# Patient Record
Sex: Male | Born: 1960
Health system: Southern US, Community
[De-identification: ages and names within clinical notes are randomized; demographics above are authoritative.]

## PROBLEM LIST (undated history)

## (undated) DIAGNOSIS — K2970 Gastritis, unspecified, without bleeding: Secondary | ICD-10-CM

## (undated) DIAGNOSIS — K589 Irritable bowel syndrome without diarrhea: Secondary | ICD-10-CM

## (undated) DIAGNOSIS — F419 Anxiety disorder, unspecified: Secondary | ICD-10-CM

## (undated) DIAGNOSIS — R7303 Prediabetes: Secondary | ICD-10-CM

## (undated) DIAGNOSIS — D126 Benign neoplasm of colon, unspecified: Secondary | ICD-10-CM

## (undated) DIAGNOSIS — L439 Lichen planus, unspecified: Secondary | ICD-10-CM

## (undated) DIAGNOSIS — J302 Other seasonal allergic rhinitis: Secondary | ICD-10-CM

## (undated) DIAGNOSIS — K5790 Diverticulosis of intestine, part unspecified, without perforation or abscess without bleeding: Secondary | ICD-10-CM

## (undated) DIAGNOSIS — K829 Disease of gallbladder, unspecified: Secondary | ICD-10-CM

## (undated) DIAGNOSIS — G4730 Sleep apnea, unspecified: Secondary | ICD-10-CM

## (undated) DIAGNOSIS — K648 Other hemorrhoids: Secondary | ICD-10-CM

## (undated) DIAGNOSIS — E785 Hyperlipidemia, unspecified: Secondary | ICD-10-CM

## (undated) DIAGNOSIS — C61 Malignant neoplasm of prostate: Secondary | ICD-10-CM

## (undated) HISTORY — DX: Other hemorrhoids: K64.8

## (undated) HISTORY — DX: Sleep apnea, unspecified: G47.30

## (undated) HISTORY — DX: Hyperlipidemia, unspecified: E78.5

## (undated) HISTORY — PX: TONSILLECTOMY: SUR1361

## (undated) HISTORY — PX: COLONOSCOPY: SHX5424

## (undated) HISTORY — DX: Diverticulosis of intestine, part unspecified, without perforation or abscess without bleeding: K57.90

## (undated) HISTORY — DX: Gastritis, unspecified, without bleeding: K29.70

## (undated) HISTORY — PX: SEPTOPLASTY: SUR1290

## (undated) HISTORY — DX: Benign neoplasm of colon, unspecified: D12.6

## (undated) HISTORY — DX: Anxiety disorder, unspecified: F41.9

## (undated) HISTORY — DX: Disease of gallbladder, unspecified: K82.9

## (undated) HISTORY — PX: OTHER SURGICAL HISTORY: SHX169

## (undated) HISTORY — DX: Lichen planus, unspecified: L43.9

## (undated) HISTORY — DX: Irritable bowel syndrome, unspecified: K58.9

---

## 1997-12-16 ENCOUNTER — Ambulatory Visit (HOSPITAL_COMMUNITY): Admission: RE | Admit: 1997-12-16 | Discharge: 1997-12-16 | Payer: Self-pay | Admitting: Internal Medicine

## 1997-12-16 ENCOUNTER — Encounter: Payer: Self-pay | Admitting: Internal Medicine

## 1999-11-10 ENCOUNTER — Ambulatory Visit (HOSPITAL_COMMUNITY): Admission: RE | Admit: 1999-11-10 | Discharge: 1999-11-10 | Payer: Self-pay | Admitting: Internal Medicine

## 1999-11-10 ENCOUNTER — Encounter: Payer: Self-pay | Admitting: Internal Medicine

## 2001-04-01 ENCOUNTER — Ambulatory Visit (HOSPITAL_COMMUNITY): Admission: RE | Admit: 2001-04-01 | Discharge: 2001-04-01 | Payer: Self-pay | Admitting: Internal Medicine

## 2001-04-01 ENCOUNTER — Encounter: Payer: Self-pay | Admitting: Internal Medicine

## 2001-09-29 ENCOUNTER — Encounter: Admission: RE | Admit: 2001-09-29 | Discharge: 2001-09-29 | Payer: Self-pay | Admitting: Internal Medicine

## 2001-09-29 ENCOUNTER — Encounter: Payer: Self-pay | Admitting: Internal Medicine

## 2001-10-08 ENCOUNTER — Encounter: Payer: Self-pay | Admitting: Internal Medicine

## 2001-10-08 ENCOUNTER — Encounter: Admission: RE | Admit: 2001-10-08 | Discharge: 2001-10-08 | Payer: Self-pay | Admitting: Internal Medicine

## 2001-10-24 ENCOUNTER — Encounter: Payer: Self-pay | Admitting: Gastroenterology

## 2001-10-24 ENCOUNTER — Ambulatory Visit (HOSPITAL_COMMUNITY): Admission: RE | Admit: 2001-10-24 | Discharge: 2001-10-24 | Payer: Self-pay | Admitting: Gastroenterology

## 2001-10-24 ENCOUNTER — Encounter (INDEPENDENT_AMBULATORY_CARE_PROVIDER_SITE_OTHER): Payer: Self-pay | Admitting: Specialist

## 2001-10-24 HISTORY — PX: UPPER GASTROINTESTINAL ENDOSCOPY: SHX188

## 2001-11-03 ENCOUNTER — Encounter: Payer: Self-pay | Admitting: Gastroenterology

## 2001-11-03 ENCOUNTER — Ambulatory Visit (HOSPITAL_COMMUNITY): Admission: RE | Admit: 2001-11-03 | Discharge: 2001-11-03 | Payer: Self-pay | Admitting: Gastroenterology

## 2002-04-05 ENCOUNTER — Emergency Department (HOSPITAL_COMMUNITY): Admission: EM | Admit: 2002-04-05 | Discharge: 2002-04-05 | Payer: Self-pay | Admitting: Emergency Medicine

## 2003-12-16 ENCOUNTER — Ambulatory Visit: Payer: Self-pay | Admitting: Internal Medicine

## 2004-01-24 ENCOUNTER — Ambulatory Visit: Payer: Self-pay | Admitting: Internal Medicine

## 2004-02-14 ENCOUNTER — Ambulatory Visit: Payer: Self-pay | Admitting: Internal Medicine

## 2004-05-01 ENCOUNTER — Ambulatory Visit: Payer: Self-pay | Admitting: Internal Medicine

## 2005-01-01 ENCOUNTER — Ambulatory Visit: Payer: Self-pay | Admitting: Internal Medicine

## 2005-01-16 ENCOUNTER — Ambulatory Visit: Payer: Self-pay | Admitting: Internal Medicine

## 2005-01-25 ENCOUNTER — Ambulatory Visit: Payer: Self-pay | Admitting: Internal Medicine

## 2005-01-26 ENCOUNTER — Ambulatory Visit: Payer: Self-pay | Admitting: Internal Medicine

## 2005-02-06 ENCOUNTER — Ambulatory Visit: Payer: Self-pay | Admitting: *Deleted

## 2005-04-17 ENCOUNTER — Ambulatory Visit: Payer: Self-pay | Admitting: Internal Medicine

## 2005-04-20 ENCOUNTER — Ambulatory Visit: Payer: Self-pay | Admitting: Internal Medicine

## 2005-05-28 ENCOUNTER — Ambulatory Visit: Payer: Self-pay | Admitting: Internal Medicine

## 2006-02-05 DIAGNOSIS — D126 Benign neoplasm of colon, unspecified: Secondary | ICD-10-CM

## 2006-02-05 HISTORY — DX: Benign neoplasm of colon, unspecified: D12.6

## 2006-02-25 ENCOUNTER — Encounter: Admission: RE | Admit: 2006-02-25 | Discharge: 2006-02-25 | Payer: Self-pay | Admitting: Internal Medicine

## 2006-02-25 ENCOUNTER — Ambulatory Visit: Payer: Self-pay | Admitting: Internal Medicine

## 2006-05-28 ENCOUNTER — Ambulatory Visit: Payer: Self-pay | Admitting: Internal Medicine

## 2006-07-03 ENCOUNTER — Ambulatory Visit: Payer: Self-pay | Admitting: Internal Medicine

## 2006-07-03 ENCOUNTER — Encounter: Payer: Self-pay | Admitting: Internal Medicine

## 2006-07-03 LAB — CONVERTED CEMR LAB
ALT: 28 units/L (ref 0–40)
AST: 19 units/L (ref 0–37)
Albumin: 4 g/dL (ref 3.5–5.2)
Alkaline Phosphatase: 43 units/L (ref 39–117)
BUN: 13 mg/dL (ref 6–23)
Basophils Absolute: 0 10*3/uL (ref 0.0–0.1)
Basophils Relative: 0.7 % (ref 0.0–1.0)
Bilirubin, Direct: 0.1 mg/dL (ref 0.0–0.3)
CO2: 29 meq/L (ref 19–32)
Calcium: 9 mg/dL (ref 8.4–10.5)
Chloride: 107 meq/L (ref 96–112)
Cholesterol: 174 mg/dL (ref 0–200)
Creatinine, Ser: 0.8 mg/dL (ref 0.4–1.5)
Eosinophils Absolute: 0.1 10*3/uL (ref 0.0–0.6)
Eosinophils Relative: 1.6 % (ref 0.0–5.0)
GFR calc Af Amer: 133 mL/min
GFR calc non Af Amer: 110 mL/min
Glucose, Bld: 100 mg/dL — ABNORMAL HIGH (ref 70–99)
HCT: 43.8 % (ref 39.0–52.0)
HDL: 37.1 mg/dL — ABNORMAL LOW (ref >39.0)
Hemoglobin: 15.5 g/dL (ref 13.0–17.0)
Hgb A1c MFr Bld: 5.6 % (ref 4.6–6.0)
LDL Cholesterol: 114 mg/dL — ABNORMAL HIGH (ref 0–99)
Lymphocytes Relative: 29.1 % (ref 12.0–46.0)
MCHC: 35.4 g/dL (ref 30.0–36.0)
MCV: 90.7 fL (ref 78.0–100.0)
Monocytes Absolute: 0.4 10*3/uL (ref 0.2–0.7)
Monocytes Relative: 7.2 % (ref 3.0–11.0)
Neutro Abs: 3.4 10*3/uL (ref 1.4–7.7)
Neutrophils Relative %: 61.4 % (ref 43.0–77.0)
PSA: 1.86 ng/mL (ref 0.10–4.00)
Platelets: 187 10*3/uL (ref 150–400)
Potassium: 4.1 meq/L (ref 3.5–5.1)
RBC: 4.83 M/uL (ref 4.22–5.81)
RDW: 12.3 % (ref 11.5–14.6)
Sodium: 140 meq/L (ref 135–145)
TSH: 0.74 microintl units/mL (ref 0.35–5.50)
Total Bilirubin: 0.6 mg/dL (ref 0.3–1.2)
Total CHOL/HDL Ratio: 4.7
Total Protein: 6.6 g/dL (ref 6.0–8.3)
Triglycerides: 115 mg/dL (ref 0–149)
VLDL: 23 mg/dL (ref 0–40)
WBC: 5.5 10*3/uL (ref 4.5–10.5)

## 2006-08-01 ENCOUNTER — Ambulatory Visit: Payer: Self-pay | Admitting: Gastroenterology

## 2006-08-06 ENCOUNTER — Ambulatory Visit: Payer: Self-pay | Admitting: Gastroenterology

## 2006-08-12 ENCOUNTER — Encounter: Payer: Self-pay | Admitting: Internal Medicine

## 2006-08-12 ENCOUNTER — Ambulatory Visit: Payer: Self-pay | Admitting: Gastroenterology

## 2006-08-12 ENCOUNTER — Encounter: Payer: Self-pay | Admitting: Gastroenterology

## 2007-02-06 DIAGNOSIS — E785 Hyperlipidemia, unspecified: Secondary | ICD-10-CM

## 2007-02-06 HISTORY — DX: Hyperlipidemia, unspecified: E78.5

## 2007-03-12 ENCOUNTER — Ambulatory Visit: Payer: Self-pay | Admitting: Internal Medicine

## 2007-03-12 DIAGNOSIS — R930 Abnormal findings on diagnostic imaging of skull and head, not elsewhere classified: Secondary | ICD-10-CM

## 2007-03-17 ENCOUNTER — Telehealth (INDEPENDENT_AMBULATORY_CARE_PROVIDER_SITE_OTHER): Payer: Self-pay | Admitting: *Deleted

## 2007-03-21 ENCOUNTER — Encounter (INDEPENDENT_AMBULATORY_CARE_PROVIDER_SITE_OTHER): Payer: Self-pay | Admitting: *Deleted

## 2007-03-21 LAB — CONVERTED CEMR LAB
ALT: 40 units/L (ref 0–53)
Basophils Relative: 0.7 % (ref 0.0–1.0)
Bilirubin, Direct: 0.1 mg/dL (ref 0.0–0.3)
CO2: 30 meq/L (ref 19–32)
Calcium: 9.7 mg/dL (ref 8.4–10.5)
Eosinophils Relative: 1.8 % (ref 0.0–5.0)
GFR calc Af Amer: 102 mL/min
Glucose, Bld: 80 mg/dL (ref 70–99)
HCT: 45.7 % (ref 39.0–52.0)
Hemoglobin: 16.1 g/dL (ref 13.0–17.0)
Lymphocytes Relative: 45.5 % (ref 12.0–46.0)
Monocytes Absolute: 0.5 10*3/uL (ref 0.2–0.7)
Neutro Abs: 2 10*3/uL (ref 1.4–7.7)
Platelets: 211 10*3/uL (ref 150–400)
Total Protein: 7.2 g/dL (ref 6.0–8.3)
WBC: 4.7 10*3/uL (ref 4.5–10.5)

## 2007-03-28 ENCOUNTER — Encounter: Payer: Self-pay | Admitting: Internal Medicine

## 2007-03-29 ENCOUNTER — Encounter: Payer: Self-pay | Admitting: Gastroenterology

## 2007-03-31 ENCOUNTER — Encounter: Payer: Self-pay | Admitting: Internal Medicine

## 2007-05-02 ENCOUNTER — Ambulatory Visit: Payer: Self-pay | Admitting: Internal Medicine

## 2007-05-03 LAB — CONVERTED CEMR LAB: Potassium: 5.1 meq/L (ref 3.5–5.1)

## 2007-05-05 ENCOUNTER — Encounter (INDEPENDENT_AMBULATORY_CARE_PROVIDER_SITE_OTHER): Payer: Self-pay | Admitting: *Deleted

## 2007-05-07 ENCOUNTER — Ambulatory Visit: Payer: Self-pay | Admitting: Internal Medicine

## 2007-05-07 DIAGNOSIS — F411 Generalized anxiety disorder: Secondary | ICD-10-CM | POA: Insufficient documentation

## 2007-05-15 ENCOUNTER — Telehealth (INDEPENDENT_AMBULATORY_CARE_PROVIDER_SITE_OTHER): Payer: Self-pay | Admitting: *Deleted

## 2007-05-21 ENCOUNTER — Ambulatory Visit: Payer: Self-pay | Admitting: Internal Medicine

## 2007-06-07 DIAGNOSIS — K5730 Diverticulosis of large intestine without perforation or abscess without bleeding: Secondary | ICD-10-CM | POA: Insufficient documentation

## 2007-07-15 ENCOUNTER — Ambulatory Visit: Payer: Self-pay | Admitting: Internal Medicine

## 2007-07-15 DIAGNOSIS — G4733 Obstructive sleep apnea (adult) (pediatric): Secondary | ICD-10-CM | POA: Insufficient documentation

## 2007-07-24 ENCOUNTER — Ambulatory Visit: Payer: Self-pay | Admitting: Internal Medicine

## 2007-07-30 ENCOUNTER — Encounter (INDEPENDENT_AMBULATORY_CARE_PROVIDER_SITE_OTHER): Payer: Self-pay | Admitting: *Deleted

## 2007-07-30 LAB — CONVERTED CEMR LAB
ALT: 38 units/L (ref 0–53)
AST: 22 units/L (ref 0–37)
Bilirubin, Direct: 0.1 mg/dL (ref 0.0–0.3)
Total Bilirubin: 0.9 mg/dL (ref 0.3–1.2)

## 2007-12-12 ENCOUNTER — Ambulatory Visit: Payer: Self-pay | Admitting: Internal Medicine

## 2007-12-12 DIAGNOSIS — E781 Pure hyperglyceridemia: Secondary | ICD-10-CM | POA: Insufficient documentation

## 2007-12-18 ENCOUNTER — Ambulatory Visit: Payer: Self-pay | Admitting: Internal Medicine

## 2007-12-30 ENCOUNTER — Encounter (INDEPENDENT_AMBULATORY_CARE_PROVIDER_SITE_OTHER): Payer: Self-pay | Admitting: *Deleted

## 2007-12-30 LAB — CONVERTED CEMR LAB
LDL Cholesterol: 122 mg/dL — ABNORMAL HIGH (ref 0–99)
Total CHOL/HDL Ratio: 5.1
Triglycerides: 120 mg/dL (ref 0–149)
VLDL: 24 mg/dL (ref 0–40)

## 2008-01-21 ENCOUNTER — Telehealth (INDEPENDENT_AMBULATORY_CARE_PROVIDER_SITE_OTHER): Payer: Self-pay | Admitting: *Deleted

## 2008-02-25 ENCOUNTER — Encounter: Payer: Self-pay | Admitting: Internal Medicine

## 2008-05-28 ENCOUNTER — Telehealth (INDEPENDENT_AMBULATORY_CARE_PROVIDER_SITE_OTHER): Payer: Self-pay | Admitting: *Deleted

## 2008-11-22 ENCOUNTER — Encounter: Payer: Self-pay | Admitting: Internal Medicine

## 2008-12-24 ENCOUNTER — Ambulatory Visit: Payer: Self-pay | Admitting: Internal Medicine

## 2009-01-19 ENCOUNTER — Telehealth (INDEPENDENT_AMBULATORY_CARE_PROVIDER_SITE_OTHER): Payer: Self-pay | Admitting: *Deleted

## 2009-02-28 ENCOUNTER — Telehealth (INDEPENDENT_AMBULATORY_CARE_PROVIDER_SITE_OTHER): Payer: Self-pay | Admitting: *Deleted

## 2009-06-08 ENCOUNTER — Ambulatory Visit: Payer: Self-pay | Admitting: Internal Medicine

## 2009-06-08 DIAGNOSIS — E782 Mixed hyperlipidemia: Secondary | ICD-10-CM | POA: Insufficient documentation

## 2009-06-08 DIAGNOSIS — Z86010 Personal history of colonic polyps: Secondary | ICD-10-CM

## 2009-06-08 DIAGNOSIS — R7301 Impaired fasting glucose: Secondary | ICD-10-CM | POA: Insufficient documentation

## 2009-06-08 DIAGNOSIS — M545 Low back pain, unspecified: Secondary | ICD-10-CM | POA: Insufficient documentation

## 2009-06-08 DIAGNOSIS — E785 Hyperlipidemia, unspecified: Secondary | ICD-10-CM

## 2009-06-08 DIAGNOSIS — D239 Other benign neoplasm of skin, unspecified: Secondary | ICD-10-CM | POA: Insufficient documentation

## 2009-06-13 ENCOUNTER — Ambulatory Visit: Payer: Self-pay | Admitting: Internal Medicine

## 2009-06-13 DIAGNOSIS — J3089 Other allergic rhinitis: Secondary | ICD-10-CM

## 2009-06-13 DIAGNOSIS — J302 Other seasonal allergic rhinitis: Secondary | ICD-10-CM

## 2009-06-23 ENCOUNTER — Encounter: Payer: Self-pay | Admitting: Internal Medicine

## 2009-06-27 ENCOUNTER — Encounter: Payer: Self-pay | Admitting: Internal Medicine

## 2009-12-01 ENCOUNTER — Ambulatory Visit: Payer: Self-pay | Admitting: Internal Medicine

## 2009-12-01 DIAGNOSIS — R198 Other specified symptoms and signs involving the digestive system and abdomen: Secondary | ICD-10-CM

## 2009-12-01 DIAGNOSIS — K219 Gastro-esophageal reflux disease without esophagitis: Secondary | ICD-10-CM

## 2009-12-01 LAB — CONVERTED CEMR LAB
Ketones, urine, test strip: NEGATIVE
Specific Gravity, Urine: 1.01

## 2009-12-26 ENCOUNTER — Telehealth (INDEPENDENT_AMBULATORY_CARE_PROVIDER_SITE_OTHER): Payer: Self-pay | Admitting: *Deleted

## 2010-01-03 ENCOUNTER — Ambulatory Visit: Payer: Self-pay | Admitting: Internal Medicine

## 2010-01-03 DIAGNOSIS — L509 Urticaria, unspecified: Secondary | ICD-10-CM

## 2010-01-05 ENCOUNTER — Telehealth: Payer: Self-pay | Admitting: Gastroenterology

## 2010-02-05 DIAGNOSIS — K648 Other hemorrhoids: Secondary | ICD-10-CM

## 2010-02-05 HISTORY — DX: Other hemorrhoids: K64.8

## 2010-02-15 ENCOUNTER — Ambulatory Visit
Admission: RE | Admit: 2010-02-15 | Discharge: 2010-02-15 | Payer: Self-pay | Source: Home / Self Care | Attending: Gastroenterology | Admitting: Gastroenterology

## 2010-02-16 ENCOUNTER — Telehealth: Payer: Self-pay | Admitting: Gastroenterology

## 2010-02-20 ENCOUNTER — Telehealth (INDEPENDENT_AMBULATORY_CARE_PROVIDER_SITE_OTHER): Payer: Self-pay | Admitting: *Deleted

## 2010-03-03 ENCOUNTER — Telehealth (INDEPENDENT_AMBULATORY_CARE_PROVIDER_SITE_OTHER): Payer: Self-pay | Admitting: *Deleted

## 2010-03-05 LAB — CONVERTED CEMR LAB
ALT: 34 units/L (ref 0–53)
AST: 24 units/L (ref 0–37)
Albumin: 4.3 g/dL (ref 3.5–5.2)
Alkaline Phosphatase: 48 units/L (ref 39–117)
Alkaline Phosphatase: 57 units/L (ref 39–117)
Basophils Absolute: 0 10*3/uL (ref 0.0–0.1)
Basophils Relative: 0.4 % (ref 0.0–3.0)
Bilirubin, Direct: 0.1 mg/dL (ref 0.0–0.3)
Bilirubin, Direct: 0.1 mg/dL (ref 0.0–0.3)
CO2: 31 meq/L (ref 19–32)
Calcium: 9 mg/dL (ref 8.4–10.5)
Cholesterol, target level: 200 mg/dL
Creatinine, Ser: 0.8 mg/dL (ref 0.4–1.5)
Eosinophils Absolute: 0.1 10*3/uL (ref 0.0–0.7)
Eosinophils Absolute: 0.1 10*3/uL (ref 0.0–0.7)
Eosinophils Relative: 1.3 % (ref 0.0–5.0)
GFR calc Af Amer: 115 mL/min
GFR calc non Af Amer: 95 mL/min
HCT: 42.6 % (ref 39.0–52.0)
HDL: 50.1 mg/dL (ref >39.00)
Hemoglobin: 14.9 g/dL (ref 13.0–17.0)
LDL Cholesterol: 101 mg/dL — ABNORMAL HIGH (ref 0–99)
LDL Goal: 130 mg/dL
Lymphocytes Relative: 32.6 % (ref 12.0–46.0)
MCHC: 34.9 g/dL (ref 30.0–36.0)
MCHC: 35 g/dL (ref 30.0–36.0)
MCV: 91.6 fL (ref 78.0–100.0)
Monocytes Absolute: 0.5 10*3/uL (ref 0.1–1.0)
Monocytes Relative: 7 % (ref 3.0–12.0)
Monocytes Relative: 8.2 % (ref 3.0–12.0)
Neutro Abs: 3.7 10*3/uL (ref 1.4–7.7)
Neutrophils Relative %: 58.7 % (ref 43.0–77.0)
Platelets: 212 10*3/uL (ref 150–400)
Potassium: 4.5 meq/L (ref 3.5–5.1)
RBC: 4.6 M/uL (ref 4.22–5.81)
RDW: 12.2 % (ref 11.5–14.6)
Sodium: 139 meq/L (ref 135–145)
Sodium: 142 meq/L (ref 135–145)
TSH: 0.92 microintl units/mL (ref 0.35–5.50)
Total Bilirubin: 0.9 mg/dL (ref 0.3–1.2)
Total CHOL/HDL Ratio: 3
Total Protein: 6.6 g/dL (ref 6.0–8.3)
WBC: 6.3 10*3/uL (ref 4.5–10.5)

## 2010-03-07 NOTE — Progress Notes (Signed)
Summary: Triage   Phone Note Call from Patient Call back at Work Phone [phone]   Call For: Dr. Arlyce Dice Reason for Call: Talk to Nurse Summary of Call: Pt wants to be seen sooner than January, he is having abd pain in left side, blood in stool, bloating, and gerd, has been to primary doctor twice for these problems with no relief Initial call taken by: [place] Johnson,  January 05, 2010 9:41 AM  Follow-up for Phone Call        Spoke with patient, he c/o some acid reflux, GERD, bloating, and some stool incontinence. Would like a sooner appointment with Arlyce Dice. Informed patient that we did not have a sooner appointment. Offered an appointment with the mid-level but patient declined. States he will wait to see Dr. Arlyce Dice. Would like to be placed on the wait list. Selinda Michaels RN  January 05, 2010 11:17 AM

## 2010-03-07 NOTE — Assessment & Plan Note (Signed)
Summary: fungus-toes/ibs??/cbs   Vital Signs:  Patient profile:   50 year old male Weight:      253.2 pounds Temp:     99.2 degrees F oral Pulse rate:   84 / minute Resp:     16 per minute BP sitting:   118 / 80  (left arm) Cuff size:   large  Vitals Entered By: Shonna Chock (Jun 08, 2009 4:34 PM) CC: 1.) Fungus-toe nail x a long time (patient unable to remember how long)  2.)Pain in left buttocks x 6 months  3.)? IBS- paitent states it feels like his colon hurts (off/on x several weeks) Comments REVIEWED MED LIST, PATIENT AGREED DOSE AND INSTRUCTION CORRECT    CC:  1.) Fungus-toe nail x a long time (patient unable to remember how long)  2.)Pain in left buttocks x 6 months  3.)? IBS- paitent states it feels like his colon hurts (off/on x several weeks).  History of Present Illness: ? fungal changes in L great toenail ? x 2 years following trauma with loss of pior  nail. PMH oflocal surgery by  Podiatrist for ingrown toe nails. Old chart reviewed ; mild fasting hyperglycemia. No FH of DM. PMH of ESI X 1 for LBP 2009.LBP has recurred @ R LS area , worse positionally.  Allergies: 1)  ! Pcn 2)  ! Sulfa 3)  ! Cipro 4)  ! 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 GU:  Denies incontinence. Derm:  Denies poor wound healing. Neuro:  Denies numbness and tingling; No radicular pain. Endo:  Denies excessive hunger, excessive thirst, and excessive urination.  Physical Exam  General:  well-nourished,in no acute distress; alert,appropriate and cooperative  throughout examination Pulses:  dorsalis pedis and posterior tibial pulses are full and equal bilaterally Extremities:  No clubbing, cyanosis, edema. Discolored L great toenail. Neg SLR Neurologic:  strength normal in all extremities, gait normal, and DTRs symmetrical and normal.   Skin:  Intact without suspicious lesions or rashes   Impression & Recommendations:  Problem # 1:  BENIGN NEOPLASM OF SKIN SITE UNSPECIFIED (ICD-216.9)   post traumatic loss of nail; discoloration of new nail, R/O fungal etiology  Orders: Podiatry Referral (Podiatry)  Problem # 2:  LOW BACK PAIN SYNDROME (ICD-724.2)  recurrent  His updated medication list for this problem includes:    Cyclobenzaprine Hcl 5 Mg Tabs (Cyclobenzaprine hcl) .Marland Kitchen... 1-2 at bedtime as needed back pain  Problem # 3:  HYPERGLYCEMIA, FASTING (ICD-790.29) A1c normal  Complete Medication List: 1)  Claritin 10 Mg Tabs (Loratadine) .Marland Kitchen.. 1 once daily as needed 2)  Lorazepam 0.5 Mg Tabs (Lorazepam) .Marland Kitchen.. 1 q 8 hrs as needed stress 3)  Effexor Xr 150 Mg Xr24h-cap (Venlafaxine hcl) .Marland Kitchen.. 1 by mouth once daily 4)  Cyclobenzaprine Hcl 5 Mg Tabs (Cyclobenzaprine hcl) .Marland Kitchen.. 1-2 at bedtime as needed back pain 5)  Gabapentin 100 Mg Caps (Gabapentin) .Marland Kitchen.. 1 every 8 hrs as needed for nerve pain  Patient Instructions: 1)  Consider stretching exercises as discussed Prescriptions: GABAPENTIN 100 MG CAPS (GABAPENTIN) 1 every 8 hrs as needed for nerve pain  #30 x 0   Entered and Authorized by:  Marga Melnick MD   Signed by:   Marga Melnick MD on 06/08/2009   Method used:   Faxed to ...       [facility]  [address] [phone][facility] (retail)       [address], [place]  [address]       Ph: [phone] or [phone]       Fax: [phone]   RxID:   [id number] CYCLOBENZAPRINE HCL 5 MG TABS (CYCLOBENZAPRINE HCL) 1-2 at bedtime as needed back pain  #20 x 0   Entered and Authorized by:   Marga Melnick MD   Signed by:   Marga Melnick  MD on 06/08/2009   Method used:   Faxed to ...       [facility]  [address] [phone]* (retail)       [address]       [place], [place]  [address]       Ph: [phone] or [phone]       Fax: [phone]   RxID:   [id number]

## 2010-03-07 NOTE — Medication Information (Signed)
Summary: Generic Request for Effexor/[facility]  Generic Request for Effexor/[facility]   Imported By: Lanelle Bal 06/30/2009 11:21:24  _____________________________________________________________________  External Attachment:    Type:   Image     Comment:   External Document

## 2010-03-07 NOTE — Progress Notes (Signed)
Summary: refill  Phone Note Refill Request Message from:  Fax from Pharmacy on December 26, 2009 11:30 AM  Refills Requested: Medication #1:  LORAZEPAM 0.5 MG  TABS 1 q 8 hrs as needed [facility] [phone] - tel [phone];  Initial call taken by: Okey Regal [place],  December 26, 2009 11:31 AM    Prescriptions: LORAZEPAM 0.5 MG  TABS (LORAZEPAM) 1 q 8 hrs as needed stress  #30 x 1   Entered by:   Shonna Chock CMA   Authorized by:   Marga Melnick MD   Signed by:   Shonna Chock CMA on 12/26/2009   Method used:   Printed then faxed to ...       [facility]  [address] [phone][facility] (retail)       [address]       [place], [place]  [address]       Ph: [phone] or [phone]       Fax: [phone]   RxID:   [id number]

## 2010-03-07 NOTE — Assessment & Plan Note (Signed)
Summary: cpx - lab/cbs   Vital Signs:  Patient profile:   50 year old male Height:      72.5 inches Weight:      259.4 pounds Temp:     98.9 degrees F oral Pulse rate:   76 / minute Resp:     14 per minute BP sitting:   104 / 80  (left arm) Cuff size:   large  Vitals Entered By: Shonna Chock (Jun 13, 2009 8:17 AM)  CC: Lipid Management Comments REVIEWED MED LIST, PATIENT AGREED DOSE AND INSTRUCTION CORRECT    CC:  Lipid Management.  History of Present Illness: Russell Thomas is here for a physical; back symptoms better with Gabapentin & generic Flexeril.  Lipid Management History:      Positive NCEP/ATP III risk factors include male age 14 years old or older, HDL cholesterol less than 40, and current tobacco user.  Negative NCEP/ATP III risk factors include non-diabetic, no family history for ischemic heart disease, non-hypertensive, no ASHD (atherosclerotic heart disease), no prior stroke/TIA, no peripheral vascular disease, and no history of aortic aneurysm.     Preventive Screening-Counseling & Management  Alcohol-Tobacco     Smoking Status: current  Caffeine-Diet-Exercise     Does Patient Exercise: no  Allergies: 1)  ! Pcn 2)  ! Sulfa 3)  ! Cipro 4)  ! 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) ,EMPTY SELLA SYNDROME GALL BLADDER POLYPS; FASTING HYPERGLYCEMIA (790.29) Hyperlipidemia: LDL goal = < 130 Colonic polyps, hx of Allergic rhinitis Diverticulosis, colon  Past Surgical History: septoplasty;thumb surgery Colon polypectomy 2008, Dr Arlyce Dice , due 2013 Upper Endoscopy :chronic  gastritis 2003, Dr Arlyce Dice Tonsillectomy  Family History: Father: negative Mother: thyroid disease Siblings: negative; no premature CAD  Social History: Occupation: Administrator, sports Married Current Smoker: rare cigar Alcohol use-yes: occasion wine or beer Regular exercise-no Smoking  Status:  current Does Patient Exercise:  no  Review of Systems  The patient denies anorexia, fever, vision loss, decreased hearing, chest pain, syncope, dyspnea on exertion, peripheral edema, prolonged cough, headaches, hemoptysis, abdominal pain, melena, hematochezia, severe indigestion/heartburn, suspicious skin lesions, unusual weight change, abnormal bleeding, enlarged lymph nodes, and angioedema.         Weight up 12#. GU:  Denies decreased libido, discharge, dysuria, erectile dysfunction, and hematuria. MS:  Denies muscle weakness. Psych:  Denies anxiety, depression, easily angered, easily tearful, and irritability; Some previous  stress with  job change , now stable. He wishes to wean effexor. Allergy:  Complains of itching eyes, seasonal allergies, and sneezing; Also hoarseness seasonally.  Physical Exam  General:  well-nourished; alert,appropriate and cooperative throughout examination Head:  Normocephalic and atraumatic without obvious abnormalities. No apparent alopecia  Eyes:  No corneal or conjunctival inflammation noted. EOMI. Perrla. Field of  Vision grossly normal. Ears:  External ear exam shows no significant lesions or deformities.  Otoscopic examination reveals clear canals, tympanic membranes are intact bilaterally without bulging, retraction, inflammation or discharge. Hearing is grossly normal bilaterally.Some wax on L Nose:  External nasal examination shows no deformity or inflammation. Nasal mucosa are pink and moist without lesions or exudates. Septum slightly to R Mouth:  Oral mucosa and oropharynx without lesions or exudates.  Teeth in good repair. Neck:  No deformities, masses, or tenderness noted. Lungs:  Normal respiratory effort, chest expands symmetrically. Lungs are clear to auscultation, no crackles or wheezes. Heart:  Normal rate and regular  rhythm. S1 and S2 normal without gallop, murmur, click, rub .Heart sounds slightly distant Abdomen:  Bowel sounds  positive,abdomen soft and non-tender without masses, organomegaly or hernias noted. Rectal:  No external abnormalities noted. Normal sphincter tone. No rectal masses or tenderness. Genitalia:  Testes bilaterally descended without nodularity, tenderness or masses. No scrotal masses or lesions. No penis lesions or urethral discharge. Prostate:  Prostate gland firm and smooth, no enlargement, nodularity, tenderness, mass, asymmetry or induration. Msk:  No deformity or scoliosis noted of thoracic or lumbar spine.   Pulses:  R and L carotid,radial,dorsalis pedis and posterior tibial pulses are full and equal bilaterally Extremities:  No clubbing, cyanosis, edema, or deformity noted with normal full range of motion of all joints.   Crepitus of knees Neurologic:  alert & oriented X3 and DTRs symmetrical and normal.   Skin:  Intact without suspicious lesions or rashes Cervical Nodes:  No lymphadenopathy noted Axillary Nodes:  No palpable lymphadenopathy Inguinal Nodes:  No significant adenopathy Psych:  memory intact for recent and remote, normally interactive, and good eye contact.     Impression & Recommendations:  Problem # 1:  ROUTINE GENERAL MEDICAL EXAM@[email or web address]  CARE FACL (ICD-V70.0)  Orders: EKG w/ Interpretation (93000) Venipuncture (16109) TLB-Lipid Panel (80061-LIPID) TLB-BMP (Basic Metabolic Panel-BMET) (80048-METABOL) TLB-CBC Platelet - w/Differential (85025-CBCD) TLB-Hepatic/Liver Function Pnl (80076-HEPATIC) TLB-TSH (Thyroid Stimulating Hormone) (84443-TSH) TLB-PSA (Prostate Specific Antigen) (84153-PSA)  Problem # 2:  LOW BACK PAIN SYNDROME (ICD-724.2) improved His updated medication list for this problem includes:    Cyclobenzaprine Hcl 5 Mg Tabs (Cyclobenzaprine hcl) .Marland Kitchen... 1-2 at bedtime as needed back pain  Problem # 3:  HYPERLIPIDEMIA (ICD-272.4)  Orders: EKG w/ Interpretation (93000) Venipuncture (60454) TLB-Lipid Panel (80061-LIPID)  Problem # 4:  SLEEP APNEA  (ICD-780.57) controlled  Problem # 5:  COLONIC POLYPS, HX OF (ICD-V12.72) as per Dr Arlyce Dice  Problem # 6:  ANXIETY STATE, UNSPECIFIED (ICD-300.00) Stable His updated medication list for this problem includes:    Lorazepam 0.5 Mg Tabs (Lorazepam) .Marland Kitchen... 1 q 8 hrs as needed stress  Problem # 7:  SPECIAL SCREENING MALIGNANT NEOPLASM OF PROSTATE (ICD-V76.44)  Orders: Venipuncture (09811) TLB-PSA (Prostate Specific Antigen) (84153-PSA)  Complete Medication List: 1)  Claritin 10 Mg Tabs (Loratadine) .Marland Kitchen.. 1 once daily as needed 2)  Lorazepam 0.5 Mg Tabs (Lorazepam) .Marland Kitchen.. 1 q 8 hrs as needed stress 3)  Effexor Xr 75 Mg Xr24h-cap (venlafaxine Hcl)  .Marland Kitchen.. 1 by mouth once daily 4)  Cyclobenzaprine Hcl 5 Mg Tabs (Cyclobenzaprine hcl) .Marland Kitchen.. 1-2 at bedtime as needed back pain 5)  Gabapentin 100 Mg Caps (Gabapentin) .Marland Kitchen.. 1 every 8 hrs as needed for nerve pain  Lipid Assessment/Plan:      Based on NCEP/ATP III, the patient's risk factor category is "2 or more risk factors and a calculated 10 year CAD risk of < 20%".  The patient's lipid goals are as follows: Total cholesterol goal is 200; LDL cholesterol goal is 130; HDL cholesterol goal is 40; Triglyceride goal is 150.  His LDL cholesterol goal has been met.    Patient Instructions: 1)  Your LDL goal = < 130. Vitamin D3 1000 IU once daily . Prescriptions: EFFEXOR XR 75  MG XR24H-CAP (VENLAFAXINE HCL) 1 by mouth once daily  #30 x 5   Entered and Authorized by:   Marga Melnick MD   Signed by:   Marga Melnick MD on 06/13/2009   Method used:   Print then Give to Patient   RxID:   [id number]  Appended Document: cpx - lab/cbs  Laboratory Results   Urine Tests   Date/Time Reported: Jun 13, 2009 10:47 AM   Routine Urinalysis   Color: yellow Appearance: Clear Glucose: negative   (Normal Range: Negative) Bilirubin: negative   (Normal Range: Negative) Ketone: negative   (Normal Range: Negative) Spec. Gravity: 1.015   (Normal  Range: 1.003-1.035) Blood: negative   (Normal Range: Negative) pH: 7.0   (Normal Range: 5.0-8.0) Protein: negative   (Normal Range: Negative) Urobilinogen: negative   (Normal Range: 0-1) Nitrite: negative   (Normal Range: Negative) Leukocyte Esterace: negative   (Normal Range: Negative)    Comments: Floydene Flock  Jun 13, 2009 10:47 AM

## 2010-03-07 NOTE — Assessment & Plan Note (Signed)
Summary: STOMACH PROBLEMS, LL BACK HURTS FOR WEEKS///[facility]   Vital Signs:  Patient profile:   50 year old male Weight:      261.6 pounds BMI:     35.12 Temp:     98.5 degrees F oral Pulse rate:   64 / minute Resp:     15 per minute BP sitting:   124 / 80  (left arm) Cuff size:   large  Vitals Entered By: Shonna Chock CMA (December 01, 2009 4:12 PM) CC: 1.) Stomach discomfort- left lower area and diarrhea   2.) Left lower back pain and frequent urination , Back pain, Abdominal pain   CC:  1.) Stomach discomfort- left lower area and diarrhea   2.) Left lower back pain and frequent urination , Back pain, and Abdominal pain.  History of Present Illness: Back Pain      This is a 50 year old man who presents with Back pain for > 6 months.  The patient reports rest pain, but denies fever, chills, weakness, loss of sensation, fecal incontinence, urinary incontinence, urinary retention. He has had polyuria & some  dysuria.  The pain is located in the left low back.  The pain began gradually and w/o injury  The pain radiates to the left  inguinal area.  The pain  has no triggers or relievers . ESI X 2 in 2008 helped , but ESI  8 weeks  ago of no benefit. Risk factors for serious underlying conditions include duration of pain > 1 month and age >= 52 years.  Rx: NSAIDS  but GI intolerance. Abdominal Pain      The patient also presents with Abdominal pain for 4 weeks.  The patient reports loose stool &  diarrhea and  some anorexia, but denies nausea, vomiting, constipation, melena, hematochezia, and hematemesis.  The location of the pain is left upper quadrant.  The pain is described as intermittent, dull, and radiating to the back.  The patient denies the following symptoms: weight loss, jaundice, and dark urine.  The pain is better with an H2 blocker. EGD 2003 2003 : gastritis, Dr Arlyce Dice.   Current Medications (verified): 1)  Claritin 10 Mg Tabs (Loratadine) .Marland Kitchen.. 1 Once Daily As Needed 2)  Lorazepam  0.5 Mg  Tabs (Lorazepam) .Marland Kitchen.. 1 Q 8 Hrs As Needed Stress 3)  Effexor Xr 75  Mg Xr24h-Cap (Venlafaxine Hcl) .Marland Kitchen.. 1 By Mouth Once Daily 4)  Cyclobenzaprine Hcl 5 Mg Tabs (Cyclobenzaprine Hcl) .Marland Kitchen.. 1-2 At Bedtime As Needed Back Pain 5)  Gabapentin 100 Mg Caps (Gabapentin) .Marland Kitchen.. 1 Every 8 Hrs As Needed For Nerve Pain 6)  Pepcid Ac 10 Mg Tabs (Famotidine) .Marland Kitchen.. 1 By Mouth Once Daily As Needed  Allergies: 1)  ! Pcn 2)  ! Sulfa 3)  ! Cipro 4)  ! * Cephlosporins  Physical Exam  General:  in no acute distress; alert,appropriate and cooperative throughout examination Eyes:  No corneal or conjunctival inflammation noted. No icterus Mouth:  Oral mucosa and oropharynx without lesions or exudates.  Teeth in good repair. No pharyngeal erythema.   Lungs:  Normal respiratory effort, chest expands symmetrically. Lungs are clear to auscultation, no crackles or wheezes. Heart:  Normal rate and regular rhythm. S1 and S2 normal without gallop, murmur, click, rub or other extra sounds. Abdomen:  Bowel sounds positive,abdomen soft and non-tender without masses, organomegaly or hernias noted. Msk:  He lay back & sat up w/o help Pulses:  R and L carotid,radial,dorsalis pedis and posterior  tibial pulses are full and equal bilaterally Extremities:  No clubbing, cyanosis, edema, or deformity noted with normal full range of motion of all joints. Neg SLR    Neurologic:  alert & oriented X3, strength normal in all extremities, and DTRs symmetrical and normal.   Skin:  Intact without suspicious lesions or rashes Cervical Nodes:  No lymphadenopathy noted Axillary Nodes:  No palpable lymphadenopathy Psych:  memory intact for recent and remote, normally interactive, and good eye contact.     Impression & Recommendations:  Problem # 1:  LOW BACK PAIN SYNDROME (ICD-724.2)  His updated medication list for this problem includes:    Cyclobenzaprine Hcl 5 Mg Tabs (Cyclobenzaprine hcl) .Marland Kitchen... 1-2 at bedtime as needed back  pain    Tramadol Hcl 50 Mg Tabs (Tramadol hcl) .Marland Kitchen... 1 every 6 hrs as needed for pain  Problem # 2:  ABDOMINAL PAIN, LEFT UPPER QUADRANT (ICD-789.02) PMH of gastritis  Problem # 3:  GERD (ICD-530.81)  His updated medication list for this problem includes:    Pepcid Ac 10 Mg Tabs (Famotidine) .Marland Kitchen... 1 by mouth once daily as needed    Omeprazole 20 Mg Cpdr (Omeprazole) .Marland Kitchen... 1 two times a day pre meals  Problem # 4:  CHANGE IN BOWELS (ICD-787.99) loose stool  Complete Medication List: 1)  Claritin 10 Mg Tabs (Loratadine) .Marland Kitchen.. 1 once daily as needed 2)  Lorazepam 0.5 Mg Tabs (Lorazepam) .Marland Kitchen.. 1 q 8 hrs as needed stress 3)  Cyclobenzaprine Hcl 5 Mg Tabs (Cyclobenzaprine hcl) .Marland Kitchen.. 1-2 at bedtime as needed back pain 4)  Gabapentin 100 Mg Caps (Gabapentin) .Marland Kitchen.. 1 every 8 hrs as needed for nerve pain 5)  Pepcid Ac 10 Mg Tabs (Famotidine) .Marland Kitchen.. 1 by mouth once daily as needed 6)  Tramadol Hcl 50 Mg Tabs (Tramadol hcl) .Marland Kitchen.. 1 every 6 hrs as needed for pain 7)  Omeprazole 20 Mg Cpdr (Omeprazole) .Marland Kitchen.. 1 two times a day pre meals  Other Orders: UA Dipstick w/o Micro (manual) (16109)  Patient Instructions: 1)  Align once daily if bowels are loose.Report symptom response  in 4 days  2)  Avoid foods high in acid (tomatoes, citrus juices, spicy foods). Avoid eating within two hours of lying down or before exercising. Do not over eat; try smaller more frequent meals. Elevate head of bed twelve inches when sleeping. Prescriptions: OMEPRAZOLE 20 MG CPDR (OMEPRAZOLE) 1 two times a day pre meals  #60 x 1   Entered and Authorized by:   Marga Melnick MD   Signed by:   Marga Melnick MD on 12/01/2009   Method used:   Print then Give to Patient   RxID:   [id number] TRAMADOL HCL 50 MG TABS (TRAMADOL HCL) 1 every 6 hrs as needed for pain  #30 x 1   Entered and Authorized by:   Marga Melnick MD   Signed by:   Marga Melnick MD on 12/01/2009   Method used:   Print then Give to Patient   RxID:    [id number]    Orders Added: 1)  UA Dipstick w/o Micro (manual) [81002] 2)  Est. Patient Level IV [29528]    Laboratory Results   Urine Tests    Routine Urinalysis   Color: yellow Appearance: Clear Glucose: negative   (Normal Range: Negative) Bilirubin: negative   (Normal Range: Negative) Ketone: negative   (Normal Range: Negative) Spec. Gravity: 1.010   (Normal Range: 1.003-1.035) Blood: negative   (Normal Range: Negative) pH: 7.0   (Normal Range:  5.0-8.0) Protein: negative   (Normal Range: Negative) Urobilinogen: 0.2   (Normal Range: 0-1) Nitrite: negative   (Normal Range: Negative) Leukocyte Esterace: negative   (Normal Range: Negative)

## 2010-03-07 NOTE — Assessment & Plan Note (Signed)
Summary: ABD & BACK PAIN NO BETTER/RH......Marland Kitchen   Vital Signs:  Patient profile:   50 year old male Weight:      263.0 pounds BMI:     35.31 Temp:     98.4 degrees F oral Pulse rate:   72 / minute Resp:     17 per minute BP sitting:   122 / 76  (left arm) Cuff size:   large  Vitals Entered By: Shonna Chock CMA (January 03, 2010 8:57 AM) CC: Ongoing abdominal and back pain. New concerns: Bright red blood with BM yesterday x 1 , patient had BM today and no blood . Patient was seen on 12/25/2009 in the emergency room for rash-? related to Omeprazole (changed to ranitidine), Abdominal pain, Rash Comments Patient's father was DX with Ulcerative Colitis-early 70's    CC:  Ongoing abdominal and back pain. New concerns: Bright red blood with BM yesterday x 1 , patient had BM today and no blood . Patient was seen on 12/25/2009 in the emergency room for rash-? related to Omeprazole (changed to ranitidine), Abdominal pain, and Rash.  History of Present Illness: Abdominal Pain      This is a 50 year old man who presents with Abdominal pain.  The patient denies nausea, vomiting, diarrhea, constipation, melena, and anorexia. He had blood on stool ,on tissue & in toilet water 11/28, not today. The location of the pain is left upper quadrant.  The pain is described as dull to sharp &  cramping in quality, and radiating to the back.  The patient denies the following symptoms: fever, weight loss, dysuria, chest pain, jaundice, and dark urine.  The pain is worse with food, no specific food  trigger .  The pain is better with an H2 blocker.    Rash      The patient also  had a rash 11/20 for which he was given steroid injection @  [facility] in [place]. PPI (Omeprazole ) was changed to Ranitidine 150 mg  twice a day. Hydroxyzine was also Rxed.  The patient  describes the rash as  hives, welts, and itching, but  he denied  blisters, weeping, and oozing.  The rash is located on the neck, groin, thorax  and  both  hands.  The patient denies the following symptoms: facial swelling, tongue swelling, and arthralgias.    Current Medications (verified): 1)  Claritin 10 Mg Tabs (Loratadine) .Marland Kitchen.. 1 Once Daily As Needed 2)  Lorazepam 0.5 Mg  Tabs (Lorazepam) .Marland Kitchen.. 1 Q 8 Hrs As Needed Stress 3)  Cyclobenzaprine Hcl 5 Mg Tabs (Cyclobenzaprine Hcl) .Marland Kitchen.. 1-2 At Bedtime As Needed Back Pain 4)  Gabapentin 100 Mg Caps (Gabapentin) .Marland Kitchen.. 1 Every 8 Hrs As Needed For Nerve Pain 5)  Tramadol Hcl 50 Mg Tabs (Tramadol Hcl) .Marland Kitchen.. 1 Every 6 Hrs As Needed For Pain 6)  Ranitidine Hcl 150 Mg Tabs (Ranitidine Hcl) .Marland Kitchen.. 1 By Mouth Every 12 Hours X 14 Days 7)  Prednisone 20 Mg Tabs (Prednisone) .... 2 By Mouth Once Daily X 5days (Started On 01/01/2010) 8)  Hydroxyzine Pamoate 50 Mg Caps (Hydroxyzine Pamoate) .Marland Kitchen.. 1 By Mouth Every 6 Hours As Needed  Allergies: 1)  ! Pcn 2)  ! Sulfa 3)  ! Cipro 4)  ! 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 GI:  Stool incontinence improved with probiotic. Psych:  Complains of anxiety and depression; denies easily angered, easily tearful, and irritability; As per his wife he is under extreme pressure. Allergy:  Denies itching eyes and sneezing.  Physical Exam  General:  well-nourished,in no acute distress; alert,appropriate and cooperative throughout examination Eyes:  No corneal or conjunctival inflammation noted. No icterus  Mouth:  Oral mucosa and oropharynx without lesions or exudates.  Teeth in good repair. Lungs:  Normal respiratory effort, chest expands symmetrically. Lungs are clear to auscultation, no crackles or wheezes. Heart:  Normal rate  and regular rhythm. S1 and S2 normal without gallop, murmur, click, rub or other extra sounds. Abdomen:  Bowel sounds positive,abdomen soft and non-tender without masses, organomegaly or hernias noted. Msk:  No deformity or scoliosis noted of thoracic or lumbar spine.   No tenderness to percussion or thorax compression. He lay back & sat up w/o help Pulses:  R and L carotid,radial pulses are full and equal bilaterally Extremities:  No clubbing, cyanosis, edema. Neg SLR Neurologic:  alert & oriented X3, strength normal in all extremities, and DTRs symmetrical and normal.   Skin:  Faint erythematous rash L lateral thorax which blanches with pressure. Dermatographia exhibited Cervical Nodes:  No lymphadenopathy noted Axillary Nodes:  No palpable lymphadenopathy Psych:  memory intact for recent and remote, normally interactive, and good eye contact.     Impression & Recommendations:  Problem # 1:  ABDOMINAL PAIN, LEFT UPPER QUADRANT (ICD-789.02)  Problem # 2:  URTICARIA (ICD-708.9)  Orders: Gastroenterology Referral (GI)  Problem # 3:  CHANGE IN BOWELS (ZOX-096.04)  improved with Probiotic  Orders: Gastroenterology Referral (GI)  Problem # 4:  GERD (ICD-530.81)  The following medications were removed from the medication list:    Pepcid Ac 10 Mg Tabs (Famotidine) .Marland Kitchen... 1 by mouth once daily as needed His updated medication list for this problem includes:    Ranitidine Hcl 150 Mg Tabs (Ranitidine hcl) .Marland Kitchen... 1 by mouth every 12 hours x 14 days    Clidinium-chlordiazepoxide 2.5-5 Mg Caps (Clidinium-chlordiazepoxide) .Marland Kitchen... 1 every 6 hrs as needed for abdominal pain  Orders: Gastroenterology Referral (GI)  Problem # 5:  ANXIETY STATE, UNSPECIFIED (ICD-300.00)  His updated medication list for this problem includes:    Lorazepam 0.5 Mg Tabs (Lorazepam) .Marland Kitchen... 1 q 8 hrs as needed stress    Hydroxyzine Pamoate 50 Mg Caps (Hydroxyzine pamoate) .Marland Kitchen... 1 by mouth every 6 hours as  needed  Complete Medication List: 1)  Claritin 10 Mg Tabs (Loratadine) .Marland Kitchen.. 1 once daily as needed 2)  Lorazepam 0.5 Mg Tabs (Lorazepam) .Marland Kitchen.. 1 q 8 hrs as needed stress 3)  Cyclobenzaprine Hcl 5 Mg Tabs (Cyclobenzaprine hcl) .Marland Kitchen.. 1-2 at bedtime as needed back pain 4)  Gabapentin 100 Mg Caps (Gabapentin) .Marland Kitchen.. 1 every 8 hrs as needed for nerve pain 5)  Tramadol Hcl 50 Mg Tabs (Tramadol hcl) .Marland Kitchen.. 1 every 6 hrs as needed for pain 6)  Ranitidine Hcl 150 Mg Tabs (Ranitidine hcl) .Marland Kitchen.. 1 by mouth every 12 hours x 14 days 7)  Prednisone 20 Mg Tabs (Prednisone) .... 2 by mouth once daily x 5days (started on 01/01/2010) 8)  Hydroxyzine Pamoate 50 Mg Caps (Hydroxyzine pamoate) .Marland Kitchen.. 1 by mouth every 6 hours as needed 9)  Clidinium-chlordiazepoxide 2.5-5 Mg Caps (Clidinium-chlordiazepoxide) .Marland Kitchen.. 1 every 6 hrs as needed for abdominal pain  Patient  Instructions: 1)  Ranitidine 150 mg two times a day pre meals. 2)  Avoid foods high in acid (tomatoes, citrus juices, spicy foods). Avoid eating within two hours of lying down or before exercising. Do not over eat; try smaller more frequent meals. Elevate head of bed twelve inches when sleeping. Prescriptions: CLIDINIUM-CHLORDIAZEPOXIDE 2.5-5 MG CAPS (CLIDINIUM-CHLORDIAZEPOXIDE) 1 every 6 hrs as needed for abdominal pain  #30 x 1   Entered and Authorized by:   Marga Melnick MD   Signed by:   Marga Melnick MD on 01/03/2010   Method used:   Print then Give to Patient   RxID:   [id number]    Orders Added: 1)  Est. Patient Level IV [41324] 2)  Gastroenterology Referral [GI]

## 2010-03-07 NOTE — Consult Note (Signed)
Summary: [facility]   Imported By: Lanelle Bal 07/09/2009 10:00:32  _____________________________________________________________________  External Attachment:    Type:   Image     Comment:   External Document

## 2010-03-07 NOTE — Progress Notes (Signed)
Summary: REFILL  Phone Note Refill Request Message from:  Fax from Pharmacy on [facility] [address] FAX [phone]  PATIENT HAS A RX FOR EFFEXOR XR 75MG  AND SAYS IT NEEDS TO BE INCREASED TO XR 150MG .PLEASE CONFIRM AND SEND A NEW RX IF THIS IS WHAT IS PRESCRIBED. THANKS  Initial call taken by: Barb Merino,  February 28, 2009 9:04 AM  Follow-up for Phone Call        Left message on machine for patient to return call when avaliable, Reason for call:   Refill Concerna: Med not on list Follow-up by: Shonna Chock,  February 28, 2009 1:23 PM    New/Updated Medications: EFFEXOR XR 150 MG XR24H-CAP (VENLAFAXINE HCL) 1 by mouth once daily Prescriptions: EFFEXOR XR 150 MG XR24H-CAP (VENLAFAXINE HCL) 1 by mouth once daily  #30 x 3   Entered by:   Shonna Chock   Authorized by:   Marga Melnick MD   Signed by:   Shonna Chock on 03/01/2009   Method used:   Electronically to        [facility]* (retail)       [address] [place], [place]  [address]       Ph: [phone]       Fax: [phone]   RxID:   [id number]

## 2010-03-09 NOTE — Assessment & Plan Note (Signed)
Summary: URTICARIA.LLQ ABD PAIN/CHANGE IN BM & GERD/YF    History of Present Illness Visit Type: Initial Consult Primary GI MD: Melvia Heaps MD [facility] Primary Provider: Marga Melnick, mD Requesting Provider: Marga Melnick, MD Chief Complaint: LLQ pain, GERD,change in bowel habits x 6 months History of Present Illness:   Mr. Russell Thomas is a pleasant 50-year white male referred at the request of Dr. Alwyn Ren for evaluation of reflux symptoms and stool incontinence.  He has been complaining of frequent pyrosis despite taking ranitidine intermittently.  He has abdominal bloating  He denies dysphagia or odynophagia.   There is no history of coughing or hoarseness.  He also complains of stool incontinence consisting of staining of his underwear.  On only one occasion he seen limited rectal bleeding.  He denies rectal or abdominal pain.  Colonoscopy 2008 demonstrated a small adenomatous polyp,  and diverticulosis.   GI Review of Systems    Reports abdominal pain, acid reflux, belching, bloating, and  weight gain.     Location of  Abdominal pain: LLQ.    Denies chest pain, dysphagia with liquids, dysphagia with solids, heartburn, loss of appetite, nausea, vomiting, vomiting blood, and  weight loss.      Reports change in bowel habits, diarrhea, and  fecal incontinence.     Denies anal fissure, black tarry stools, constipation, diverticulosis, heme positive stool, hemorrhoids, irritable bowel syndrome, jaundice, light color stool, liver problems, rectal bleeding, and  rectal pain.    Current Medications (verified): 1)  Claritin 10 Mg Tabs (Loratadine) .Marland Kitchen.. 1 Once Daily As Needed 2)  Lorazepam 0.5 Mg  Tabs (Lorazepam) .Marland Kitchen.. 1 Q 8 Hrs As Needed Stress 3)  Cyclobenzaprine Hcl 5 Mg Tabs (Cyclobenzaprine Hcl) .Marland Kitchen.. 1-2 At Bedtime As Needed Back Pain 4)  Ranitidine Hcl 150 Mg Tabs (Ranitidine Hcl) .Marland Kitchen.. 1 By Mouth Every 12 Hours X 14 Days  Allergies: 1)  ! Pcn 2)  ! Sulfa 3)  ! Cipro 4)  ! *  Cephlosporins 5)  ! * Omeprazole  Past History:  Past Medical History: Reviewed history from 01/03/2010 and no changes required. 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 septoplasty;thumb surgery Colon polypectomy 2008, Dr Arlyce Dice , due 2013 Upper Endoscopy :chronic  gastritis 2003, Dr Arlyce Dice Tonsillectomy  Family History: Father: negative Mother: thyroid disease Siblings: negative; no premature CAD Family History of Colitis/Crohn's:Father  Social History: Reviewed history from 06/13/2009 and no changes required. Occupation: Administrator, sports Married Current Smoker: rare cigar Alcohol use-yes: occasion wine or beer Regular exercise-no  Review of Systems       The patient complains of back pain and sleeping problems.  The patient denies allergy/sinus, anemia, anxiety-new, arthritis/joint pain, blood in urine, breast changes/lumps, change in vision, confusion, cough, coughing up blood, depression-new, fainting, fatigue, fever, headaches-new, hearing problems, heart murmur, heart rhythm changes, itching, menstrual pain, muscle pains/cramps, night sweats, nosebleeds, pregnancy symptoms, shortness of breath, skin rash, sore throat, swelling of feet/legs, swollen lymph glands, thirst - excessive , urination - excessive , urination changes/pain, urine leakage, vision changes, and voice change.         All other systems were reviewed and were negative   Vital Signs:  Patient profile:   50 year old male Height:  73 inches Weight:      271.13 pounds BMI:     35.90 Pulse rate:   96 / minute Pulse rhythm:   regular BP sitting:   104 / 76  (left arm) Cuff size:   large  Vitals  Entered By: June McMurray CMA Duncan Dull) (February 15, 2010 8:23 AM)  Physical Exam  Additional Exam:  On physical exam he is a well-developed well-nourished male  skin: anicter  HEENT: normocephalic; PEERLA; no nasal or orpharyngeal abnormalities neck: supple nodes: no cervical adenopathy chest: clear cor:  no murmurs, gallops or rubs abd:  bowel sounds normoactive; no abdominal masses, tenderness, organomegaly rectal: no masses; stool heme negative ext: no cyanosis, clubbing, or edema skeletal: no gross skeletal abnormalities neuro: alert, oriented x 3; no focal abnormalities    Impression & Recommendations:  Problem # 1:  GERD (ICD-530.81) Patient remains symptomatic despite taking H2 receptor antagonist intermittently.  Recommendations #1 trial protonix 40 mg daily #2 antireflux measures  Problem # 2:  CHANGE IN BOWELS (BJY-782.95) The patient reports loose stools in association with ingesting greasy or fried foods.  He has had mild smearing of stool in his underclothes most likely related to hemorrhoids.  Recommendations #1 trial of Anusol HC suppositories #2 if symptoms are not improved with medical therapy then I will schedule him for a sigmoidoscopy  Problem # 3:  PERSONAL HISTORY OF COLONIC POLYPS (ICD-V12.72) Plan followup colonoscopy in 2013  Patient Instructions: 1)  Copy sent to : Marga Melnick, MD 2)  Avoid foods high in acid content ( tomatoes, citrus juices, spicy foods) . Avoid eating within 3 to 4 hours of lying down or before exercising. Do not over eat; try smaller more frequent meals. Elevate head of bed four inches when sleeping.  3)  GI Reflux brochure given.  4)  Dr Arlyce Dice will send an rx to your pharmacy. 5)  Please schedule a follow-up appointment in 1 month.  6)  The medication list was reviewed and reconciled.  All changed / newly prescribed medications were explained.  A complete medication list was provided to the patient /  caregiver. Prescriptions: ANUSOL-HC 25 MG  SUPP (HYDROCORTISONE ACETATE) take one at bedtime  #10 x 1   Entered by:   Harlow Mares CMA (AAMA)   Authorized by:   Louis Meckel MD   Signed by:   Harlow Mares CMA (AAMA) on 02/15/2010   Method used:   Electronically to        [facility] [phone]* (retail)       [address] [place], [place]  [address]       Ph: [phone] or [phone]       Fax: [phone]   RxID:   [id number] PROTONIX 40 MG SOLR (PANTOPRAZOLE SODIUM) take one tab before breakfast daily  #30 x 2   Entered by:   Harlow Mares CMA (AAMA)   Authorized by:   Louis Meckel MD   Signed by:   Harlow Mares CMA (AAMA) on 02/15/2010   Method used:   Electronically to        [facility]  [address] [facility] [phone][facility] (retail)       [address] [place], [place]  [address]       Ph: [phone] or [phone]       Fax:  [phone]   RxID:   [id number] ANUSOL-HC 25 MG  SUPP (HYDROCORTISONE ACETATE) take one at bedtime  #10 x 1   Entered and Authorized by:   Louis Meckel MD   Signed by:   Louis Meckel MD on 02/15/2010   Method used:   Historical   RxID:   [id number] PROTONIX 40 MG SOLR (PANTOPRAZOLE SODIUM) take one tab before breakfast daily  #30 x 2   Entered and Authorized by:   Louis Meckel MD   Signed by:   Louis Meckel MD on 02/15/2010   Method used:   Historical   RxID:   [id number]

## 2010-03-09 NOTE — Progress Notes (Signed)
Summary: Prilosec caused allergic reaction   Phone Note From Pharmacy   [phone] Call For: Dr Arlyce Dice  Reason for Call: Patient requests substitution Summary of Call: Severe reaction to Prilosec that put him in the hospital last time he used it. Initial call taken by: Leanor Kail [facility],  February 16, 2010 9:46 AM  Follow-up for Phone Call        Spoke wtih pharmacy, the pharmacist states that the patient has a severe allergy to Sulfa and all of the PPI drugs contain sulfa. She looked at all of the package inserts and they all contain sulfa. Asking for something else to be called in for patient to use. Dr. Arlyce Dice please advise. Follow-up by: Selinda Michaels RN,  February 16, 2010 11:23 AM  Additional Follow-up for Phone Call Additional follow up Details #1::        Patient would like to also be notified. Leanor Kail [facility]  February 16, 2010 1:23 PM    Additional Follow-up for Phone Call Additional follow up Details #2::    switch to ranitidine 300mg  bid Follow-up by: Louis Meckel MD,  February 17, 2010 10:32 AM  Additional Follow-up for Phone Call Additional follow up Details #3:: Details for Additional Follow-up Action Taken: [phone] pt would like to speak with a nurse about the problems with his medication Additional Follow-up by: [place] Johnson,  February 20, 2010 9:15 AM  Message left to call back.  Appended Document: Prilosec caused allergic reaction Pt. will stop all P.P.I's and start Zantac.

## 2010-03-09 NOTE — Progress Notes (Signed)
   Phone Note Other Incoming   Summary of Call: Pt. informed to stop all P.P.I's due to sulfa content and he will start Zantac. Initial call taken by: Teryl Lucy RN,  February 20, 2010 1:33 PM

## 2010-03-15 NOTE — Progress Notes (Signed)
Summary: Refill   Phone Note Refill Request Message from:  Fax from Pharmacy on March 03, 2010 8:53 AM  Refills Requested: Medication #1:  HYDROXYZINE PAM 50MG  1Q6H PRN ANXIETY  Medication #2:  LORAZEPAM 0.5 MG  TABS 1 q 8 hrs as needed stress [facility] fax [phone] - phone [phone]  Initial call taken by: Okey Regal [place],  March 03, 2010 8:57 AM  Follow-up for Phone Call        Left message with patient's wife to have patient return call when avaliable. Medication # 1 not on med list, removed when last seen by Dr.Kaplan Follow-up by: Shonna Chock CMA,  March 03, 2010 3:29 PM  Additional Follow-up for Phone Call Additional follow up Details #1::        Left message for patient to call to discuss med #1, not on med list, ? if patient needs refill and why removed  Additional Follow-up by: Shonna Chock CMA,  March 07, 2010 1:09 PM    Additional Follow-up for Phone Call Additional follow up Details #2::    Left message on machine for patient to return call when avaliable, Reason for call:  Discuss Med #1 Chrae Malloy CMA,  March 08, 2010 8:19 AM  I spoke with patient and he indicated that he would still like to take Hydroxyzine as needed. He had ran out of his rx and thats why he had Dr.Kaplans office remove.  I spoke with Dr.Hopper and he okd med to be added back to Med list with the indication "DO NOT take with Lorazepam" #30, 0refills./Chrae Malloy CMA  March 09, 2010 9:41 AM   New/Updated Medications: HYDROXYZINE PAMOATE 50 MG CAPS (HYDROXYZINE PAMOATE) 1 by mouth as needed ONLY (DO NOT TAKE WITH LORAZEPAM) Prescriptions: HYDROXYZINE PAMOATE 50 MG CAPS (HYDROXYZINE PAMOATE) 1 by mouth as needed ONLY (DO NOT TAKE WITH LORAZEPAM)  #30 x 0   Entered by:   Shonna Chock CMA   Authorized by:   Marga Melnick MD   Signed by:   Shonna Chock CMA on 03/09/2010   Method used:   [facility]ed to ...       [facility]  [address] [phone]       [address], [place]  [address]       Ph: [phone] or [phone]       Fax: [phone]   RxID:   [id number] LORAZEPAM 0.5 MG  TABS (LORAZEPAM) 1 q 8 hrs as needed stress  #30 x 1   Entered by:   Shonna Chock CMA   Authorized by:   Marga Melnick MD   Signed by:   Shonna Chock CMA on 03/03/2010   Method used:   Printed then faxed to ...       [facility]  [address] [facility] [phone][facility] ([facility]ail)       [address]       [place], [place]  [address]       Ph: [phone] or [phone]       Fax: [phone]   RxID:   [id number]

## 2010-03-24 ENCOUNTER — Encounter: Payer: Self-pay | Admitting: Gastroenterology

## 2010-03-24 ENCOUNTER — Ambulatory Visit (INDEPENDENT_AMBULATORY_CARE_PROVIDER_SITE_OTHER): Payer: PRIVATE HEALTH INSURANCE | Admitting: Gastroenterology

## 2010-03-24 DIAGNOSIS — Z86010 Personal history of colonic polyps: Secondary | ICD-10-CM

## 2010-03-24 DIAGNOSIS — K648 Other hemorrhoids: Secondary | ICD-10-CM

## 2010-03-24 DIAGNOSIS — K219 Gastro-esophageal reflux disease without esophagitis: Secondary | ICD-10-CM

## 2010-03-29 NOTE — Letter (Signed)
Summary: [facility] Medical Instructions  [facility]  [address] [place], [place] [address]   Phone: [phone]  Fax: [phone]       Russell Thomas    08-20-1960    MRN: [id number]        Procedure Day /Date:MONDAY 04/10/2010     Arrival Time:8AM     Procedure Time:9AM     Location of Procedure:                     X  [facility] ( Outpatient Registration)   PREPARATION FOR COLONOSCOPY WITH MOVIPREP   Starting 5 days prior to your procedure2/28/2012 do not eat nuts, seeds, popcorn, corn, beans, peas,  salads, or any raw vegetables.  Do not take any fiber supplements (e.g. Metamucil, Citrucel, and Benefiber).  THE DAY BEFORE YOUR PROCEDURE         DATE: 04/09/2010  DAY: SUNDAY  1.  Drink clear liquids the entire day-NO SOLID FOOD  2.  Do not drink anything colored red or purple.  Avoid juices with pulp.  No orange juice.  3.  Drink at least 64 oz. (8 glasses) of fluid/clear liquids during the day to prevent dehydration and help the prep work efficiently.  CLEAR LIQUIDS INCLUDE: Water Jello Ice Popsicles Tea (sugar ok, no milk/cream) Powdered fruit flavored drinks Coffee (sugar ok, no milk/cream) Gatorade Juice: apple, white grape, white cranberry  Lemonade Clear bullion, consomm, broth Carbonated beverages (any kind) Strained chicken noodle soup Hard Candy                             4.  In the morning, mix first dose of MoviPrep solution:    Empty 1 Pouch A and 1 Pouch B into the disposable container    Add lukewarm drinking water to the top line of the container. Mix to dissolve    Refrigerate (mixed solution should be used within 24 hrs)  5.  Begin drinking the prep at 5:00 p.m. The MoviPrep container is divided by 4 marks.   Every 15 minutes drink the solution down to the next mark (approximately 8 oz) until the full liter is complete.   6.  Follow completed prep with 16 oz of clear liquid of your choice (Nothing red or purple).   Continue to drink clear liquids until bedtime.  7.  Before going to bed, mix second dose of MoviPrep solution:    Empty 1 Pouch A and 1 Pouch B into the disposable container    Add lukewarm drinking water to the top line of the container. Mix to dissolve    Refrigerate  THE DAY OF YOUR PROCEDURE      DATE: 04/10/2010  DAY: MONDAY  Beginning at 4 a.m. (5 hours before procedure):         1. Every 15 minutes, drink the solution down to the next mark (approx 8 oz) until the full liter is complete.  2. Follow completed prep with 16 oz. of clear liquid of your choice.    3. You may drink clear liquids until 5AM OURS BEFORE PROCEDURE).   MEDICATION INSTRUCTIONS  Unless otherwise instructed, you should take regular prescription medications with a small sip of water   as early as possible the morning of your procedure.         OTHER INSTRUCTIONS  You will need a responsible adult at least 52 years of age to accompany  you and drive you home.   This person must remain in the waiting room during your procedure.  Wear loose fitting clothing that is easily removed.  Leave jewelry and other valuables at home.  However, you may wish to bring a book to read or  an iPod/MP3 player to listen to music as you wait for your procedure to start.  Remove all body piercing jewelry and leave at home.  Total time from sign-in until discharge is approximately 2-3 hours.  You should go home directly after your procedure and rest.  You can resume normal activities the  day after your procedure.  The day of your procedure you should not:   Drive   Make legal decisions   Operate machinery   Drink alcohol   Return to work  You will receive specific instructions about eating, activities and medications before you leave.    The above instructions have been reviewed and explained to me by   _______________________    I fully understand and can verbalize these instructions  _____________________________ Date _________

## 2010-03-29 NOTE — Assessment & Plan Note (Signed)
Summary: 1 MONTH FU APPT..LSW    History of Present Illness Visit Type: Follow-up Visit Primary GI MD: Melvia Heaps MD [facility] Primary Provider: Oren Bracket Requesting Provider: n/a Chief Complaint: Patient here for f/u . He complains of continued intermittent GERD and fecal incontinence. History of Present Illness:    Mr. Lenn has returned for followup of his pyrosis and stool incontinence. He is unable to take PPIs because of the severe allergy to sulfa. In the past he is developed blistering and severe pruritus with rash.  While taking over-the-counter Zantac.  he has  hadpersistent pyrosis. Rectal incontinence has improved with suppositories though it remains.    Patient is due  for followup colonoscopy in 2013 because of a history of an adenomatous polyp.   GI Review of Systems    Reports acid reflux, belching, bloating, and  heartburn.      Denies abdominal pain, chest pain, dysphagia with liquids, dysphagia with solids, loss of appetite, nausea, vomiting, vomiting blood, weight loss, and  weight gain.      Reports diarrhea, fecal incontinence, and  rectal bleeding.     Denies anal fissure, black tarry stools, change in bowel habit, constipation, diverticulosis, heme positive stool, hemorrhoids, irritable bowel syndrome, jaundice, light color stool, liver problems, and  rectal pain.    Current Medications (verified): 1)  Claritin 10 Mg Tabs (Loratadine) .Marland Kitchen.. 1 Once Daily As Needed 2)  Lorazepam 0.5 Mg  Tabs (Lorazepam) .Marland Kitchen.. 1 Q 8 Hrs As Needed Stress 3)  Cyclobenzaprine Hcl 5 Mg Tabs (Cyclobenzaprine Hcl) .Marland Kitchen.. 1-2 At Bedtime As Needed Back Pain 4)  Ranitidine Hcl 150 Mg Tabs (Ranitidine Hcl) .Marland Kitchen.. 1 By Mouth Every 12 Hours X 14 Days 5)  Hydroxyzine Pamoate 50 Mg Caps (Hydroxyzine Pamoate) .Marland Kitchen.. 1 By Mouth As Needed Only (Do Not Take With Lorazepam)  Allergies (verified): 1)  ! Pcn 2)  ! Sulfa 3)  ! Cipro 4)  ! * Cephlosporins 5)  ! * Omeprazole  Past History:  Past  Medical History: Reviewed history from 01/03/2010 and no changes required. 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 Occupation: Administrator, sports Married Current Smoker: rare cigar Alcohol use-yes: occasion wine or beer Regular exercise-no  Review of Systems       The patient complains of allergy/sinus, anxiety-new, arthritis/joint pain, and swelling of feet/legs.  The patient denies anemia, back pain, blood in urine, breast changes/lumps, change in vision, confusion, cough, coughing up blood, depression-new, fainting, fatigue, fever, headaches-new, hearing problems, heart murmur, heart rhythm changes, itching, menstrual pain, muscle pains/cramps, night sweats, nosebleeds, pregnancy symptoms, shortness of breath, skin rash, sleeping problems, sore throat, swollen lymph glands, thirst - excessive , urination - excessive , urination changes/pain, urine leakage, vision changes, and voice change.    Vital Signs:  Patient profile:   50 year old male Height:      73 inches Weight:      269.50 pounds BMI:     35.68 BSA:     2.44 Pulse rate:   76 /  minute Pulse rhythm:   regular BP sitting:   112 / 80  (left arm)  Vitals Entered By: Lamona Curl CMA Duncan Dull) (March 24, 2010 8:58 AM)   Impression & Recommendations:  Problem # 1:  GERD (ICD-530.81)  Plan to increase Zantac to 150 mg every  morning. Patient was instructed to take a second dose as necessary. If symptoms are persistent at this dose he can increase it to 300 mg one to 2 times a day  Problem # 2:  CHANGE IN BOWELS (ICD-787.99)   I suspect the patient's symptoms of mild incontinence is due to hemorrhoidal disease.  He has had a partial response to suppositories  Recommendations  #1 colonoscopy  since the patient is due for followup colonoscopy in one year. If no abnormalities are found to explain his incontinence I will proceed with band ligation of his hemorrhoids.  Orders: ZCOL Banding (ZCOL Band)  Problem # 3:  PERSONAL HISTORY OF COLONIC POLYPS (ICD-V12.72)  See assessment #2  Orders: ZCOL Banding (ZCOL Band)  Patient Instructions: 1)  Copy sent to : William Hopper,MD 2)  Your Colonoscopy is scheduled at [facility] on 04/10/2010 3)  We have sent in your new rx to your pharmacy today 4)  Colonoscopy and Flexible Sigmoidoscopy brochure given.  5)  Conscious Sedation brochure given.  6)  The medication list was reviewed and reconciled.  All changed / newly prescribed medications were explained.  A complete medication list was provided to the patient / caregiver. Prescriptions: MOVIPREP 100 GM  SOLR (PEG-KCL-NACL-NASULF-NA ASC-C) As per prep instructions.  #1 x 0   Entered by:   Merri Ray CMA (AAMA)   Authorized by:   Louis Meckel MD   Signed by:   Merri Ray CMA (AAMA) on 03/24/2010   Method used:   Electronically to        [facility]  [address] [phone][facility] (retail)       [address] [place], [place]  [address]       Ph: [phone] or [phone]       Fax: [phone]   RxID:   [id number] ZANTAC 150 MG TABS (RANITIDINE HCL) take one tablet one to 2 times a day  #60 x 5   Entered and Authorized by:   Louis Meckel MD   Signed by:   Louis Meckel MD on 03/24/2010   Method used:   Electronically to        [facility]  [address] [phone]* (retail)       [address]       [place], [place]  [address]       Ph: [phone] or [phone]       Fax: [phone]   RxID:   [id number]

## 2010-04-04 NOTE — Procedures (Signed)
Summary: Instructions for procedure/Galateo  Instructions for procedure/Randall   Imported By: Sherian Rein 03/27/2010 15:00:40  _____________________________________________________________________  External Attachment:    Type:   Image     Comment:   External Document

## 2010-04-10 ENCOUNTER — Other Ambulatory Visit: Payer: PRIVATE HEALTH INSURANCE | Admitting: Gastroenterology

## 2010-04-10 ENCOUNTER — Ambulatory Visit (HOSPITAL_COMMUNITY)
Admission: RE | Admit: 2010-04-10 | Discharge: 2010-04-10 | Disposition: A | Discharge status: Home / Self Care | Payer: PRIVATE HEALTH INSURANCE | Source: Ambulatory Visit | Attending: Gastroenterology | Admitting: Gastroenterology

## 2010-04-10 ENCOUNTER — Encounter: Payer: Self-pay | Admitting: Gastroenterology

## 2010-04-10 DIAGNOSIS — R159 Full incontinence of feces: Secondary | ICD-10-CM | POA: Insufficient documentation

## 2010-04-10 DIAGNOSIS — Z860100 Personal history of colon polyps, unspecified: Secondary | ICD-10-CM

## 2010-04-10 DIAGNOSIS — K648 Other hemorrhoids: Secondary | ICD-10-CM

## 2010-04-12 LAB — HM COLONOSCOPY

## 2010-04-18 NOTE — Procedures (Signed)
Summary: Colonoscopy  Patient: Russell Thomas Note: All result statuses are Final unless otherwise noted.  Tests: (1) Colonoscopy (COL)   COL Colonoscopy           DONE (C)     [facility]     [address] [place], [place]  [address]          COLONOSCOPY PROCEDURE REPORT          PATIENT:  Russell Thomas, Russell Thomas  MR#:  [id number]     BIRTHDATE:  03/20/1960, 50 yrs. old  GENDER:  male     ENDOSCOPIST:  Barbette Hair. Arlyce Dice, MD     REF. BY:  Marga Melnick, M.D.     PROCEDURE DATE:  04/10/2010     PROCEDURE:  Colon w/ banding hemorrhoi     ASA CLASS:  Class II     INDICATIONS:  fecal incontinence, history of pre-cancerous     (adenomatous) colon polyps Index polypectomy 2008     MEDICATIONS:   Fentanyl 100 mcg, Versed 10 mg, Benadryl 25 mg          DESCRIPTION OF PROCEDURE:   After the risks benefits and     alternatives of the procedure were thoroughly explained, informed     consent was obtained.  Digital rectal exam was performed and     revealed no abnormalities.   The [id number]) endoscope was     introduced through the anus and advanced to the cecum, which was     identified by the ileocecal valve, without limitations.  The     quality of the prep was good, using MoviPrep.  The instrument was     then slowly withdrawn as the colon was fully examined.     <<PROCEDUREIMAGES>>          FINDINGS:  Internal Hemorrhoids were found (see image9). Small     internal hemorrhoids banding 7 bands were placed circumferentially     above the dentate line  Otherwise normal colonoscopy without other     polyps, masses, vascular ectasias, or inflammatory changes (see     image1, image2, image4, image6, and image7).   Retroflexed views     in the rectum revealed no abnormalities.    The scope was then     withdrawn from the patient and the procedure completed.          COMPLICATIONS:  None     ENDOSCOPIC IMPRESSION:     1) Internal hemorrhoids - s/p band ligation     2)  Otherwise normal colonoscopy     RECOMMENDATIONS:     1) f/u colonoscopy 7 years     2) call office next 1-3 days to schedule followup visit in 1     month     3) MAC sedation for future procedures     REPEAT EXAM:  7 years          ______________________________     Barbette Hair. Arlyce Dice, MD          CC:          n.     REVISED:  04/10/2010 11:57 AM     eSIGNED:   Barbette Hair. Treyana Sturgell at 04/10/2010 11:57 AM          Luna Glasgow, [id number]  Note: An exclamation mark (!) indicates a result that was not dispersed into the flowsheet. Document Creation Date: 04/10/2010 11:58 AM _______________________________________________________________________  (1) Order result status: Final Collection  or observation date-time: 04/10/2010 10:01 Requested date-time:  Receipt date-time:  Reported date-time:  Referring Physician:   Ordering Physician: Melvia Heaps [phone]) Specimen Source:  Source: Launa Grill Order Number: [id number] Lab site:

## 2010-04-18 NOTE — Letter (Signed)
Summary: Appt Reminder 2   G[facility]enter[facility]  [address] [place], [place] [address]   Phone: [phone]  Fax: [phone]        April 10, 2010 MRN: [id number]    Russell Thomas [address] [place], [place]  [address]    Dear Russell Thomas,   You have a return appointment with Dr. Arlyce Dice on 05/17/10 at 2:45pm.  Please remember to bring a complete list of the medicines you are taking, your insurance card and your co-pay.  If you have to cancel or reschedule this appointment, please call before 5:00 pm the evening before to avoid a cancellation fee.  If you have any questions or concerns, please call [phone].    Sincerely,    Selinda Michaels RN  Appended Document: Appt Reminder 2 Letter is mailed to the patient's home address

## 2010-05-17 ENCOUNTER — Ambulatory Visit: Payer: PRIVATE HEALTH INSURANCE | Admitting: Gastroenterology

## 2010-05-24 ENCOUNTER — Ambulatory Visit: Payer: PRIVATE HEALTH INSURANCE | Admitting: Gastroenterology

## 2010-06-20 NOTE — Assessment & Plan Note (Signed)
[facility]                         GASTROENTEROLOGY OFFICE NOTE   ALEXSIS, BRANSCOM                      MRN:          [id number]  DATE:08/01/2006                            DOB:          1960-05-06    REASON FOR CONSULTATION:  Hematochezia.   HISTORY:  Mr. Weisensel is a pleasant 50 year old white male referred  through the courtesy of Dr. Alwyn Ren for evaluation.  On at least two  occasions he has noted bright red blood per rectum consisting of bright  red blood mixed with his stools.  He denies rectal or abdominal pain.  He has noticed a change in the stool caliber, although there has been on  change in his bowel habits.   PAST MEDICAL HISTORY:  Pertinent for anxiety and depression.   FAMILY HISTORY:  Noncontributory.   MEDICATIONS:  1. Zyrtec.  2. Effexor.   ALLERGIES:  He is ALLERGIC TO SULFA, PENICILLIN, CIPRO and  CEPHALOSPORINS.   HABITS/SOCIAL HISTORY:  He doesn't smoke.  He drinks rarely.  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   REVIEW OF SYSTEMS:  Positive for occasional pyrosis, fatigue and new  depression.   PHYSICAL EXAMINATION:  VITAL SIGNS:  Pulse 72.  Blood pressure 116/80.  Weight 261.  HEENT: EOMI. PERRLA. Sclerae are anicteric.  Conjunctivae are pink.  NECK:  Supple without thyromegaly, adenopathy or carotid bruits.  CHEST:  Clear to auscultation and percussion without adventitious  sounds.  CARDIAC:  Regular rhythm; normal S1 S2.  There are no murmurs, gallops  or rubs.  ABDOMEN:  Bowel sounds are normoactive.  Abdomen is soft, non-tender and  non-distended.  There are no abdominal masses, tenderness, splenic  enlargement or hepatomegaly.  EXTREMITIES:  Full range of motion.  No cyanosis, clubbing or edema.  RECTAL:  There are no masses.  Stool is Hemoccult negative.   IMPRESSION:  Limited rectal bleeding - rule out colonic bleeding sources  including hemorrhoids, polyps, and less likely neoplasm.   RECOMMENDATION:   Colonoscopy.     Barbette Hair. Arlyce Dice, MD,FACG  Electronically Signed    RDK/MedQ  DD: 08/01/2006  DT: 08/01/2006  Job #: [id number]   cc:   Titus Dubin. Alwyn Ren, MD,FACP,FCCP

## 2010-06-20 NOTE — Letter (Signed)
August 01, 2006    Titus Dubin. Alwyn Ren, MD,FACP,FCCP  [id number] [place], [place] [address]   RE:  [name], [name]  MRN:  [id number]  /  DOB:  April 21, 1960   Dear Dr. Alwyn Ren,   Upon your kind referral, I had the pleasure of evaluating your patient  and I am pleased to offer my findings.  I saw Russell Thomas in the  office today.  Enclosed is a copy of my progress note that details my  findings and recommendations.   Thank you for the opportunity to participate in your patient's care.    Sincerely,      Barbette Hair. Arlyce Dice, MD,FACG  Electronically Signed    RDK/MedQ  DD: 08/01/2006  DT: 08/01/2006  Job #: [id number]

## 2010-07-04 ENCOUNTER — Ambulatory Visit: Payer: PRIVATE HEALTH INSURANCE | Admitting: Gastroenterology

## 2010-09-04 ENCOUNTER — Telehealth: Payer: Self-pay | Admitting: Internal Medicine

## 2010-09-04 NOTE — Telephone Encounter (Signed)
Nothing else needed

## 2010-09-05 ENCOUNTER — Ambulatory Visit (INDEPENDENT_AMBULATORY_CARE_PROVIDER_SITE_OTHER): Payer: PRIVATE HEALTH INSURANCE | Admitting: *Deleted

## 2010-09-05 DIAGNOSIS — Z23 Encounter for immunization: Secondary | ICD-10-CM

## 2010-10-05 ENCOUNTER — Other Ambulatory Visit: Payer: Self-pay | Admitting: Internal Medicine

## 2010-10-05 MED ORDER — LORAZEPAM 0.5 MG PO TABS: 0.5000 mg | ORAL_TABLET | Freq: Three times a day (TID) | ORAL | Status: DC

## 2010-10-05 NOTE — Telephone Encounter (Signed)
RX sent

## 2010-11-09 ENCOUNTER — Encounter: Payer: Self-pay | Admitting: Internal Medicine

## 2010-11-09 ENCOUNTER — Ambulatory Visit (INDEPENDENT_AMBULATORY_CARE_PROVIDER_SITE_OTHER): Payer: PRIVATE HEALTH INSURANCE | Admitting: Internal Medicine

## 2010-11-09 VITALS — BP 126/88 | HR 65 | Temp 98.7°F | Resp 12 | Ht 72.0 in | Wt 261.2 lb

## 2010-11-09 DIAGNOSIS — M25579 Pain in unspecified ankle and joints of unspecified foot: Secondary | ICD-10-CM

## 2010-11-09 DIAGNOSIS — E781 Pure hyperglyceridemia: Secondary | ICD-10-CM

## 2010-11-09 DIAGNOSIS — Z23 Encounter for immunization: Secondary | ICD-10-CM

## 2010-11-09 DIAGNOSIS — F341 Dysthymic disorder: Secondary | ICD-10-CM

## 2010-11-09 DIAGNOSIS — Z0000 Encounter for general adult medical examination without abnormal findings: Secondary | ICD-10-CM

## 2010-11-09 DIAGNOSIS — F418 Other specified anxiety disorders: Secondary | ICD-10-CM

## 2010-11-09 DIAGNOSIS — R7309 Other abnormal glucose: Secondary | ICD-10-CM

## 2010-11-09 DIAGNOSIS — Z136 Encounter for screening for cardiovascular disorders: Secondary | ICD-10-CM

## 2010-11-09 DIAGNOSIS — M25572 Pain in left ankle and joints of left foot: Secondary | ICD-10-CM

## 2010-11-09 DIAGNOSIS — E785 Hyperlipidemia, unspecified: Secondary | ICD-10-CM

## 2010-11-09 LAB — CBC WITH DIFFERENTIAL/PLATELET
Basophils Absolute: 0 10*3/uL (ref 0.0–0.1)
Basophils Relative: 0.3 % (ref 0.0–3.0)
Eosinophils Absolute: 0 10*3/uL (ref 0.0–0.7)
Lymphocytes Relative: 29.1 % (ref 12.0–46.0)
MCHC: 33.6 g/dL (ref 30.0–36.0)
MCV: 93.4 fl (ref 78.0–100.0)
Monocytes Absolute: 0.5 10*3/uL (ref 0.1–1.0)
Neutrophils Relative %: 61.9 % (ref 43.0–77.0)
Platelets: 193 10*3/uL (ref 150.0–400.0)
RBC: 4.71 Mil/uL (ref 4.22–5.81)
RDW: 13.2 % (ref 11.5–14.6)

## 2010-11-09 LAB — HEPATIC FUNCTION PANEL
AST: 19 U/L (ref 0–37)
Albumin: 4.4 g/dL (ref 3.5–5.2)
Alkaline Phosphatase: 49 U/L (ref 39–117)
Bilirubin, Direct: 0.1 mg/dL (ref 0.0–0.3)
Total Protein: 7.3 g/dL (ref 6.0–8.3)

## 2010-11-09 LAB — BASIC METABOLIC PANEL
BUN: 19 mg/dL (ref 6–23)
CO2: 28 mEq/L (ref 19–32)
Calcium: 9.2 mg/dL (ref 8.4–10.5)
Chloride: 107 mEq/L (ref 96–112)
Creatinine, Ser: 1 mg/dL (ref 0.4–1.5)
Glucose, Bld: 101 mg/dL — ABNORMAL HIGH (ref 70–99)

## 2010-11-09 LAB — LIPID PANEL
Cholesterol: 175 mg/dL (ref 0–200)
HDL: 47.7 mg/dL (ref >39.00)
Total CHOL/HDL Ratio: 4
Triglycerides: 94 mg/dL (ref 0.0–149.0)

## 2010-11-09 MED ORDER — BUPROPION HCL ER (XL) 150 MG PO TB24: 150.0000 mg | ORAL_TABLET | ORAL | Status: DC

## 2010-11-09 NOTE — Progress Notes (Signed)
Subjective:    Patient ID: Russell Thomas, male    DOB: 1960/07/08, 50 y.o.   MRN: [id number]  HPI  Russell Thomas  is here for a physical;acute issues are noted in the ROS below.      Review of Systems Patient reports no  vision/ hearing changes,anorexia, weight change, fever ,adenopathy, persistant / recurrent hoarseness, swallowing issues, chest pain,palpitations, edema,persistant / recurrent cough, hemoptysis, dyspnea(rest, exertional, paroxysmal nocturnal), gastrointestinal  bleeding (melena, rectal bleeding), abdominal pain, excessive heart burn, GU symptoms( dysuria, hematuria, pyuria, voiding/incontinence  issues) syncope, focal weakness, memory loss,or  abnormal bruising/bleeding. He describes ankle discomfort on the left involving the Achilles  tendon with  possible plantar fasciitis. No treatment to date. He has had some nail change of the  left great nail; he is using topical agents. He describes some anxiety and depression. He uses lorazepam as needed. Effexor has been beneficial but he thought it caused jaw clenching. He has had numbness in the right medial calf.    Objective:   Physical Exam Gen.:  well-nourished in appearance. Alert, appropriate and cooperative throughout exam. Head: Normocephalic without obvious abnormalities;  No  alopecia  Eyes: No corneal or conjunctival inflammation noted. Pupils equal round reactive to light and accommodation. Fundal exam is benign without hemorrhages, exudate, papilledema. Extraocular motion intact.  Ears: External  ear exam reveals no significant lesions or deformities. Canals clear .TMs normal. Hearing is grossly normal bilaterally. Nose: External nasal exam reveals no deformity or inflammation. Nasal mucosa are pink and moist. No lesions or exudates noted.   Mouth: Oral mucosa and oropharynx reveal no lesions or exudates. Teeth in good repair. Neck: No deformities, masses, or tenderness noted. Range of motion & . Thyroid normal. Lungs:  Normal respiratory effort; chest expands symmetrically. Lungs are clear to auscultation without rales, wheezes, or increased work of breathing. Heart: Normal rate and rhythm. Normal S1 and S2. No gallop, click, or rub. S 4 w/o  murmur. Abdomen: Bowel sounds normal; abdomen soft and nontender. No masses, organomegaly or hernias noted. Genitalia/DRE: Genitourinary exam is unremarkable. Prostate is normal with no enlargement, induration, or nodularity. .                                                                                   Musculoskeletal/extremities: No deformity or scoliosis noted of  the thoracic or lumbar spine. No clubbing, cyanosis, edema, or deformity noted. Range of motion  normal .Tone & strength  normal.Joints normal. Nail health: fungal changes L great toenail. There is no tenderness to percussion of the plantar fascia or palpation of the left Achilles tendon. There is some degree of pes planus  bilaterally and slight inversion of the feet. Vascular: Carotid, radial artery, dorsalis pedis and  posterior tibial pulses are full and equal. No bruits present. Neurologic: Alert and oriented x3. Deep tendon reflexes symmetrical and normal.          Skin: Intact without suspicious lesions or rashes. Lymph: No cervical, axillary, or inguinal lymphadenopathy present. Psych: Mood and affect are normal. Normally interactive  Assessment & Plan:  #1 comprehensive physical exam; no acute findings #2 see Problem List with Assessments & Recommendations  #3 fungal nail changes left great toenail  #4 left foot/ankle pain. He questions issues with his Achilles tendon and possible plantar fasciitis. It is possible his symptoms are related to the mild inversion and borderline pes planus.  #5 anxiety/depression Plan: see Orders and recommendations

## 2010-11-09 NOTE — Patient Instructions (Signed)
Preventive Health Care: Exercise at least 30-45 minutes a day,  3-4 days a week.  Eat a low-fat diet with lots of fruits and vegetables, up to 7-9 servings per day. Avoid obesity; your goal is waist measurement < 40 inches.Consume less than 40 grams of sugar per day from foods & drinks with High Fructose Corn Sugar as # 1,2,3 or # 4 on label. Health Care Power of Attorney & Living Will. Complete if not in place ; these place you in charge of your health care decisions.

## 2010-11-17 ENCOUNTER — Encounter: Payer: PRIVATE HEALTH INSURANCE | Admitting: Internal Medicine

## 2010-11-21 ENCOUNTER — Telehealth: Payer: Self-pay | Admitting: Internal Medicine

## 2010-11-21 NOTE — Telephone Encounter (Signed)
DR, HOPPER, IN REFERENCE TO PODIATRY REFERRAL, PER CALL FROM PATIENT, HE TOLD ME TO CANCEL PODIATRY APPT, HE HAD HIS FEET SCANNED, FOUND OUT HE IS "FLAT FOOTED" & HAS BOUGHT A PAIR OF $50 INSERTS THAT GO IN HIS SHOES, AND HE STATES HIS PAIN IS GONE & HE FEELS FABULOUS.  I HAVE CANCELLED APPT WITH DR. AJLOUNY.

## 2010-12-06 ENCOUNTER — Telehealth: Payer: Self-pay | Admitting: Internal Medicine

## 2010-12-06 NOTE — Telephone Encounter (Signed)
Patient requesting lab results from 11/09/10. Per patient request, mailed copy & faxed to private house fax number.

## 2010-12-06 NOTE — Telephone Encounter (Signed)
Lab for dos [id number] hasnt been addressed - patient wants labs faxed to his home today (fax [phone] & if unable to fax call him at [phone] -- patient is very demanding he said if dr hopper didn't address the lab fax it anyway his wife can read it

## 2011-01-08 ENCOUNTER — Other Ambulatory Visit: Payer: Self-pay | Admitting: Internal Medicine

## 2011-01-08 MED ORDER — LORAZEPAM 0.5 MG PO TABS: 0.5000 mg | ORAL_TABLET | Freq: Three times a day (TID) | ORAL | Status: DC

## 2011-01-08 NOTE — Telephone Encounter (Signed)
RX sent

## 2011-01-09 ENCOUNTER — Telehealth: Payer: Self-pay | Admitting: Gastroenterology

## 2011-01-09 NOTE — Telephone Encounter (Signed)
Spoke with pt and he wants to see Dr. Arlyce Dice. Explained there were no appts and he wants to be called if there is a cancellation. Offered for pt to see midlevel this week and if he cannot see Dr. Arlyce Dice he wants to see another physician when available, does not want to see midlevel.

## 2011-01-19 NOTE — Telephone Encounter (Signed)
Pt scheduled to see Dr. Leone Payor 12/17@11am . Pt aware of appt date and time.

## 2011-01-19 NOTE — Telephone Encounter (Signed)
Pt calling back and wants to see another physician since Dr. Arlyce Dice is not available.

## 2011-01-22 ENCOUNTER — Ambulatory Visit (INDEPENDENT_AMBULATORY_CARE_PROVIDER_SITE_OTHER): Payer: PRIVATE HEALTH INSURANCE | Admitting: Internal Medicine

## 2011-01-22 ENCOUNTER — Encounter: Payer: Self-pay | Admitting: Internal Medicine

## 2011-01-22 ENCOUNTER — Ambulatory Visit: Payer: PRIVATE HEALTH INSURANCE | Admitting: Internal Medicine

## 2011-01-22 VITALS — BP 118/80 | HR 68 | Ht 73.0 in | Wt 259.2 lb

## 2011-01-22 DIAGNOSIS — R159 Full incontinence of feces: Secondary | ICD-10-CM

## 2011-01-22 DIAGNOSIS — K648 Other hemorrhoids: Secondary | ICD-10-CM

## 2011-01-22 DIAGNOSIS — R195 Other fecal abnormalities: Secondary | ICD-10-CM

## 2011-01-22 MED ORDER — HYDROCORTISONE ACETATE 25 MG RE SUPP: 25.0000 mg | Freq: Every day | RECTAL | Status: AC

## 2011-01-22 MED ORDER — PSYLLIUM 58.6 % PO POWD: ORAL | Status: DC

## 2011-01-22 NOTE — Patient Instructions (Addendum)
Please go to the basement upon leaving today to have your labs done. Your prescription(s) has(have) been sent to your pharmacy for you to pick up (Anusol HC Supp). Return to see Dr. Leone Payor in 3 weeks. Take 1 tbsp of Metamucil everyday, you can purchase this over-the-counter.

## 2011-01-22 NOTE — Progress Notes (Addendum)
Subjective:    Patient ID: Russell Thomas, male    DOB: 17-Sep-1960, 50 y.o.   MRN: [id number]  HPI 1-2 year history of a brownish liquid leakage into his underwear. He had a colonoscopy and hemorrhoid ligation in March 2012. Seemed a little better after that but then back to some problems.  Bowel habits are 1 stool every 1-2 days. Less solid than years ago. Possible greasy consistency at times. Usually AM stool. He notices the leakage at the end of the day when he looks at underwear. No anal irritation.   Recently had a few days without. Has been there most days over months. No obvious triggers noted. He has not tried any medications.  Allergies  Allergen Reactions  . Cephalosporins     hives  . Ciprofloxacin     Rash   . Omeprazole     REACTION: rash  . Penicillins     rash  . Sulfonamide Derivatives     Rash    Outpatient Prescriptions Prior to Visit  Medication Sig Dispense Refill  . cyclobenzaprine (FLEXERIL) 5 MG tablet Take 5 mg by mouth 3 (three) times daily as needed.        . Loratadine (CLARITIN PO) Take by mouth.        Marland Kitchen LORazepam (ATIVAN) 0.5 MG tablet Take 1 tablet (0.5 mg total) by mouth every 8 (eight) hours.  30 tablet  2  . ranitidine (ZANTAC) 150 MG tablet Take 150 mg by mouth 2 (two) times daily.        Marland Kitchen buPROPion (WELLBUTRIN XL) 150 MG 24 hr tablet Take 1 tablet (150 mg total) by mouth every morning.  30 tablet  2  . hydrOXYzine (ATARAX) 50 MG tablet Take 50 mg by mouth 1 dose over 46 hours.         Past Medical History  Diagnosis Date  . Sleep apnea   . Hyperlipemia 2009    TG 512    . Colon polyps     Dr Arlyce Dice   . Allergy     RHINITIS  . Diverticulosis   . IBS (irritable bowel syndrome)    Past Surgical History  Procedure Date  . Septoplasty   . Thumb surgery   . Tonsillectomy   . Colonoscopy w/ polypectomy     X 2   History   Social History  . Marital Status: Married    Spouse Name: N/A    Number of Children: 1  . Years of  Education: N/A   Occupational History  . Sales    Social History Main Topics  . Smoking status: Never Smoker   . Smokeless tobacco: Never Used   Comment: 1 cigar / year  . Alcohol Use: Yes     rare ; 1-2 drinks / month  . Drug Use: No  . Sexually Active: None   Other Topics Concern  . None   Social History Narrative   NO REG EXERCISE   Family History  Problem Relation Age of Onset  . Thyroid disease Mother   . Colitis Father         Review of Systems No urinary incontinence. No significant back pain.    Objective:   Physical Exam Well-developed well-nourished no acute distress The abdomen is mildly obese soft and nontender Inspection of the anorectal area shows normal anoderm except for a small violaceous 1-2 mm papular lesion on the left perianal area. This looked benign. Anal wink is intact When the gluteal  folds are pulled apart, I could see a small streak of brown loose stool in the anterior area. There is normal resting tone on digital rectal exam. Prostate appears normal. There is loose brown stool. There is no rectal mass. Voluntary squeezes intact. Valsalva maneuver shows normal distended appropriate abdominal contraction.  Anoscopy was performed as reflected in assessment and plan below but was not documented at time of visit. Inflamed hemorrhoids seen in anal canal on anoscopic exam.     Assessment & Plan:  Chronic intermittent fecal soilage. He has smears of loose brown stool on the underwear. Examination today does not show any signs of sphincter incompetence. He does appear to have inflamed hemorrhoids in the anal canal, and these may be contributing to his problem. I'm not sure why his stool was loose, that could be an issue as well. Will check fecal elastase and fecal lactoferrin. Use Metamucil 1 tablespoon daily to see if that will bulk up the stool. Treat hemorrhoids with hydrocortisone suppositories. Reassess in 3-4 weeks.  He is using a probiotic  presently.

## 2011-02-02 ENCOUNTER — Other Ambulatory Visit: Payer: PRIVATE HEALTH INSURANCE

## 2011-02-02 DIAGNOSIS — R195 Other fecal abnormalities: Secondary | ICD-10-CM

## 2011-02-02 DIAGNOSIS — R159 Full incontinence of feces: Secondary | ICD-10-CM

## 2011-02-03 LAB — FECAL LACTOFERRIN, QUANT: Lactoferrin: NEGATIVE

## 2011-02-05 NOTE — Progress Notes (Signed)
Quick Note:  Waiting on fecal elastase ______

## 2011-02-10 LAB — PANCREATIC ELASTASE, FECAL: Pancreatic Elastase-1, Stool: 348 mcg/g

## 2011-02-13 ENCOUNTER — Telehealth: Payer: Self-pay | Admitting: Internal Medicine

## 2011-02-13 NOTE — Telephone Encounter (Signed)
Pt states he has called the billing office and was told to call [facility] because the cost had to do with the way the flu shot was coded.  Pt states he is getting upset and would like an answer.  Pt usually gets a discounted rate with [facility] and would like to see the cost of his flu shot go down to $31.99. Pls advise.

## 2011-02-13 NOTE — Telephone Encounter (Signed)
Patient states that he received bill in mail regarding his flu shot for 65.50. Patient is very upset over this, stating that he could have gone to [facility] to get his flu shot for 31.99. He wants to know if we can discount his flu shot from 65.50 to 31.99??

## 2011-02-16 ENCOUNTER — Ambulatory Visit (INDEPENDENT_AMBULATORY_CARE_PROVIDER_SITE_OTHER): Payer: PRIVATE HEALTH INSURANCE | Admitting: Internal Medicine

## 2011-02-16 ENCOUNTER — Encounter: Payer: Self-pay | Admitting: Internal Medicine

## 2011-02-16 VITALS — BP 120/70 | HR 76 | Ht 73.0 in | Wt 260.0 lb

## 2011-02-16 DIAGNOSIS — K648 Other hemorrhoids: Secondary | ICD-10-CM

## 2011-02-16 DIAGNOSIS — R159 Full incontinence of feces: Secondary | ICD-10-CM

## 2011-02-16 MED ORDER — HYDROCORTISONE ACETATE 25 MG RE SUPP: 25.0000 mg | RECTAL | Status: DC | PRN

## 2011-02-16 NOTE — Patient Instructions (Signed)
You have been given a prescription for Anusol HC suppositories. We will contact you next week with your referral appointment date and time to [facility].

## 2011-02-16 NOTE — Progress Notes (Signed)
  Subjective:    Patient ID: Russell Thomas, male    DOB: 01-29-1961, 51 y.o.   MRN: [id number]  HPI The patient returns for followup of fecal incontinence and loose stools. It was thought that he had some hemorrhoid problems contributing to this. He was prescribed hydrocortisone suppositories and he notes that his stool leakage is not a problem when he uses those. He wonders if chocolate and sugars may contribute to his problem based upon previous experience but is not really sure. He did not start Metamucil because there was either sugar or sugar free additives in it and he was concerned about those contents and does not want to use them. In general he is improved, and when he is on a hydrocortisone suppositories he seems to be doing well. He has not had any bleeding. I believe he has had some formed stools as best I can tell from his history.   Review of Systems As above.    Objective:   Physical Exam Well-developed nourished in no acute distress  His fecal elastase was normal and fecal lactoferrin was negative.     Assessment & Plan:   1. Fecal incontinence   2. Internal hemorrhoids    I guess his problems are related to his hemorrhoids based upon the response to the suppositories. I have explained that this is plausible but not proven. He did not have proctitis on his previous colonoscopy by Dr. Arlyce Dice and since he had a negative fecal lactoferrin I think that seems very unlikely. He will continue the hydrocortisone suppositories as needed, he understands that not a good chronic therapy. He will look for some psyllium to try to bulk up his stools, I believe he can find that without sugar or sweeteners and it. We also decided to have him see a surgeon to reassess his hemorrhoids and to get their thoughts on whether or not this could be a cause of his problems. He may be a candidate for hemorrhoidal treatment perhaps using a different method than band ligation given lack of response before.

## 2011-02-20 NOTE — Telephone Encounter (Signed)
Several telephone discussions.  Patient was very angry with cycle of calls and no result.  Advised the charges for flu shot are customary.  Will reduce cost of shot in this instance.  In the future, he should consistently ask [place] to verify his coverage and costs/out of pocket before he decides to receive services, and he agreed.  Patient will not change physicians and problem resolved.

## 2011-02-22 ENCOUNTER — Other Ambulatory Visit: Payer: Self-pay | Admitting: Gastroenterology

## 2011-02-22 NOTE — Progress Notes (Signed)
Patient had late appointment office visit 02/16/11 and appointment couldn't be made at the time. Patient was told that we would contact him with appointment date and time to [facility]. Called [facility] to schedule an appointment. Appointment scheduled for 04/16/11 at 9:00. Patient informed.

## 2011-02-23 ENCOUNTER — Telehealth: Payer: Self-pay | Admitting: Gastroenterology

## 2011-02-23 NOTE — Telephone Encounter (Signed)
Message copied by Bernita Buffy on Fri Feb 23, 2011 10:49 AM ------      Message from: Marnette Burgess      Created: Thu Feb 22, 2011  3:03 PM       Patient is scheduled for the 1st Monday available, 04/16/11 @ 9:00am, arrive @ 8:30am.  If you have any questions please call [phone].  If the patient would like to r/s please have him call the  [phone].            Thank You,      Elane Fritz                  ----- Message -----         From: Orpah Melter. Earlene Plater, [place]         Sent: 02/22/2011   2:24 PM           To: Marnette Burgess            Need to place a new patient appointment for this patient with Dr. Derrell Lolling for hemorrhoids. Patient prefer Mon or Fri early morning or late after noon appointment. Please let me know date and time. Thank you.

## 2011-02-23 NOTE — Telephone Encounter (Signed)
Patient informed. 

## 2011-03-15 ENCOUNTER — Ambulatory Visit (INDEPENDENT_AMBULATORY_CARE_PROVIDER_SITE_OTHER): Payer: PRIVATE HEALTH INSURANCE | Admitting: General Surgery

## 2011-04-16 ENCOUNTER — Ambulatory Visit (INDEPENDENT_AMBULATORY_CARE_PROVIDER_SITE_OTHER): Payer: PRIVATE HEALTH INSURANCE | Admitting: General Surgery

## 2011-06-06 ENCOUNTER — Telehealth: Payer: Self-pay | Admitting: Internal Medicine

## 2011-06-06 MED ORDER — LORAZEPAM 0.5 MG PO TABS: 0.5000 mg | ORAL_TABLET | Freq: Three times a day (TID) | ORAL | Status: DC

## 2011-06-06 NOTE — Telephone Encounter (Signed)
RX sent

## 2011-06-06 NOTE — Telephone Encounter (Signed)
Refill: Lorazepam 0.5mg tablet. Take 1 tablet by mouth every 8 hours as needed. 

## 2011-06-22 ENCOUNTER — Encounter: Payer: Self-pay | Admitting: Family Medicine

## 2011-06-22 ENCOUNTER — Ambulatory Visit (INDEPENDENT_AMBULATORY_CARE_PROVIDER_SITE_OTHER): Payer: PRIVATE HEALTH INSURANCE | Admitting: Family Medicine

## 2011-06-22 ENCOUNTER — Telehealth: Payer: Self-pay | Admitting: Internal Medicine

## 2011-06-22 VITALS — BP 125/82 | HR 87 | Temp 98.4°F | Ht 73.0 in | Wt 261.6 lb

## 2011-06-22 DIAGNOSIS — M62838 Other muscle spasm: Secondary | ICD-10-CM

## 2011-06-22 MED ORDER — CYCLOBENZAPRINE HCL 10 MG PO TABS: ORAL_TABLET | ORAL | Status: DC

## 2011-06-22 MED ORDER — NAPROXEN 500 MG PO TABS: 500.0000 mg | ORAL_TABLET | Freq: Two times a day (BID) | ORAL | Status: DC

## 2011-06-22 NOTE — Patient Instructions (Signed)
This is a muscle spasm Start the Naproxen twice daily- w/ food- for 7-10 days and then as needed Use the Flexeril nightly as needed Apply heat for pain relief Call with any questions or concerns Hang in there!!!

## 2011-06-22 NOTE — Telephone Encounter (Signed)
Caller: Russell Thomas/Patient; PCP: Marga Melnick; CB#: [phone]; ; ; Calling emergent line regarding Chest Pain/Chest Discomfort; THE PATIENT REFUSED 911; states has burning sensation in L  shoulder/collarbone  area.  Onset 06/18/11.  States he did play soccer 06/19/11 but no symptoms at that time.  Per protocol, emergent symptoms denied; advised appt within 24 hours.  Appt sched 1445 06/22/11 with Dr. Beverely Low.

## 2011-06-22 NOTE — Progress Notes (Signed)
  Subjective:    Patient ID: Russell Thomas, male    DOB: 01/18/1961, 51 y.o.   MRN: [id number]  HPI Burning sensation of L shoulder.  Worsens w/ stress.  Also having HA- L occipital.  sxs started several weeks ago.  Pain was slightly worse today- having stressful day.  Will not prevent him from sleeping or wake him.  sxs don't change w/ shoulder movement.  No SOB, diaphoresis, chest pressure.  Review of Systems For ROS see HPI     Objective:   Physical Exam  Vitals reviewed. Constitutional: He appears well-developed and well-nourished. No distress.  Neck: Neck supple. No thyromegaly present.       Tight L trap spasm  Cardiovascular: Normal rate, regular rhythm, normal heart sounds and intact distal pulses.   No murmur heard. Pulmonary/Chest: Effort normal and breath sounds normal. No respiratory distress. He has no wheezes. He has no rales.  Musculoskeletal:       No bony or joint tenderness of L shoulder, full ROM, strength and sensation intact  Lymphadenopathy:    He has no cervical adenopathy.          Assessment & Plan:

## 2011-06-23 NOTE — Assessment & Plan Note (Signed)
New.  Pt's trap spasm is causing tension HA and burning pain of L shoulder and upper chest as it compresses exiting nerves.  Start scheduled NSAIDs, muscle relaxer prn.  Reviewed supportive care and red flags that should prompt return.  Pt expressed understanding and is in agreement w/ plan.

## 2011-06-27 ENCOUNTER — Ambulatory Visit (INDEPENDENT_AMBULATORY_CARE_PROVIDER_SITE_OTHER): Payer: PRIVATE HEALTH INSURANCE | Admitting: Internal Medicine

## 2011-06-27 ENCOUNTER — Encounter: Payer: Self-pay | Admitting: Internal Medicine

## 2011-06-27 VITALS — BP 122/80 | HR 72 | Temp 98.7°F | Wt 264.4 lb

## 2011-06-27 DIAGNOSIS — L259 Unspecified contact dermatitis, unspecified cause: Secondary | ICD-10-CM

## 2011-06-27 MED ORDER — HYDROXYZINE HCL 10 MG PO TABS: 10.0000 mg | ORAL_TABLET | Freq: Three times a day (TID) | ORAL | Status: AC | PRN

## 2011-06-27 MED ORDER — MOMETASONE FUROATE 0.1 % EX OINT: TOPICAL_OINTMENT | Freq: Two times a day (BID) | CUTANEOUS | Status: DC

## 2011-06-27 NOTE — Progress Notes (Signed)
  Subjective:    Patient ID: Russell Thomas, male    DOB: [birth year], 51 y.o.   MRN: [id number]  HPI On   5/18 he was using Clorox as a disinfectant around his home on the front porch. He noted red spots which were intermittently intensely pruritic over the right wrist and hand. It has progressed over the last 3 days. He has not treated this with any medications. Specifically he has not employed lidocaine or triple antibiotic ointment.   Review of Systems he has had no fever, chills, sweats, sneezing, itchy/watery eyes, angioedema or wheezing.     Objective:   Physical Exam  He appears healthy and well-nourished  He has  no conjunctivitis. Nares are patent with no exudate or inflammation  Oropharynx is unremarkable. No angioedema findings are present  He has no lymphadenopathy about the neck , axilla, or epitrochlear areas.  Chest is clear with no increased work of breathing, wheezing or rhonchi  An S4 is present with no significant murmurs or gallops  No organomegaly is present  There is a small cherry angioma appearing  over the right index finger and dorsum of the right hand.There is mild erythematous change in 2 locations over the second right digit. There is an erythematous lesion with superficial ulceration at the right medial wrist area. The erythema blanches with pressure. He does have mild dermatographia        Assessment & Plan:  ##   #1 contact dermatitis with intermittent pruritus  Plan: see orders and recommendations

## 2011-06-27 NOTE — Patient Instructions (Signed)
Hydroxyzine 10 mg one  every 8 hours as needed for itching . Caution :May cause drowsiness or affect balance. Ranitidine 75 mg one to 2 pills every 12 hours can be added if itching persists despite the hydroxyzine

## 2011-08-20 ENCOUNTER — Ambulatory Visit (INDEPENDENT_AMBULATORY_CARE_PROVIDER_SITE_OTHER): Payer: PRIVATE HEALTH INSURANCE | Admitting: Family Medicine

## 2011-08-20 ENCOUNTER — Encounter: Payer: Self-pay | Admitting: Family Medicine

## 2011-08-20 VITALS — BP 130/75 | HR 80 | Temp 98.0°F | Ht 72.75 in | Wt 264.0 lb

## 2011-08-20 DIAGNOSIS — R202 Paresthesia of skin: Secondary | ICD-10-CM | POA: Insufficient documentation

## 2011-08-20 DIAGNOSIS — R209 Unspecified disturbances of skin sensation: Secondary | ICD-10-CM

## 2011-08-20 DIAGNOSIS — R200 Anesthesia of skin: Secondary | ICD-10-CM

## 2011-08-20 DIAGNOSIS — R7309 Other abnormal glucose: Secondary | ICD-10-CM

## 2011-08-20 DIAGNOSIS — R300 Dysuria: Secondary | ICD-10-CM

## 2011-08-20 LAB — CBC WITH DIFFERENTIAL/PLATELET
Eosinophils Relative: 0.4 % (ref 0.0–5.0)
Lymphocytes Relative: 26.5 % (ref 12.0–46.0)
MCV: 92.9 fl (ref 78.0–100.0)
Monocytes Absolute: 0.4 10*3/uL (ref 0.1–1.0)
Neutrophils Relative %: 67 % (ref 43.0–77.0)
Platelets: 196 10*3/uL (ref 150.0–400.0)
WBC: 7 10*3/uL (ref 4.5–10.5)

## 2011-08-20 LAB — POCT URINALYSIS DIPSTICK
Blood, UA: NEGATIVE
Protein, UA: NEGATIVE
Spec Grav, UA: 1.015
Urobilinogen, UA: 0.2

## 2011-08-20 LAB — HEMOGLOBIN A1C: Hgb A1c MFr Bld: 5.5 % (ref 4.6–6.5)

## 2011-08-20 LAB — BASIC METABOLIC PANEL
Chloride: 102 mEq/L (ref 96–112)
GFR: 94.86 mL/min (ref >60.00)
Glucose, Bld: 88 mg/dL (ref 70–99)
Potassium: 3.9 mEq/L (ref 3.5–5.1)
Sodium: 137 mEq/L (ref 135–145)

## 2011-08-20 LAB — TSH: TSH: 0.61 u[IU]/mL (ref 0.35–5.50)

## 2011-08-20 LAB — FOLATE: Folate: 16.6 ng/mL (ref >5.9)

## 2011-08-20 NOTE — Progress Notes (Signed)
  Subjective:    Patient ID: Russell Thomas, male    DOB: 03-Jul-1960, 51 y.o.   MRN: [id number]  HPI Dysuria- sxs started a few days ago.  + frequency but this predates dysuria.  No hematuria.  Mild hesitancy.  'slight' urgency.  No fevers.  No incontinence.  Tingling- bilateral hands and feet.  sxs started 1 week ago.  Denies weakness.  Wife wants diabetic testing due to weight gain.  Denies polydipsia.  + hunger.  + polyuria- but drinks multiple cups of coffee.  Tingling sensation is intermittent.  Nothing makes sxs worse.  Nothing relieves pain.  Review of Systems For ROS see HPI     Objective:   Physical Exam  Vitals reviewed. Constitutional: He is oriented to person, place, and time. He appears well-developed and well-nourished. No distress.  HENT:  Head: Normocephalic and atraumatic.  Eyes: Conjunctivae and EOM are normal. Pupils are equal, round, and reactive to light.  Neck: Normal range of motion. Neck supple. No thyromegaly present.       Bilateral trap spasm, L>R  Cardiovascular: Normal rate, regular rhythm, normal heart sounds and intact distal pulses.   No murmur heard. Pulmonary/Chest: Effort normal and breath sounds normal. No respiratory distress.  Abdominal: Soft. Bowel sounds are normal. He exhibits no distension. There is no tenderness (no suprapubic or CVA tenderness). There is no rebound.  Musculoskeletal: He exhibits no edema.  Lymphadenopathy:    He has no cervical adenopathy.  Neurological: He is alert and oriented to person, place, and time. He has normal reflexes. No cranial nerve deficit. Coordination normal.       (-) SLR bilaterally  Skin: Skin is warm and dry.  Psychiatric: He has a normal mood and affect. His behavior is normal.          Assessment & Plan:

## 2011-08-20 NOTE — Patient Instructions (Addendum)
Please schedule a follow up w/ Dr Alwyn Ren Your urine is completely normal (no sugar) so we will hold on antibiotics and send it for culture [facility] notify you of your lab results Limit your caffeine intake- this is a diuretic and will increase your urine frequency Try and limit your carb intake and get regular exercise Call with any questions or concerns Hang in there!!

## 2011-08-21 ENCOUNTER — Encounter: Payer: Self-pay | Admitting: *Deleted

## 2011-08-21 NOTE — Assessment & Plan Note (Signed)
Pt w/ hx of elevated glucose and reports he has recently gained weight.  Check labs to r/o DM as this is what he and his wife fear.  Encouraged him to schedule regular f/u w/ PCP.  Pt expressed understanding and is in agreement w/ plan.

## 2011-08-21 NOTE — Assessment & Plan Note (Signed)
New.  Suspect that upper extremity sxs are again related to tight trap spasms and pt's sxs as a whole may be related to his anxiety which does not seem well controlled.  Will check labs to r/o metabolic causes and then encouraged him to f/u w/ PCP.

## 2011-08-21 NOTE — Assessment & Plan Note (Signed)
New.  No evidence of infxn on UA.  Will send for cx and await results prior to starting abx.  Pt expressed understanding and is in agreement w/ plan.

## 2011-08-22 LAB — URINE CULTURE: Colony Count: NO GROWTH

## 2011-10-31 ENCOUNTER — Encounter: Payer: Self-pay | Admitting: Internal Medicine

## 2011-10-31 ENCOUNTER — Ambulatory Visit (INDEPENDENT_AMBULATORY_CARE_PROVIDER_SITE_OTHER): Payer: PRIVATE HEALTH INSURANCE | Admitting: Internal Medicine

## 2011-10-31 VITALS — BP 118/80 | HR 86 | Temp 98.7°F | Wt 268.4 lb

## 2011-10-31 DIAGNOSIS — J209 Acute bronchitis, unspecified: Secondary | ICD-10-CM

## 2011-10-31 MED ORDER — AZITHROMYCIN 250 MG PO TABS: ORAL_TABLET | ORAL | Status: DC

## 2011-10-31 NOTE — Progress Notes (Signed)
  Subjective:    Patient ID: Russell Thomas, male    DOB: 03-28-1960, 51 y.o.   MRN: [id number]  HPI Symptoms began 2-3 weeks ago as burning, itchy eyes with postnasal drainage and head congestion. He attributed this to ragweed exposure. Claritin was of minimal benefit.  The congestion has persisted without significant nasal purulence. He has had some facial pain. He describes cough with yellow sputum. He has had some shortness of breath and slight wheezing. Additionally he has had  some laryngitis.  He has been using a Neti pot which have been prophylactic in the past. He started Nasonex 1 week ago.    Review of Systems He denies frontal headache, dental pain, or sore throat.  He has no history of asthma. He rarely will smoke a cigar       Objective:   Physical Exam General appearance:good health ;well nourished; no acute distress or increased work of breathing is present.  No  lymphadenopathy about the head, neck, or axilla noted.   Eyes: No conjunctival inflammation or lid edema is present.   Ears:  External ear exam shows no significant lesions or deformities.  R canal & tympanic membrane  intact without bulging, retraction, inflammation or discharge. Wax on L  Nose:  External nasal examination shows no deformity or inflammation. Nasal mucosa are dry without lesions or exudates. Slight R septal  deviation.No obstruction to airflow.   Oral exam: Dental hygiene is good; lips and gums are healthy appearing.There is no oropharyngeal erythema ; no exudate noted.   Neck:  No deformities, masses, or tenderness noted.     Heart:  Normal rate and regular rhythm. S1 and S2 normal without gallop, murmur, click, rub or other extra sounds.   Lungs:Chest clear to auscultation; no wheezes, rhonchi,rales ,or rubs present.No increased work of breathing.    Extremities:  No cyanosis, edema, or clubbing  noted    Skin: Warm & dry.          Assessment & Plan:  #1 acute bronchitis w/o  bronchospasm #2 extrinsic rhinitis Plan: See orders and recommendations

## 2011-10-31 NOTE — Patient Instructions (Addendum)
Plain Mucinex for thick secretions ;force NON dairy fluids . Use a Neti pot daily as needed for sinus congestion; going from open side to congested side . Nasal cleansing in the shower as discussed. Make sure that all residual soap is removed to prevent irritation. Nasonex 1 spray in each nostril twice a day as needed. Use the "crossover" technique as discussed. Plain Allegra 160 daily as needed for itchy eyes & sneezing.    

## 2011-11-12 ENCOUNTER — Encounter: Payer: Self-pay | Admitting: Internal Medicine

## 2011-11-12 ENCOUNTER — Ambulatory Visit (INDEPENDENT_AMBULATORY_CARE_PROVIDER_SITE_OTHER): Payer: PRIVATE HEALTH INSURANCE | Admitting: Internal Medicine

## 2011-11-12 VITALS — BP 128/80 | HR 76 | Temp 98.1°F | Resp 14 | Ht 73.0 in | Wt 269.2 lb

## 2011-11-12 DIAGNOSIS — J209 Acute bronchitis, unspecified: Secondary | ICD-10-CM

## 2011-11-12 DIAGNOSIS — Z0000 Encounter for general adult medical examination without abnormal findings: Secondary | ICD-10-CM

## 2011-11-12 LAB — HEPATIC FUNCTION PANEL
Albumin: 4.2 g/dL (ref 3.5–5.2)
Alkaline Phosphatase: 45 U/L (ref 39–117)
Total Protein: 7.1 g/dL (ref 6.0–8.3)

## 2011-11-12 LAB — LIPID PANEL
Cholesterol: 178 mg/dL (ref 0–200)
HDL: 53.2 mg/dL (ref >39.00)
LDL Cholesterol: 111 mg/dL — ABNORMAL HIGH (ref 0–99)
Total CHOL/HDL Ratio: 3
Triglycerides: 71 mg/dL (ref 0.0–149.0)
VLDL: 14.2 mg/dL (ref 0.0–40.0)

## 2011-11-12 MED ORDER — BECLOMETHASONE DIPROPIONATE 80 MCG/ACT IN AERS: 2.0000 | INHALATION_SPRAY | Freq: Two times a day (BID) | RESPIRATORY_TRACT | Status: DC

## 2011-11-12 MED ORDER — DOXYCYCLINE HYCLATE 100 MG PO TABS: ORAL_TABLET | ORAL | Status: DC

## 2011-11-12 MED ORDER — HYDROCODONE-HOMATROPINE 5-1.5 MG/5ML PO SYRP: 5.0000 mL | ORAL_SOLUTION | Freq: Four times a day (QID) | ORAL | Status: DC | PRN

## 2011-11-12 NOTE — Patient Instructions (Addendum)
Preventive Health Care: Exercise at least 30-45 minutes a day,  3-4 days a week.  The most common cause of elevated triglycerides is the ingestion of sugar from high fructose corn syrup sources added to processed foods & drinks.  Eat a low-fat diet with lots of fruits and vegetables, up to 7-9 servings per day. Consume less than 40 (preferably ZERO) grams of sugar per day from foods & drinks with High Fructose Corn Syrup (HFCS) sugar as #1,2,3 or # 4 on label.Whole [employer], [employer] & [employer] do not carry products with HFCS. Follow a  low carb nutrition program such as [place] or The [employer] Sugar [employer]  to prevent Diabetes progression . White carbohydrates (potatoes, rice, bread, and pasta) have a high spike of sugar and a high load of sugar. For example a  baked potato has a cup of sugar and a  french fry  2 teaspoons of sugar. Yams, wild  rice, whole grained bread &  wheat pasta have been much lower spike and load of  sugar. Portions should be the size of a deck of cards or your palm. QVAR one-2  inhalations every 12 hours; gargle and spit after use

## 2011-11-12 NOTE — Progress Notes (Signed)
  Subjective:    Patient ID: Russell Thomas, male    DOB: [birth year], 51 y.o.   MRN: [id number]  HPI Mr Pepitone is here for a physical;acute issues include residual bronchitic symptoms      Review of Systems He has completed a Z-Pak; but he continues to have intermittent yellow sputum and ongoing chest congestion. He denies fever, chills, or sweats. He's had some low-grade wheezing w/o dyspnea.  Triglycerides have been over 500 mg. He does not exercise regularly. He admits that his diet is high fat and contains increased hyperglycemic carbs .      Objective:   Physical Exam Gen.:  well-nourished in appearance. Alert, appropriate and cooperative throughout exam. Head: Normocephalic without obvious abnormalities;  no alopecia  Eyes: No corneal or conjunctival inflammation noted. Pupils equal round reactive to light and accommodation. Fundal exam is benign without hemorrhages, exudate, papilledema. Extraocular motion intact. Vision grossly normal. Ears: External  ear exam reveals no significant lesions or deformities. Canals: wax on L. R TM normal. Hearing is grossly normal bilaterally. Nose: External nasal exam reveals no deformity or inflammation. Nasal mucosa are pink and moist. No lesions or exudates noted.  Mouth: Oral mucosa and oropharynx reveal no lesions or exudates. Teeth in good repair. Neck: No deformities, masses, or tenderness noted. Range of motion & Thyroid normal. Lungs: Normal respiratory effort; chest expands symmetrically. Lungs are clear to auscultation without rales, wheezes, or increased work of breathing. Heart: Normal rate and rhythm. Normal S1 and S2. No gallop, click, or rub. S4 w/o murmur. Abdomen: Bowel sounds normal; abdomen soft and nontender. No masses, organomegaly or hernias noted. Genitalia/DRE: Genitalia normal . Prostate is normal without enlargement, asymmetry, nodularity, or induration.  Musculoskeletal/extremities: No deformity or scoliosis noted of  the  thoracic or lumbar spine. No clubbing, cyanosis, edema, or deformity noted. Range of motion  normal .Tone & strength  normal.Joints normal. Nail health  good. Vascular: Carotid, radial artery, dorsalis pedis and  posterior tibial pulses are full and equal. No bruits present. Neurologic: Alert and oriented x3. Deep tendon reflexes symmetrical and normal.          Skin: Intact without suspicious lesions or rashes. Lymph: No cervical, axillary, or inguinal lymphadenopathy present. Psych: Mood and affect are normal. Normally interactive                                                                                       Assessment & Plan:  #1 comprehensive physical exam; no acute findings #2 residual bronchitic issues; multiple antibiotic intolerances/allergies Plan: see Orders

## 2011-12-06 ENCOUNTER — Encounter: Payer: Self-pay | Admitting: Internal Medicine

## 2011-12-06 ENCOUNTER — Ambulatory Visit (INDEPENDENT_AMBULATORY_CARE_PROVIDER_SITE_OTHER): Payer: PRIVATE HEALTH INSURANCE | Admitting: Internal Medicine

## 2011-12-06 VITALS — BP 114/68 | HR 84 | Temp 98.0°F | Ht 73.0 in | Wt 270.0 lb

## 2011-12-06 DIAGNOSIS — R05 Cough: Secondary | ICD-10-CM

## 2011-12-06 DIAGNOSIS — G4730 Sleep apnea, unspecified: Secondary | ICD-10-CM

## 2011-12-06 MED ORDER — PREDNISONE (PAK) 10 MG PO TABS: ORAL_TABLET | ORAL | Status: DC

## 2011-12-06 NOTE — Patient Instructions (Addendum)
GERD (REFLUX)  is an extremely common cause of respiratory symptoms, many times with no significant heartburn at all.    It can be treated with medication, but also with lifestyle changes including avoidance of late meals, excessive alcohol, smoking cessation, and avoid fatty foods, chocolate, peppermint, colas, red wine, and acidic juices such as orange juice.  NO MINT OR MENTHOL PRODUCTS SO NO COUGH DROPS  USE SUGARLESS CANDY INSTEAD (jolley ranchers or Stover's)  NO OIL BASED VITAMINS - use powdered substitutes.    Try prilosec 20mg   Take 30-60 min before first meal of the day and Pepcid 20 mg one bedtime until cough is completely gone for at least a week without the need for cough suppression  Delsym 2 tsp every 12 hours as needed for cough  I think of reflux for chronic cough like I do oxygen for fire (doesn't cause the fire but once you get the oxygen suppressed it usually goes away regardless of the exact cause).   For itching, sneezing runny nose chlortrimeton 4 mg one every 6 hours   Prednisone 10 mg take  4 each am x 2 days,   2 each am x 2 days,  1 each am x2days and stop   We will make you an appt to follow up with Dr Maple Hudson for sleep evaluation

## 2011-12-06 NOTE — Assessment & Plan Note (Signed)
Requesting rx per our sleep staff as no longer being followed for osa at neurology

## 2011-12-06 NOTE — Progress Notes (Signed)
Subjective:    Patient ID: Russell Thomas, male    DOB: 27-Aug-1960  MRN: [id number]  HPI  58 yowm rare cigar smoker with "nasal allergies all his life" esp with trees in spring, grass in summer and ragweed in the fall eval by allergy eval by Kozlow on shots x 2 years ? Helped some around 2000 self referred 12/06/2011 for new onset cough.   12/06/2011 1st pulmonary eval cc acute min productive cough onset with "worst ever" nasal allergies in August of 2013 rx with zpak and doxy and not prednisone and some better nasal symptoms with minimal improvement in cough. Also noted mild sob with exertion    Kouffman Reflux v Neurogenic Cough Differentiator Reflux Comments  Do you awaken from a sound sleep coughing violently?                            With trouble breathing? no   Do you have choking episodes when you cannot  Get enough air, gasping for air ?              no   Do you usually cough when you lie down into  The bed, or when you just lie down to rest ?                          no   Do you usually cough after meals or eating?         no   Do you cough when (or after) you bend over?    no   GERD SCORE     Kouffman Reflux v Neurogenic Cough Differentiator Neurogenic   Do you more-or-less cough all day long? yes   Does change of temperature make you cough? No    Does laughing or chuckling cause you to cough? no   Do fumes (perfume, automobile fumes, burned  Toast, etc.,) cause you to cough ?      no   Does speaking, singing, or talking on the phone cause you to cough   ?               No    Neurogenic/Airway score         Review of Systems  Constitutional: Negative for fever, chills, activity change, appetite change and unexpected weight change.  HENT: Positive for congestion and sneezing. Negative for sore throat, rhinorrhea, trouble swallowing, dental problem, voice change and postnasal drip.   Eyes: Negative for visual disturbance.  Respiratory: Positive for cough and  shortness of breath. Negative for choking.   Cardiovascular: Positive for chest pain. Negative for leg swelling.  Gastrointestinal: Negative for nausea, vomiting and abdominal pain.  Genitourinary: Negative for difficulty urinating.  Musculoskeletal: Negative for arthralgias.  Skin: Negative for rash.  Psychiatric/Behavioral: Negative for behavioral problems and confusion.       Objective:   Physical Exam  Wt Readings from Last 3 Encounters:  12/06/11 270 lb (122.471 kg)  11/12/11 269 lb 3.2 oz (122.108 kg)  10/31/11 268 lb 6.4 oz (121.745 kg)    HEENT: nl dentition, turbinates, and orophanx. Nl external ear canals without cough reflex   NECK :  without JVD/Nodes/TM/ nl carotid upstrokes bilaterally   LUNGS: no acc muscle use, clear to A and P bilaterally without cough on insp or exp maneuvers   CV:  RRR  no s3 or murmur or increase in P2, no edema  ABD:  soft and nontender with nl excursion in the supine position. No bruits or organomegaly, bowel sounds nl  MS:  warm without deformities, calf tenderness, cyanosis or clubbing  SKIN: warm and dry without lesions    NEURO:  alert, approp, no deficits        Assessment & Plan:

## 2011-12-06 NOTE — Assessment & Plan Note (Addendum)
The most common causes of chronic cough in immunocompetent adults include the following: upper airway cough syndrome (UACS), previously referred to as postnasal drip syndrome (PNDS), which is caused by variety of rhinosinus conditions; (2) asthma; (3) GERD; (4) chronic bronchitis from cigarette smoking or other inhaled environmental irritants; (5) nonasthmatic eosinophilic bronchitis; and (6) bronchiectasis.   These conditions, singly or in combination, have accounted for up to 94% of the causes of chronic cough in prospective studies.   Other conditions have constituted no >6% of the causes in prospective studies These have included bronchogenic carcinoma, chronic interstitial pneumonia, sarcoidosis, left ventricular failure, ACEI-induced cough, and aspiration from a condition associated with pharyngeal dysfunction.   Most likely this is  Classic Upper airway cough syndrome, so named because it's frequently impossible to sort out how much is  CR/sinusitis with freq throat clearing (which can be related to primary GERD)   vs  causing  secondary (" extra esophageal")  GERD from wide swings in gastric pressure that occur with throat clearing, often  promoting self use of mint and menthol lozenges that reduce the lower esophageal sphincter tone and exacerbate the problem further in a cyclical fashion.   These are the same pts (now being labeled as having "irritable larynx syndrome" by some cough centers) who not infrequently have a history of having failed to tolerate ace inhibitors,  dry powder inhalers or biphosphonates or report having atypical reflux symptoms that don't respond to standard doses of PPI , and are easily confused as having aecopd or asthma flares by even experienced allergists/ pulmonologists.   For now try max rx for gerd and 1st gen H1 per guidelines then allergy eval next if symptoms continue.

## 2011-12-18 ENCOUNTER — Other Ambulatory Visit: Payer: Self-pay | Admitting: Internal Medicine

## 2011-12-18 NOTE — Telephone Encounter (Signed)
#   30 ; 1 every 8-12 prn . R X2

## 2011-12-18 NOTE — Telephone Encounter (Signed)
OV 10/31/11 Last filled 06/06/11 #30 x 2      Plz  Advise     MW

## 2011-12-18 NOTE — Telephone Encounter (Signed)
Rx printed and faxed.   MW  

## 2012-01-28 ENCOUNTER — Other Ambulatory Visit: Payer: PRIVATE HEALTH INSURANCE

## 2012-01-28 ENCOUNTER — Encounter: Payer: Self-pay | Admitting: Internal Medicine

## 2012-01-28 ENCOUNTER — Ambulatory Visit (INDEPENDENT_AMBULATORY_CARE_PROVIDER_SITE_OTHER): Payer: PRIVATE HEALTH INSURANCE | Admitting: Internal Medicine

## 2012-01-28 VITALS — BP 118/84 | HR 80 | Ht 73.0 in | Wt 273.8 lb

## 2012-01-28 DIAGNOSIS — J309 Allergic rhinitis, unspecified: Secondary | ICD-10-CM

## 2012-01-28 DIAGNOSIS — G4730 Sleep apnea, unspecified: Secondary | ICD-10-CM

## 2012-01-28 NOTE — Progress Notes (Signed)
01/28/12- 89 yoM current smoker referred courtesy of Dr Sherene Sires for 2 problems-obstructive sleep apnea and allergy. Has CPAP and having bad allergies as well(has been tested from Dr Lucie Leather; was on injections for about 1 year but no longer sees Dr. Lucie Leather). He follows with Dr. Sherene Sires for cough and has been treated for reflux, but is still coughing some. He was originally diagnosed with sleep apnea while being [facility] by a physician who has now left. We do not yet have the original sleep study. Current CPAP is 11 CWP and he has been able to use it every night. Bedtime between 9 and 10:30 PM with short sleep latency. He wakes several x2 the night before up at 6:30 AM. History of nasal septoplasty and rhinoplasty after fracture. History of seasonal rhinitis: Previous skin tested positive for tree and grass pollens, ragweed. Rhinitis symptoms have bothered him since he was a child. Allergy vaccine did seem to help. This year has been unusually bad. He considered restarting allergy vaccine when Zyrtec quit helping. He now uses Nasonex and an ST pot.  Prior to Admission medications   Medication Sig Start Date End Date Taking? Authorizing Provider  LORazepam (ATIVAN) 0.5 MG tablet TAKE 1 TABLET BY MOUTH EVERY 8 HOURS 12/18/11  Yes Pecola Lawless, MD   Past Medical History  Diagnosis Date  . Sleep apnea     CPAP,[facility]  . Hyperlipemia 2009  . Adenomatous colon polyp 2008    Dr Arlyce Dice, GI  . Diverticulosis   . IBS (irritable bowel syndrome)   . Allergic rhinitis   . Internal hemorrhoids 2012    banded   . Gastritis    Past Surgical History  Procedure Date  . Septoplasty   . Thumb surgery   . Tonsillectomy   . Colonoscopy 04/10/2010, 08/12/2006    3/12 with banding; 2008: adenomatous polyp, diverticulosis  . Upper gastrointestinal endoscopy 10/24/2001    gastritis   Family History  Problem Relation Age of Onset  . Thyroid disease Mother     hypothyroidism   . Colitis Father   . Alcohol abuse Paternal Grandfather   . Diabetes Neg Hx   . Heart disease Neg Hx   . Stroke Neg Hx   . Allergies Mother    History   Social History  . Marital Status: Married    Spouse Name: N/A    Number of Children: 1  . Years of Education: N/A   Occupational History  . Sales    Social History Main Topics  . Smoking status: Current Some Day Smoker    Types: Cigars  . Smokeless tobacco: Never Used     Comment: 1 cigar / year  . Alcohol Use: Yes     Comment: rare ; 0-1 drink / month  . Drug Use: No  . Sexually Active: Not on file   Other Topics Concern  . Not on file   Social History Narrative   NO REG EXERCISE   ROS-see HPI Constitutional:   No-   weight loss, night sweats, fevers, chills, +fatigue, no- lassitude. HEENT:   No-  headaches, difficulty swallowing, tooth/dental problems, sore throat,       + sneezing, itching, ear ache, nasal congestion, post nasal drip,  CV:  No-   chest pain, orthopnea, PND, swelling in lower extremities, anasarca, dizziness, palpitations Resp: No-   shortness of breath with exertion or at rest.  No-   productive cough,  No non-productive cough,  No- coughing up of blood.              No-   change in color of mucus.  No- wheezing.   Skin: No-   rash or lesions. GI:  No-   heartburn, indigestion, abdominal pain, nausea, vomiting, diarrhea,                 change in bowel habits, loss of appetite GU: No-   dysuria, change in color of urine, no urgency or frequency.  No- flank pain. MS:  No-   joint pain or swelling.  No- decreased range of motion.  No- back pain. Neuro-     nothing unusual Psych:  No- change in mood or affect. No depression or anxiety.  No memory loss.  OBJ- Physical Exam General- Alert, Oriented, Affect-appropriate, Distress- none acute. Stocky body build Skin- rash-none, lesions- none, excoriation- none Lymphadenopathy- none Head- atraumatic            Eyes- Gross vision intact,  PERRLA, conjunctivae and secretions clear            Ears- Hearing, canals-normal            Nose- Clear, no-Septal dev, mucus, polyps, erosion, perforation. + Small bruise on the side of his nose from his CPAP mask.             Throat- Mallampati II-III , mucosa clear , drainage- none, tonsils- atrophic Neck- flexible , trachea midline, no stridor , thyroid nl, carotid no bruit Chest - symmetrical excursion , unlabored           Heart/CV- RRR , no murmur , no gallop  , no rub, nl s1 s2                           - JVD- none , edema- none, stasis changes- none, varices- none           Lung- clear to P&A, wheeze- none, cough- none , dullness-none, rub- none           Chest wall-  Abd- tender-no, distended-no, bowel sounds-present, HSM- no Br/ Gen/ Rectal- Not done, not indicated Extrem- cyanosis- none, clubbing, none, atrophy- none, strength- nl Neuro- grossly intact to observation

## 2012-01-28 NOTE — Patient Instructions (Addendum)
Order- [facility] refer to Sleep Center daytime staff for CPAP desensitization/ mask fit     Dx OSA  CPAP 11, [facility]   We will check with the neurology sleep center to see if they have a copy of your original diagnostic sleep study  Order- lab Allergy profile  Schedule allergy skin testing  - need to be off all antihistamines for 3 days before skin testing

## 2012-01-29 LAB — ALLERGY FULL PROFILE
Allergen, D pternoyssinus,d7: <0.1 kU/L
Allergen,Goose feathers, e70: <0.1 kU/L
Bermuda Grass: 0.2 kU/L — ABNORMAL HIGH
Box Elder IgE: <0.1 kU/L
Candida Albicans: <0.1 kU/L
Curvularia lunata: <0.1 kU/L
Dog Dander: <0.1 kU/L
Fescue: 0.7 kU/L — ABNORMAL HIGH
G005 Rye, Perennial: 0.7 kU/L — ABNORMAL HIGH
Goldenrod: <0.1 kU/L
Helminthosporium halodes: <0.1 kU/L
House Dust Hollister: <0.1 kU/L
IgE (Immunoglobulin E), Serum: 32.2 IU/mL (ref 0.0–180.0)
Oak: 0.11 kU/L — ABNORMAL HIGH
Stemphylium Botryosum: 0.18 kU/L — ABNORMAL HIGH
Timothy Grass: 0.61 kU/L — ABNORMAL HIGH

## 2012-02-04 ENCOUNTER — Other Ambulatory Visit (HOSPITAL_BASED_OUTPATIENT_CLINIC_OR_DEPARTMENT_OTHER): Payer: PRIVATE HEALTH INSURANCE

## 2012-02-07 NOTE — Progress Notes (Signed)
Quick Note:  Pt aware of results. ______ 

## 2012-02-09 NOTE — Assessment & Plan Note (Signed)
Good compliance and control with CPAP. He needs refitting of his mask. We can send him to the sleep center daytime staff for help with this. We will attempt to get his original diagnostic sleep study for our records.

## 2012-02-09 NOTE — Assessment & Plan Note (Signed)
Strong symptomatic association with seasonal pollens. Plan-we discussed antihistamines and nasal steroids. Discussed environmental precautions. Plan-allergy profile

## 2012-02-11 ENCOUNTER — Ambulatory Visit (HOSPITAL_BASED_OUTPATIENT_CLINIC_OR_DEPARTMENT_OTHER): Payer: PRIVATE HEALTH INSURANCE | Attending: Internal Medicine

## 2012-02-11 DIAGNOSIS — G4730 Sleep apnea, unspecified: Secondary | ICD-10-CM

## 2012-03-12 ENCOUNTER — Ambulatory Visit: Payer: PRIVATE HEALTH INSURANCE | Admitting: Internal Medicine

## 2012-04-02 ENCOUNTER — Ambulatory Visit: Payer: PRIVATE HEALTH INSURANCE | Admitting: Internal Medicine

## 2012-04-09 ENCOUNTER — Ambulatory Visit (INDEPENDENT_AMBULATORY_CARE_PROVIDER_SITE_OTHER): Payer: PRIVATE HEALTH INSURANCE | Admitting: Internal Medicine

## 2012-04-09 ENCOUNTER — Encounter: Payer: Self-pay | Admitting: Internal Medicine

## 2012-04-09 VITALS — BP 120/82 | HR 84 | Temp 98.7°F | Wt 268.0 lb

## 2012-04-09 DIAGNOSIS — F411 Generalized anxiety disorder: Secondary | ICD-10-CM

## 2012-04-09 DIAGNOSIS — H5702 Anisocoria: Secondary | ICD-10-CM

## 2012-04-09 DIAGNOSIS — T887XXA Unspecified adverse effect of drug or medicament, initial encounter: Secondary | ICD-10-CM

## 2012-04-09 DIAGNOSIS — G479 Sleep disorder, unspecified: Secondary | ICD-10-CM

## 2012-04-09 NOTE — Patient Instructions (Addendum)
Review and correct the record as indicated. Please share record with all medical staff seen.  

## 2012-04-09 NOTE — Progress Notes (Signed)
  Subjective:    Patient ID: Russell Thomas, male    DOB: 01/22/1961, 52 y.o.   MRN: [id number]  HPI  Onset of symptoms was 2 months ago as aniscoria noted by his wife ; OD larger than OS. This has occurred  ONLY after taking Lorazepam.Negative Ophthalmology exam 1 month ago. No PMH glaucoma. No associated lightheadedness , vertigo , or pre syncope  Symptoms were not related to reading or computer work,activity,straining ,pain, cardiac or neurologic prodrome. Duration of symptoms not documented;but it is not present next am.  .  No seizure activity or syncope described. No PMH of head trauma or LOC. No PMH of Bell's palsy       Review of Systems Constitutional : No fever , chills, sweats. 20 # gain in weight over 24 mos ENT:No sinus pressure;nasal purulence; tinnitus;earache ;otic discharge. ? L sided hearing loss Eye: No blurred vision , diplopia, vision loss Cardiac prodrome: No palpitations, irregular rhythm, heart rate change, nausea , sweating Pulmonary: No acute dyspnea, cough Chronic throat clearing. Neurologic :No significant headache , numbness and tingling,limb  weakness, change in coordination (gait/falling). Intermittent sharp pain behind OS lasting < seconds Psych: Chronic anxiety w/o panic or depression       Objective:   Physical Exam Gen. appearance: Well-nourished, in no distress Eyes: Extraocular motion intact, field of vision normal, vision grossly intact, no nystagmus. No anisocoria ENT: Canals clear, tympanic membranes normal, tuning fork exam normal, hearing grossly normal Neck: Normal range of motion, no masses, normal thyroid Cardiovascular: Rate and rhythm normal; no murmurs, gallops or extra heart sounds Muscle skeletal: Range of motion, tone, &  strength normal Neuro:Oriented X3.No cranial nerve deficit, deep tendon  reflexes normal, gait including tiptoe & heel walk normal. Rhomberg & finger to nose negative. Lymph: No cervical or axillary LA Skin:  Warm and dry without suspicious lesions or rashes Psych: no anxiety or mood change. Normally interactive and cooperative.          Assessment & Plan:  #1 intermittent anisocoria related to ingesting lorazepam. Visual documentation provided on his cell phone. Apparent negative ophthalmology evaluation one month ago. Pharmacopeia reveals no ophthalmologic adverse effects of lorazepam. #2 sleep apnea ; on CPAP #3 anxiety employed @ bedtime for sleep disruption related to anxiety  Plan: A copy this will be sent to his Ophthalmologist, Dr. Hazle Quant to see if any additional studies are indicated. Neurology assessment recommended

## 2012-04-11 ENCOUNTER — Ambulatory Visit: Payer: PRIVATE HEALTH INSURANCE | Admitting: Internal Medicine

## 2012-04-16 ENCOUNTER — Telehealth: Payer: Self-pay

## 2012-04-16 DIAGNOSIS — H5702 Anisocoria: Secondary | ICD-10-CM

## 2012-04-16 NOTE — Telephone Encounter (Signed)
Left message on VM informing patient per Dr.Hopper we will remove referral to Dr. Vickey Huger and place new referral to [facility]. I will contact Dr.Dohmeier's office in the morning to inform them that referral request can be voided.

## 2012-04-18 ENCOUNTER — Telehealth: Payer: Self-pay | Admitting: Internal Medicine

## 2012-04-18 NOTE — Telephone Encounter (Signed)
I have contacted Kissa @ [phone] and asked that referral request be voided and informed her of Dr.Hopper's plan to refer patient to Neurophthalmology   Left message on VM for patient informing him that he will be contacted regarding referral to Neurophthalmology

## 2012-04-18 NOTE — Telephone Encounter (Signed)
ERROR

## 2012-04-18 NOTE — Telephone Encounter (Signed)
returned your call can call back at [phone] Also note pt does have mychart

## 2012-04-21 ENCOUNTER — Telehealth: Payer: Self-pay | Admitting: Internal Medicine

## 2012-04-21 NOTE — Telephone Encounter (Signed)
Pt is very upset - he feels no one is helping him with his referral . Can one of you help me - he has placed a calls and feels that nothing is being done. I am not sure how to help him since Luster Landsberg is gone. But there are a few phone calls in epic - can anyone help??? Thanks Amym

## 2012-06-07 ENCOUNTER — Other Ambulatory Visit: Payer: Self-pay | Admitting: Internal Medicine

## 2012-06-09 NOTE — Telephone Encounter (Signed)
Patient aware Controlled Substance Contract to be sign and rx to be picked up   

## 2012-06-24 ENCOUNTER — Encounter: Payer: Self-pay | Admitting: Internal Medicine

## 2012-10-20 ENCOUNTER — Telehealth: Payer: Self-pay | Admitting: Gastroenterology

## 2012-10-20 NOTE — Telephone Encounter (Signed)
Pt states he is having problems with acid reflux. Requesting to be seen. Pt scheduled to see Doug Sou PA tomorrow at 9:30am. Pt aware of appt.

## 2012-10-21 ENCOUNTER — Ambulatory Visit (INDEPENDENT_AMBULATORY_CARE_PROVIDER_SITE_OTHER): Payer: PRIVATE HEALTH INSURANCE | Admitting: Gastroenterology

## 2012-10-21 ENCOUNTER — Encounter: Payer: Self-pay | Admitting: Gastroenterology

## 2012-10-21 VITALS — BP 134/80 | HR 71 | Ht 73.0 in | Wt 267.0 lb

## 2012-10-21 DIAGNOSIS — K219 Gastro-esophageal reflux disease without esophagitis: Secondary | ICD-10-CM

## 2012-10-21 NOTE — Patient Instructions (Addendum)
You have been scheduled for an endoscopy with propofol. Please follow written instructions given to you at your visit today. If you use inhalers (even only as needed), please bring them with you on the day of your procedure. Your physician has requested that you go to www.startemmi.com and enter the access code given to you at your visit today. This web site gives a general overview about your procedure. However, you should still follow specific instructions given to you by our office regarding your preparation for the procedure.   You have been given some information on acid reflux as well as a list of gerd medications we commonly use

## 2012-10-21 NOTE — Progress Notes (Signed)
Reviewed and agree with management. Journei Thomassen D. Quamaine Webb, M.D., FACG  

## 2012-10-21 NOTE — Progress Notes (Signed)
10/21/2012 Russell Thomas [id number] November 18, 1960   History of Present Illness:  Patient is a 52 year old male who is a patient of Dr. Marzetta Board.  He presents to our office today with complaints of reflux.  Says that he's had issues with reflux in the past, but it has come back again for the past several months and has particularly worsened over the past 6 weeks or so.  He had an EGD in 2003, which just showed some chronic gastritis.  He took omeprazole previously but had a reaction to it because there is sulfa in it (his wife is a Pharm D and looked into this when he had the reaction).  Recently he has just been taking Maalox but it does not help.  Has burning into his chest and throat.  Has the head of his bed elevated.  Has been on a diet since 8/28 and has lost 14 pounds.  Also has some LUQ discomfort.  Was drinking a lot of coffee previously but has weaned himself off of it.  Says that this new diet that he is doing consists of a lot of soy and whey.   Current Medications, Allergies, Past Medical History, Past Surgical History, Family History and Social History were reviewed in [employer] record.   Physical Exam: BP 134/80  Pulse 71  Ht 6\' 1"  (1.854 m)  Wt 267 lb (121.11 kg)  BMI 35.23 kg/m2 General: Well developed, white male in no acute distress Head: Normocephalic and atraumatic Eyes:  sclerae anicteric, conjunctiva pink  Ears: Normal auditory acuity Lungs: Clear throughout to auscultation Heart: Regular rate and rhythm Abdomen: Soft, non-tender and non-distended. No masses, no hepatomegaly. Normal bowel sounds. Musculoskeletal: Symmetrical with no gross deformities  Extremities: No edema  Neurological: Alert oriented x 4, grossly nonfocal Psychological:  Alert and cooperative. Normal mood and affect  Assessment and Recommendations: -GERD:  Will schedule for EGD.  Reflux dietary modification paperwork will be given.  Also, I gave him a list of PPI's and H2  blockers.  He is allergic to sulfa and apparently had a reaction to omeprazole so his wife, who is a Pharm D, is going to check on the meds for him to see which ones do not contain sulfa.  He will contact us for a prescription.

## 2012-10-22 ENCOUNTER — Telehealth: Payer: Self-pay | Admitting: Gastroenterology

## 2012-10-22 NOTE — Telephone Encounter (Signed)
Pt changed his appt time for his EGD and wanted to know when he had to stop drinking clear liquids with that appt time. Reviewed with pt that he will need to be NPO after 1pm. Pt verbalized understanding.

## 2012-11-03 ENCOUNTER — Ambulatory Visit (AMBULATORY_SURGERY_CENTER): Payer: PRIVATE HEALTH INSURANCE | Admitting: Gastroenterology

## 2012-11-03 ENCOUNTER — Encounter: Payer: Self-pay | Admitting: Gastroenterology

## 2012-11-03 VITALS — BP 122/84 | HR 63 | Temp 97.7°F | Resp 13 | Ht 73.0 in | Wt 267.0 lb

## 2012-11-03 DIAGNOSIS — K208 Other esophagitis without bleeding: Secondary | ICD-10-CM

## 2012-11-03 DIAGNOSIS — K219 Gastro-esophageal reflux disease without esophagitis: Secondary | ICD-10-CM

## 2012-11-03 HISTORY — PX: UPPER GASTROINTESTINAL ENDOSCOPY: SHX188

## 2012-11-03 MED ORDER — SODIUM CHLORIDE 0.9 % IV SOLN: 500.0000 mL | INTRAVENOUS | Status: DC

## 2012-11-03 NOTE — Progress Notes (Signed)
Patient did not experience any of the following events: a burn prior to discharge; a fall within the facility; wrong site/side/patient/procedure/implant event; or a hospital transfer or hospital admission upon discharge from the facility. (G8907) Patient did not have preoperative order for IV antibiotic SSI prophylaxis. (G8918)  

## 2012-11-03 NOTE — Op Note (Signed)
 [facility] [address] [place], [address]   ENDOSCOPY PROCEDURE REPORT  PATIENT: Russell, Thomas  MR#: [id number] BIRTHDATE: [birth year] , 52  yrs. old GENDER: Male ENDOSCOPIST: Louis Meckel, MD REFERRED BY:  Marga Melnick, M.D. PROCEDURE DATE:  11/03/2012 PROCEDURE:  EGD w/ biopsy ASA CLASS:     Class II INDICATIONS:  Heartburn. MEDICATIONS: MAC sedation, administered by CRNA, propofol (Diprivan) 200mg  IV, and Simethicone 0.6cc PO TOPICAL ANESTHETIC: Cetacaine Spray  DESCRIPTION OF PROCEDURE: After the risks benefits and alternatives of the procedure were thoroughly explained, informed consent was obtained.  The LB [id number] endoscope was introduced through the mouth and advanced to the third portion of the duodenum. Without limitations.  The instrument was slowly withdrawn as the mucosa was fully examined.      There were inflammatory changes at the GE junction characterized by edematous mucosa.  Biopsies were taken.   The remainder of the upper endoscopy exam was otherwise normal.  Retroflexed views revealed no abnormalities.     The scope was then withdrawn from the patient and the procedure completed.  COMPLICATIONS: There were no complications. ENDOSCOPIC IMPRESSION: 1.   There were inflammatory changes at the GE junction characterized by edematous mucosa.  Biopsies were taken. 2.   The remainder of the upper endoscopy exam was otherwise normal  RECOMMENDATIONS: 1.  Trial of protonix 40mg  1-2 times a day 2.  OV 2-3 weeks   REPEAT EXAM:  eSigned:  Louis Meckel, MD 11/03/2012 4:37 PM   CC:

## 2012-11-03 NOTE — Patient Instructions (Addendum)
YOU HAD AN ENDOSCOPIC PROCEDURE TODAY AT THE [facility]: Refer to the procedure report that was given to you for any specific questions about what was found during the examination.  If the procedure report does not answer your questions, please call your gastroenterologist to clarify.  If you requested that your care partner not be given the details of your procedure findings, then the procedure report has been included in a sealed envelope for you to review at your convenience later.  YOU SHOULD EXPECT: Some feelings of bloating in the abdomen. Passage of more gas than usual.  Walking can help get rid of the air that was put into your GI tract during the procedure and reduce the bloating. If you had a lower endoscopy (such as a colonoscopy or flexible sigmoidoscopy) you may notice spotting of blood in your stool or on the toilet paper. If you underwent a bowel prep for your procedure, then you may not have a normal bowel movement for a few days.  DIET: Your first meal following the procedure should be a light meal and then it is ok to progress to your normal diet.  A half-sandwich or bowl of soup is an example of a good first meal.  Heavy or fried foods are harder to digest and may make you feel nauseous or bloated.  Likewise meals heavy in dairy and vegetables can cause extra gas to form and this can also increase the bloating.  Drink plenty of fluids but you should avoid alcoholic beverages for 24 hours.  ACTIVITY: Your care partner should take you home directly after the procedure.  You should plan to take it easy, moving slowly for the rest of the day.  You can resume normal activity the day after the procedure however you should NOT DRIVE or use heavy machinery for 24 hours (because of the sedation medicines used during the test).    SYMPTOMS TO REPORT IMMEDIATELY: A gastroenterologist can be reached at any hour.  During normal business hours, 8:30 AM to 5:00 PM Monday through Friday,  call [phone].  After hours and on weekends, please call the GI answering service at [phone] who will take a message and have the physician on call contact you.    Following upper endoscopy (EGD)  Vomiting of blood or coffee ground material  New chest pain or pain under the shoulder blades  Painful or persistently difficult swallowing  New shortness of breath  Fever of 100F or higher  Black, tarry-looking stools  FOLLOW UP: If any biopsies were taken you will be contacted by phone or by letter within the next 1-3 weeks.  Call your gastroenterologist if you have not heard about the biopsies in 3 weeks.  Our staff will call the home number listed on your records the next business day following your procedure to check on you and address any questions or concerns that you may have at that time regarding the information given to you following your procedure. This is a courtesy call and so if there is no answer at the home number and we have not heard from you through the emergency physician on call, we will assume that you have returned to your regular daily activities without incident.  SIGNATURES/CONFIDENTIALITY: You and/or your care partner have signed paperwork which will be entered into your electronic medical record.  These signatures attest to the fact that that the information above on your After Visit Summary has been reviewed and is understood.  Full   responsibility of the confidentiality of this discharge information lies with you and/or your care-partner.  Resume medications. Sample boxes of Dexilant given to pt. Per physician order. Information given on esophagitis with discharge instructions.

## 2012-11-03 NOTE — Progress Notes (Signed)
Called to room to assist during endoscopic procedure.  Patient ID and intended procedure confirmed with present staff. Received instructions for my participation in the procedure from the performing physician.  

## 2012-11-04 ENCOUNTER — Telehealth: Payer: Self-pay | Admitting: *Deleted

## 2012-11-04 NOTE — Telephone Encounter (Signed)
  Follow up Call-  Call back number 11/03/2012  Post procedure Call Back phone  # [phone]  Permission to leave phone message Yes     Patient questions:  Do you have a fever, pain , or abdominal swelling? no Pain Score  0 *  Have you tolerated food without any problems? yes  Have you been able to return to your normal activities? yes  Do you have any questions about your discharge instructions: Diet   no Medications  no Follow up visit  no  Do you have questions or concerns about your Care? no  Actions: * If pain score is 4 or above: No action needed, pain <4.

## 2012-11-11 ENCOUNTER — Encounter: Payer: Self-pay | Admitting: Gastroenterology

## 2012-11-13 ENCOUNTER — Telehealth: Payer: Self-pay

## 2012-11-13 NOTE — Telephone Encounter (Signed)
Medication List and allergies: done  Pharmacy updated, uses [facility] for 90 day supply Pharmacy undated, uses [facility] for local prescriptions  HM UTD: yes     Immunizations due: flu, patient declined  A/P:  LAST: PSA: Not on file  CCS: Done 11/2010  DM: N/a    HTN: N/a  Lipids: N/a  To Discuss with Provider:

## 2012-11-14 ENCOUNTER — Ambulatory Visit (INDEPENDENT_AMBULATORY_CARE_PROVIDER_SITE_OTHER): Payer: PRIVATE HEALTH INSURANCE | Admitting: Internal Medicine

## 2012-11-14 ENCOUNTER — Encounter: Payer: Self-pay | Admitting: Internal Medicine

## 2012-11-14 VITALS — BP 119/74 | HR 77 | Temp 98.8°F | Ht 73.0 in | Wt 260.4 lb

## 2012-11-14 DIAGNOSIS — Z0000 Encounter for general adult medical examination without abnormal findings: Secondary | ICD-10-CM

## 2012-11-14 DIAGNOSIS — T887XXA Unspecified adverse effect of drug or medicament, initial encounter: Secondary | ICD-10-CM

## 2012-11-14 DIAGNOSIS — J302 Other seasonal allergic rhinitis: Secondary | ICD-10-CM

## 2012-11-14 DIAGNOSIS — Z872 Personal history of diseases of the skin and subcutaneous tissue: Secondary | ICD-10-CM | POA: Insufficient documentation

## 2012-11-14 DIAGNOSIS — H5712 Ocular pain, left eye: Secondary | ICD-10-CM | POA: Insufficient documentation

## 2012-11-14 DIAGNOSIS — J309 Allergic rhinitis, unspecified: Secondary | ICD-10-CM

## 2012-11-14 LAB — CBC WITH DIFFERENTIAL/PLATELET
Basophils Absolute: 0 10*3/uL (ref 0.0–0.1)
Eosinophils Absolute: 0 10*3/uL (ref 0.0–0.7)
HCT: 44.3 % (ref 39.0–52.0)
Lymphocytes Relative: 20.9 % (ref 12.0–46.0)
Lymphs Abs: 1.6 10*3/uL (ref 0.7–4.0)
MCHC: 34.2 g/dL (ref 30.0–36.0)
Monocytes Relative: 6.9 % (ref 3.0–12.0)
Neutrophils Relative %: 71.7 % (ref 43.0–77.0)
Platelets: 183 10*3/uL (ref 150.0–400.0)
RDW: 13.2 % (ref 11.5–14.6)
WBC: 7.7 10*3/uL (ref 4.5–10.5)

## 2012-11-14 LAB — BASIC METABOLIC PANEL
BUN: 13 mg/dL (ref 6–23)
Calcium: 9.2 mg/dL (ref 8.4–10.5)
GFR: 83.51 mL/min (ref >60.00)
Glucose, Bld: 102 mg/dL — ABNORMAL HIGH (ref 70–99)

## 2012-11-14 LAB — POCT URINALYSIS DIPSTICK
Blood, UA: NEGATIVE
Ketones, UA: NEGATIVE
Leukocytes, UA: NEGATIVE
Nitrite, UA: NEGATIVE
Protein, UA: NEGATIVE
Urobilinogen, UA: 0.2
pH, UA: 6

## 2012-11-14 LAB — LIPID PANEL
Cholesterol: 151 mg/dL (ref 0–200)
LDL Cholesterol: 94 mg/dL (ref 0–99)
Total CHOL/HDL Ratio: 4
Triglycerides: 70 mg/dL (ref 0.0–149.0)
VLDL: 14 mg/dL (ref 0.0–40.0)

## 2012-11-14 LAB — HEPATIC FUNCTION PANEL
AST: 25 U/L (ref 0–37)
Albumin: 4.5 g/dL (ref 3.5–5.2)
Alkaline Phosphatase: 55 U/L (ref 39–117)
Bilirubin, Direct: 0 mg/dL (ref 0.0–0.3)
Total Bilirubin: 0.7 mg/dL (ref 0.3–1.2)

## 2012-11-14 LAB — TSH: TSH: 1.26 u[IU]/mL (ref 0.35–5.50)

## 2012-11-14 NOTE — Addendum Note (Signed)
Addended byPecola Lawless on: 11/14/2012 10:32 AM   Modules accepted: Orders

## 2012-11-14 NOTE — Progress Notes (Signed)
  Subjective:    Patient ID: Russell Thomas, male    DOB: 04-24-1960, 52 y.o.   MRN: [id number]  HPI  He is here for a physical;acute issues include severe allergies to multiple agents. He requests an evaluation which is appropriate.     Review of Systems   He noted itching  and slight rash as well as a "scratchy throat" after ingesting shrimp 6 months ago. He has had a recent flare of his seasonal rhinitis. He started using an over-the-counter topical nasal steroid as well as his Neti pot.     Objective:   Physical Exam  Gen.:  well-nourished in appearance. Alert, appropriate and cooperative throughout exam. Head: Normocephalic without obvious abnormalities;  no alopecia  Eyes: No corneal or conjunctival inflammation noted. Pupils equal round reactive to light and accommodation.  Extraocular motion intact.  Ears: External  ear exam reveals no significant lesions or deformities. Wax on L. R TM normal.  Nose: External nasal exam reveals no deformity or inflammation. Nasal mucosa are pink and moist. No lesions or exudates noted. Septum slightly deviated Mouth: Oral mucosa and oropharynx reveal no lesions or exudates. Teeth in good repair. Neck: No deformities, masses, or tenderness noted. Range of motion & Thyroid normal. Lungs: Normal respiratory effort; chest expands symmetrically. Lungs are clear to auscultation without rales, wheezes, or increased work of breathing. Heart: Normal rate and rhythm. Normal S1 and S2. No gallop, click, or rub. No murmur. Abdomen: Bowel sounds normal; abdomen soft and nontender. No masses, organomegaly or hernias noted. Genitalia: Dr McDiarmid seen 8/14 for prostatitis                     Musculoskeletal/extremities: No deformity or scoliosis noted of  the thoracic or lumbar spine.  No clubbing, cyanosis, edema, or significant extremity  deformity noted. Range of motion normal .Tone & strength  Normal. Joints normal. Nail health good. Able to lie down & sit  up w/o help. Negative SLR bilaterally Vascular: Carotid, radial artery, dorsalis pedis and  posterior tibial pulses are full and equal. No bruits present. Neurologic: Alert and oriented x3. Deep tendon reflexes symmetrical and normal.         Skin: Intact without suspicious lesions or rashes. Lymph: No cervical, axillary lymphadenopathy present. Psych: Mood and affect are normal. Normally interactive                                                                                        Assessment & Plan:  #1 comprehensive physical exam; no acute findings  Plan: see Orders  & Recommendations

## 2012-11-14 NOTE — Patient Instructions (Addendum)
Plain Mucinex (NOT D) for thick secretions ;force NON dairy fluids .   Nasal cleansing in the shower as discussed with lather of mild shampoo.After 10 seconds wash off lather while  exhaling through nostrils. Make sure that all residual soap is removed to prevent irritation.  Fluticasone 1 spray in each nostril twice a day as needed. Use the "crossover" technique into opposite nostril spraying toward opposite ear @ 45 degree angle, not straight up into nostril.  Use a Neti pot daily only  as needed for significant sinus congestion; going from open side to congested side . Plain Allegra (NOT D )  160 daily , Loratidine 10 mg , OR Zyrtec 10 mg @ bedtime  as needed for itchy eyes & sneezing.   Share results with all non [facility] medical staff seen

## 2012-11-14 NOTE — Addendum Note (Signed)
Addended by: Silvio Pate D on: 11/14/2012 11:25 AM   Modules accepted: Orders

## 2012-11-17 ENCOUNTER — Ambulatory Visit: Payer: PRIVATE HEALTH INSURANCE

## 2012-11-17 DIAGNOSIS — R7309 Other abnormal glucose: Secondary | ICD-10-CM

## 2012-11-20 ENCOUNTER — Encounter: Payer: PRIVATE HEALTH INSURANCE | Admitting: Gastroenterology

## 2012-11-25 ENCOUNTER — Ambulatory Visit: Payer: PRIVATE HEALTH INSURANCE | Admitting: Gastroenterology

## 2012-12-10 ENCOUNTER — Ambulatory Visit: Payer: PRIVATE HEALTH INSURANCE | Admitting: Gastroenterology

## 2012-12-11 ENCOUNTER — Other Ambulatory Visit: Payer: Self-pay

## 2013-02-05 DIAGNOSIS — K829 Disease of gallbladder, unspecified: Secondary | ICD-10-CM

## 2013-02-05 HISTORY — DX: Disease of gallbladder, unspecified: K82.9

## 2013-03-27 ENCOUNTER — Encounter: Payer: Self-pay | Admitting: Cardiovascular Disease

## 2013-03-27 ENCOUNTER — Ambulatory Visit (INDEPENDENT_AMBULATORY_CARE_PROVIDER_SITE_OTHER): Payer: PRIVATE HEALTH INSURANCE | Admitting: Cardiovascular Disease

## 2013-03-27 VITALS — BP 140/84 | HR 71 | Wt 277.0 lb

## 2013-03-27 DIAGNOSIS — R079 Chest pain, unspecified: Secondary | ICD-10-CM

## 2013-03-27 DIAGNOSIS — R0989 Other specified symptoms and signs involving the circulatory and respiratory systems: Secondary | ICD-10-CM

## 2013-03-27 DIAGNOSIS — K219 Gastro-esophageal reflux disease without esophagitis: Secondary | ICD-10-CM

## 2013-03-27 DIAGNOSIS — G4733 Obstructive sleep apnea (adult) (pediatric): Secondary | ICD-10-CM

## 2013-03-27 DIAGNOSIS — R0600 Dyspnea, unspecified: Secondary | ICD-10-CM

## 2013-03-27 DIAGNOSIS — R0609 Other forms of dyspnea: Secondary | ICD-10-CM

## 2013-03-27 DIAGNOSIS — E785 Hyperlipidemia, unspecified: Secondary | ICD-10-CM

## 2013-03-27 NOTE — Assessment & Plan Note (Signed)
Atypical May be related to anxiety F/U ETT and echo given PV"C on ECG

## 2013-03-27 NOTE — Patient Instructions (Signed)
Your physician recommends that you continue on your current medications as directed. Please refer to the Current Medication list given to you today.  Your physician has requested that you have an echocardiogram. Echocardiography is a painless test that uses sound waves to create images of your heart. It provides your doctor with information about the size and shape of your heart and how well your heart's chambers and valves are working. This procedure takes approximately one hour. There are no restrictions for this procedure.  Your physician has requested that you have an exercise tolerance test. For further information please visit www.[email or web address]. Please also follow instruction sheet, as given.  Your physician recommends that you schedule a follow-up appointment As Needed 

## 2013-03-27 NOTE — Assessment & Plan Note (Signed)
Discussed relation to obesity  F/u primary to consider CPAP and sleep study

## 2013-03-27 NOTE — Progress Notes (Signed)
Patient ID: Russell Thomas, male   DOB: 08/22/1960, 53 y.o.   MRN: [id number]   53 yo self referred.  Atypical chest symptoms for 6 months.  Burning sensation in left pectoral area.  Daily and related to anxiety.  Feels better when he presses on chest Not related to exertion.  Family history of CAD  Has history of GERD/Reflux with previous EGD by Dr Deatra Ina Rx for this has not helped symptoms in last 6 months  Nothing seems to make better.  No GI overtones.  Exacerbated by anxiety Has PRN valium for sleep but has not been evaluated by psychiatry before.  Sedentary and weight is the highest it has been.  Poor high carb diet  Denies excess ETOH and non smoker       ROS: Denies fever, malais, weight loss, blurry vision, decreased visual acuity, cough, sputum, SOB, hemoptysis, pleuritic pain, palpitaitons, heartburn, abdominal pain, melena, lower extremity edema, claudication, or rash.  All other systems reviewed and negative   General: Affect appropriate Overweight white male  HEENT: normal Neck supple with no adenopathy JVP normal no bruits no thyromegaly Lungs clear with no wheezing and good diaphragmatic motion Heart:  S1/S2 no murmur,rub, gallop or click PMI normal Abdomen: benighn, BS positve, no tenderness, no AAA no bruit.  No HSM or HJR Distal pulses intact with no bruits No edema Neuro non-focal Skin warm and dry No muscular weakness  Medications Current Outpatient Prescriptions  Medication Sig Dispense Refill  . Alum & Mag Hydroxide-Simeth (MAALOX ADVANCED PO) Take by mouth as directed.      . Loratadine 10 MG CAPS Take by mouth. As needed      . LORazepam (ATIVAN) 0.5 MG tablet TAKE 1 TABLET BY MOUTH EVERY 8 HOURS  30 tablet  1  . triamcinolone (NASACORT) 55 MCG/ACT nasal inhaler Place 2 sprays into the nose daily.       No current facility-administered medications for this visit.    Allergies Cephalosporins; Ciprofloxacin; Contrast media; Omeprazole; Penicillins;  Shrimp; and Sulfonamide derivatives  Family History: Family History  Problem Relation Age of Onset  . Thyroid disease Mother     hypothyroidism  . Allergies Mother   . Parkinson's disease Mother     not definite  . Colitis Father   . Alcohol abuse Paternal Grandfather   . Diabetes Neg Hx   . Heart disease Neg Hx   . Stroke Neg Hx     Social History: History   Social History  . Marital Status: Married    Spouse Name: N/A    Number of Children: 1  . Years of Education: N/A   Occupational History  . Sales    Social History Main Topics  . Smoking status: Current Some Day Smoker    Types: Cigars  . Smokeless tobacco: Never Used     Comment:  RARE ; <1 cigar / YEAR  . Alcohol Use: Yes     Comment: rare ; 0-1 drink / month  . Drug Use: No  . Sexual Activity: Not on file   Other Topics Concern  . Not on file   Social History Narrative   NO REG EXERCISE    Electrocardiogram:  SR PVC otherwise normal   Assessment and Plan

## 2013-03-27 NOTE — Assessment & Plan Note (Signed)
Seems contorlled and not related to current symptoms F/U Dr Deatra Ina

## 2013-03-27 NOTE — Assessment & Plan Note (Signed)
Discussed low carb low fat diet Cholesterol is at goal.  Continue current dose of statin and diet Rx.  No myalgias or side effects.  F/U  LFT's in 6 months. Lab Results  Component Value Date   LDLCALC 94 11/14/2012

## 2013-04-02 ENCOUNTER — Ambulatory Visit (HOSPITAL_COMMUNITY): Payer: PRIVATE HEALTH INSURANCE

## 2013-04-08 ENCOUNTER — Ambulatory Visit (HOSPITAL_COMMUNITY)
Admission: RE | Admit: 2013-04-08 | Discharge: 2013-04-08 | Disposition: A | Discharge status: Home / Self Care | Payer: PRIVATE HEALTH INSURANCE | Source: Ambulatory Visit | Attending: Cardiovascular Disease | Admitting: Cardiovascular Disease

## 2013-04-08 DIAGNOSIS — R079 Chest pain, unspecified: Secondary | ICD-10-CM | POA: Insufficient documentation

## 2013-04-10 ENCOUNTER — Telehealth: Payer: Self-pay | Admitting: *Deleted

## 2013-04-10 NOTE — Telephone Encounter (Signed)
PT AWARE OF STRESS TEST RESULTS./CY 

## 2013-04-10 NOTE — Telephone Encounter (Signed)
[name] More Detail >>      [name], MD      Sent: Fri April 10, 2013  8:04 AM      To: [name], LPN              Message      Normal ETT             ----- Message -----         From: [name]         Sent: 04/10/2013   6:38 AM           To: [name], MD                                         Results   Exercise Tolerance Test (Order [id number])        Exercise Tolerance Test  Status: Final result     Visible to patient: This result is not viewable by the patient.     Next appt: 04/13/2013 at 10:00 AM in Cardiology ([facility])            Specimen Collected: 04/08/13  9:47 AM Last Resulted: 04/09/13  1:50 PM           Linked Documents      View Image                             Encounter      View Encounter            Result Information      Status      Final result (04/09/2013  1:50 PM)      Provider Status: Ordered               Lab Information      MUSE                             Order-Level Documents:      There are no order-level documents.           Exercise Tolerance Test (Order [id number])  ECG  Order: [id number]   Released By: Auto-released  Authorizing: Provider Default, MD   Date: 04/09/2013  Department: [facility] [place]               Provider Default, MD  NPI:                Order Information      Order Date/Time Release Date/Time Start Date/Time End Date/Time      04/08/13 09:47 AM 04/08/13 09:47 AM 04/08/13 09:47 AM 04/08/13 09:47 AM              Order Details      Frequency Duration Priority Order Class      Once 1  occurrence Routine Normal              Comments      Ordered by [name]               Order History Inpatient      Date/Time Action Taken User Additional Information      04/08/13 0947 Release   From Order: [id number]  04/09/13 1350 Result Lab In [facility] Interface Final           Collection Information      Collected: 04/08/2013  9:47 AM   Resulting Agency: [facility]                          Patient Information      Patient Name Sex DOB SSN      Russell Thomas, Russell Thomas Male Apr 18, 1960 [id number]              Additional Information      Associated Reports      View Parent Encounter      Priority and Order Details      Collection Information.               Order Art therapist [facility] In [place] Provider Provider Default, MD -- -- --                Encounter      View Encounter             Order-Level Documents:      There are no order-level documents.         LMTCB .Adonis Housekeeper

## 2013-04-13 ENCOUNTER — Ambulatory Visit (HOSPITAL_COMMUNITY)
Admission: RE | Admit: 2013-04-13 | Discharge: 2013-04-13 | Disposition: A | Discharge status: Home / Self Care | Payer: PRIVATE HEALTH INSURANCE | Source: Ambulatory Visit | Attending: Cardiovascular Disease | Admitting: Cardiovascular Disease

## 2013-04-13 DIAGNOSIS — R0609 Other forms of dyspnea: Secondary | ICD-10-CM | POA: Insufficient documentation

## 2013-04-13 DIAGNOSIS — R0989 Other specified symptoms and signs involving the circulatory and respiratory systems: Principal | ICD-10-CM | POA: Insufficient documentation

## 2013-04-13 DIAGNOSIS — I517 Cardiomegaly: Secondary | ICD-10-CM

## 2013-04-13 DIAGNOSIS — R0602 Shortness of breath: Secondary | ICD-10-CM

## 2013-04-13 DIAGNOSIS — R0600 Dyspnea, unspecified: Secondary | ICD-10-CM

## 2013-04-13 NOTE — Progress Notes (Signed)
2D Echo Performed 04/13/2013    Marygrace Drought, RCS

## 2013-05-17 ENCOUNTER — Encounter: Payer: Self-pay | Admitting: Internal Medicine

## 2013-05-18 ENCOUNTER — Encounter: Payer: Self-pay | Admitting: Gastroenterology

## 2013-05-18 ENCOUNTER — Telehealth: Payer: Self-pay | Admitting: Gastroenterology

## 2013-05-18 DIAGNOSIS — F449 Dissociative and conversion disorder, unspecified: Secondary | ICD-10-CM

## 2013-05-18 NOTE — Telephone Encounter (Signed)
Pt sent a pt advice request regarding this same issue. Note sent to Dr. Deatra Ina.

## 2013-05-18 NOTE — Telephone Encounter (Signed)
Please inform the patient that I have only seen him for gastrointestinal disorders and that I've been never made a comment nor placed anything in the chart about a conversion disorder.  was placed in my name and address.  I have no problem from the chart.

## 2013-05-18 NOTE — Telephone Encounter (Signed)
Pt requests to speak with Dr. Deatra Ina. Does not agree with diagnosis that was entered in epic for Conversion Disorder. Pt communicated with Dr. Linna Darner his PCP regarding this and was told by Dr. Linna Darner that Dr. Deatra Ina was the physician that entered this. Pt would like for this to be removed from his chart and to speak with Dr. Deatra Ina.

## 2013-05-19 NOTE — Telephone Encounter (Signed)
I explained to the patient that I did not make the entry but will see to it that it is expunged from his record.

## 2013-05-20 ENCOUNTER — Other Ambulatory Visit: Payer: Self-pay

## 2013-05-20 MED ORDER — LORAZEPAM 0.5 MG PO TABS: ORAL_TABLET | ORAL | Status: DC

## 2013-05-20 NOTE — Telephone Encounter (Signed)
Last ov 11/14/12 Med last filled 06/07/12 #30 plus 1

## 2013-06-01 ENCOUNTER — Encounter: Payer: Self-pay | Admitting: Internal Medicine

## 2013-06-01 ENCOUNTER — Ambulatory Visit (INDEPENDENT_AMBULATORY_CARE_PROVIDER_SITE_OTHER): Payer: PRIVATE HEALTH INSURANCE | Admitting: Internal Medicine

## 2013-06-01 ENCOUNTER — Telehealth: Payer: Self-pay | Admitting: Internal Medicine

## 2013-06-01 VITALS — BP 130/72 | HR 104 | Temp 98.8°F | Resp 14 | Wt 277.8 lb

## 2013-06-01 DIAGNOSIS — R7309 Other abnormal glucose: Secondary | ICD-10-CM

## 2013-06-01 DIAGNOSIS — R740 Nonspecific elevation of levels of transaminase and lactic acid dehydrogenase [LDH]: Principal | ICD-10-CM

## 2013-06-01 DIAGNOSIS — R7401 Elevation of levels of liver transaminase levels: Secondary | ICD-10-CM

## 2013-06-01 DIAGNOSIS — K8020 Calculus of gallbladder without cholecystitis without obstruction: Secondary | ICD-10-CM

## 2013-06-01 DIAGNOSIS — E785 Hyperlipidemia, unspecified: Secondary | ICD-10-CM

## 2013-06-01 NOTE — Patient Instructions (Signed)
Your next office appointment will be determined based upon review of your pending labs . Those instructions will be transmitted to you through My Chart   Followup as needed for your acute issue. Please report any significant change in your symptoms. I recommend a General Surgery consultation to determine optimal therapy; please inform me if you have a physician preference.

## 2013-06-01 NOTE — Telephone Encounter (Signed)
Relevant patient education assigned to patient using Emmi. ° °

## 2013-06-01 NOTE — Progress Notes (Signed)
   Subjective:    Patient ID: Russell Thomas, male    DOB: 03-12-1960, 53 y.o.   MRN: [id number]  HPI  He was seen at the [facility] ER 05/28/13 with right lateral infrascapular area pain. He was found to have cholelithiasis on CT. There's also noted  elevated ALT to 111 and AST to 121. Total bilirubin was normal. Nonfasting glucose was 132.   He was discharged on hydrocodone/acetaminophen.  He drinks alcohol minimally. He denies excess ingestion of Tylenol or vitamin A. His last triglycerides were 70 on 11/14/12.    Review of Systems  Since the emergency room visit he has had malaise; he denies recurrent abdominal or flank pain.  He is not having fever, chills, or sweats  There's been no change in color of his urine or stool.      Objective:   Physical Exam General appearance is one of good health and nourishment w/o distress.Weight excess  Eyes: No conjunctival inflammation or scleral icterus is present.  Oral exam: Dental hygiene is good; lips and gums are healthy appearing.There is no oropharyngeal erythema or exudate noted.   Heart: Slightly elevated  rate and regular rhythm. S1 and S2 normal without gallop, murmur, click, rub or other extra sounds     Lungs:Chest clear to auscultation; no wheezes, rhonchi,rales ,or rubs present.No increased work of breathing.   Abdomen: bowel sounds normal, soft and non-tender without masses, organomegaly or hernias noted.  No guarding or rebound . No tenderness over the flanks to percussion  Musculoskeletal: Able to lie flat and sit up without help. Negative straight leg raising bilaterally. Gait normal  Skin:Warm & dry.  Intact without suspicious lesions or rashes ; no jaundice or tenting  Lymphatic: No lymphadenopathy is noted about the head, neck, axilla.                Assessment & Plan:  #1 right infrascapular/ lateral thoracic pain  #2 cholelithiasis without cholangitis  #3 elevated AST and ALT with normal  bilirubin   #4 hyperglycemia  Options discussed; see orders

## 2013-06-01 NOTE — Progress Notes (Signed)
Pre visit review using our clinic review tool, if applicable. No additional management support is needed unless otherwise documented below in the visit note. 

## 2013-06-02 ENCOUNTER — Other Ambulatory Visit (INDEPENDENT_AMBULATORY_CARE_PROVIDER_SITE_OTHER): Payer: PRIVATE HEALTH INSURANCE

## 2013-06-02 DIAGNOSIS — R7401 Elevation of levels of liver transaminase levels: Secondary | ICD-10-CM

## 2013-06-02 DIAGNOSIS — R7309 Other abnormal glucose: Secondary | ICD-10-CM

## 2013-06-02 DIAGNOSIS — E785 Hyperlipidemia, unspecified: Secondary | ICD-10-CM

## 2013-06-02 DIAGNOSIS — R740 Nonspecific elevation of levels of transaminase and lactic acid dehydrogenase [LDH]: Principal | ICD-10-CM

## 2013-06-02 LAB — HEPATIC FUNCTION PANEL
ALBUMIN: 4.4 g/dL (ref 3.5–5.2)
ALT: 71 U/L — ABNORMAL HIGH (ref 0–53)
AST: 25 U/L (ref 0–37)
Alkaline Phosphatase: 56 U/L (ref 39–117)
BILIRUBIN TOTAL: 0.5 mg/dL (ref 0.3–1.2)
Bilirubin, Direct: 0.1 mg/dL (ref 0.0–0.3)
Total Protein: 7.2 g/dL (ref 6.0–8.3)

## 2013-06-02 LAB — LIPID PANEL
CHOL/HDL RATIO: 4
Cholesterol: 157 mg/dL (ref 0–200)
HDL: 43 mg/dL (ref >39.00)
LDL Cholesterol: 96 mg/dL (ref 0–99)
TRIGLYCERIDES: 89 mg/dL (ref 0.0–149.0)
VLDL: 17.8 mg/dL (ref 0.0–40.0)

## 2013-06-02 LAB — HEMOGLOBIN A1C: Hgb A1c MFr Bld: 5.2 % (ref 4.6–6.5)

## 2013-06-04 ENCOUNTER — Telehealth: Payer: Self-pay | Admitting: Gastroenterology

## 2013-06-04 NOTE — Telephone Encounter (Signed)
Amendment request received 06/03/13 DAJ

## 2013-06-16 ENCOUNTER — Encounter: Payer: Self-pay | Admitting: Gastroenterology

## 2013-07-03 ENCOUNTER — Encounter: Payer: Self-pay | Admitting: Gastroenterology

## 2013-07-03 DIAGNOSIS — F449 Dissociative and conversion disorder, unspecified: Secondary | ICD-10-CM

## 2013-07-13 ENCOUNTER — Encounter: Payer: Self-pay | Admitting: Gastroenterology

## 2013-08-11 ENCOUNTER — Telehealth: Payer: Self-pay | Admitting: Gastroenterology

## 2013-08-11 NOTE — Telephone Encounter (Signed)
Chart Amendment request completed, letter mailed.rmf

## 2013-09-11 ENCOUNTER — Encounter: Payer: Self-pay | Admitting: Internal Medicine

## 2013-09-11 ENCOUNTER — Other Ambulatory Visit (INDEPENDENT_AMBULATORY_CARE_PROVIDER_SITE_OTHER): Payer: PRIVATE HEALTH INSURANCE

## 2013-09-11 ENCOUNTER — Ambulatory Visit (INDEPENDENT_AMBULATORY_CARE_PROVIDER_SITE_OTHER): Payer: PRIVATE HEALTH INSURANCE | Admitting: Internal Medicine

## 2013-09-11 VITALS — BP 138/83 | HR 105 | Temp 98.2°F | Wt 284.5 lb

## 2013-09-11 DIAGNOSIS — R52 Pain, unspecified: Secondary | ICD-10-CM

## 2013-09-11 DIAGNOSIS — K8020 Calculus of gallbladder without cholecystitis without obstruction: Secondary | ICD-10-CM

## 2013-09-11 DIAGNOSIS — D690 Allergic purpura: Secondary | ICD-10-CM

## 2013-09-11 DIAGNOSIS — J309 Allergic rhinitis, unspecified: Secondary | ICD-10-CM

## 2013-09-11 LAB — CBC WITH DIFFERENTIAL/PLATELET
BASOS ABS: 0.1 10*3/uL (ref 0.0–0.1)
Basophils Relative: 0.5 % (ref 0.0–3.0)
Eosinophils Absolute: 0.1 10*3/uL (ref 0.0–0.7)
Eosinophils Relative: 0.5 % (ref 0.0–5.0)
HEMATOCRIT: 44.6 % (ref 39.0–52.0)
Hemoglobin: 15.3 g/dL (ref 13.0–17.0)
Lymphocytes Relative: 24.4 % (ref 12.0–46.0)
Lymphs Abs: 2.4 10*3/uL (ref 0.7–4.0)
MCHC: 34.3 g/dL (ref 30.0–36.0)
MCV: 91.6 fl (ref 78.0–100.0)
MONO ABS: 0.6 10*3/uL (ref 0.1–1.0)
MONOS PCT: 6.1 % (ref 3.0–12.0)
NEUTROS PCT: 68.5 % (ref 43.0–77.0)
Neutro Abs: 6.7 10*3/uL (ref 1.4–7.7)
PLATELETS: 218 10*3/uL (ref 150.0–400.0)
RBC: 4.87 Mil/uL (ref 4.22–5.81)
RDW: 13.5 % (ref 11.5–15.5)
WBC: 9.8 10*3/uL (ref 4.0–10.5)

## 2013-09-11 LAB — URINALYSIS
Bilirubin Urine: NEGATIVE
Hgb urine dipstick: NEGATIVE
KETONES UR: NEGATIVE
LEUKOCYTES UA: NEGATIVE
Nitrite: NEGATIVE
PH: 5.5 (ref 5.0–8.0)
SPECIFIC GRAVITY, URINE: 1.02 (ref 1.000–1.030)
TOTAL PROTEIN, URINE-UPE24: NEGATIVE
URINE GLUCOSE: NEGATIVE
Urobilinogen, UA: 0.2 (ref 0.0–1.0)

## 2013-09-11 LAB — HEPATIC FUNCTION PANEL
ALK PHOS: 50 U/L (ref 39–117)
ALT: 36 U/L (ref 0–53)
AST: 26 U/L (ref 0–37)
Albumin: 4.4 g/dL (ref 3.5–5.2)
BILIRUBIN DIRECT: 0 mg/dL (ref 0.0–0.3)
BILIRUBIN TOTAL: 0.6 mg/dL (ref 0.2–1.2)
Total Protein: 7.2 g/dL (ref 6.0–8.3)

## 2013-09-11 MED ORDER — HYDROXYZINE HCL 10 MG PO TABS: 10.0000 mg | ORAL_TABLET | Freq: Three times a day (TID) | ORAL | Status: DC | PRN

## 2013-09-11 MED ORDER — RANITIDINE HCL 150 MG PO TABS: 150.0000 mg | ORAL_TABLET | Freq: Two times a day (BID) | ORAL | Status: DC

## 2013-09-11 MED ORDER — MONTELUKAST SODIUM 10 MG PO TABS: 10.0000 mg | ORAL_TABLET | Freq: Every day | ORAL | Status: DC

## 2013-09-11 NOTE — Progress Notes (Signed)
   Subjective:    Patient ID: Russell Thomas, male    DOB: 1960/04/24, 53 y.o.   MRN: [id number]  HPI   He describes a diffuse itching over his extremities, abdomen, & thorax for several months. This has been associated with scattered irregular rash which is dry and non vesicular ; this will resolve only to recur.  He also has migratory burning in the spine, left shoulder, & in  the abdomen.  He questions whether this is related to his known gallbladder stones. He was seen in the [facility] ER and it was recommended he followup to consider cholecystectomy.  He does have seasonal allergies, mainly to ragweed. This can be associated with itchy, watery eyes. He also can have slight hoarseness.  The burning in the left shoulder area is not exertional in nature.  Lorazepam rarely taken; last dose > 1 month ago  Review of Systems  He denies associated angioedema, wheezing, shortness of breath  His weight has actually increased  He has no fever, chills, sweats.  He  denies constipation, diarrhea, melena, or rectal bleeding.  No clay colored stool or coke colored urine.      Objective:   Physical Exam  Positive for pertinent findings include:  He has wax in both otic canals, greater on the left. An S4 gallop is present with slight tachycardia Abdomen is protuberant. He has small,scattered bland appearing faintly erythematous lesioins over the abdomen, trunk, thighs, upper extremities. These areas blanch with pressure. There is no vesicle formation or maceration. He also has some mild dermatographia.  General appearance :adequately nourished; in no distress. Eyes: No conjunctival inflammation or scleral icterus is present. Oral exam: Dental hygiene is good. Lips and gums are healthy appearing.There is no oropharyngeal erythema or exudate noted.  Heart:  Normal rate and regular rhythm. S1 and S2 normal without gallop, murmur, click, rub or other extra sounds  Lungs:Chest clear to  auscultation; no wheezes, rhonchi,rales ,or rubs present.No increased work of breathing.  Abdomen: bowel sounds normal, soft and non-tender without masses, organomegaly or hernias noted.  No guarding or rebound. No flank tenderness to percussion. Skin:Warm & dry.  Intact without suspicious lesions or rashes ; no jaundice or tenting Lymphatic: No lymphadenopathy is noted about the head, neck, axilla            Assessment & Plan:  #1 hypersensitivity vasculitis  #2 migratory burning of ? Etiology #3 gall stones #4 allergic rhinitis See orders

## 2013-09-11 NOTE — Progress Notes (Signed)
Pre visit review using our clinic review tool, if applicable. No additional management support is needed unless otherwise documented below in the visit note. 

## 2013-09-11 NOTE — Patient Instructions (Signed)
Avoid perfumes and cosmetics which are not hypoallergenic. Restrict hyperallergenic foods at this time: Nuts, strawberries, seafood , chocolate, and tomatoes. Take the ranitidine 30 minutes before breakfast and evening meal. This blocks a histamine pathway as we discussed

## 2013-09-14 ENCOUNTER — Encounter: Payer: Self-pay | Admitting: Internal Medicine

## 2013-09-21 ENCOUNTER — Ambulatory Visit: Payer: PRIVATE HEALTH INSURANCE | Admitting: Internal Medicine

## 2013-09-23 ENCOUNTER — Ambulatory Visit (INDEPENDENT_AMBULATORY_CARE_PROVIDER_SITE_OTHER): Payer: PRIVATE HEALTH INSURANCE | Admitting: Internal Medicine

## 2013-09-23 ENCOUNTER — Encounter: Payer: Self-pay | Admitting: Internal Medicine

## 2013-09-23 ENCOUNTER — Other Ambulatory Visit (INDEPENDENT_AMBULATORY_CARE_PROVIDER_SITE_OTHER): Payer: PRIVATE HEALTH INSURANCE

## 2013-09-23 ENCOUNTER — Ambulatory Visit (INDEPENDENT_AMBULATORY_CARE_PROVIDER_SITE_OTHER)
Admission: RE | Admit: 2013-09-23 | Discharge: 2013-09-23 | Disposition: A | Discharge status: Home / Self Care | Payer: PRIVATE HEALTH INSURANCE | Source: Ambulatory Visit | Attending: Internal Medicine | Admitting: Internal Medicine

## 2013-09-23 VITALS — BP 134/84 | HR 117 | Temp 98.4°F | Wt 284.5 lb

## 2013-09-23 DIAGNOSIS — R058 Other specified cough: Secondary | ICD-10-CM

## 2013-09-23 DIAGNOSIS — R05 Cough: Secondary | ICD-10-CM

## 2013-09-23 DIAGNOSIS — R0789 Other chest pain: Secondary | ICD-10-CM

## 2013-09-23 DIAGNOSIS — R059 Cough, unspecified: Secondary | ICD-10-CM

## 2013-09-23 DIAGNOSIS — M792 Neuralgia and neuritis, unspecified: Secondary | ICD-10-CM

## 2013-09-23 DIAGNOSIS — R000 Tachycardia, unspecified: Secondary | ICD-10-CM

## 2013-09-23 DIAGNOSIS — IMO0002 Reserved for concepts with insufficient information to code with codable children: Secondary | ICD-10-CM

## 2013-09-23 LAB — MAGNESIUM: Magnesium: 2 mg/dL (ref 1.5–2.5)

## 2013-09-23 LAB — TSH: TSH: 1.34 u[IU]/mL (ref 0.35–4.50)

## 2013-09-23 LAB — BASIC METABOLIC PANEL
BUN: 14 mg/dL (ref 6–23)
CALCIUM: 9.1 mg/dL (ref 8.4–10.5)
CO2: 27 mEq/L (ref 19–32)
CREATININE: 1.1 mg/dL (ref 0.4–1.5)
Chloride: 104 mEq/L (ref 96–112)
GFR: 73.71 mL/min (ref >60.00)
GLUCOSE: 86 mg/dL (ref 70–99)
Potassium: 4.2 mEq/L (ref 3.5–5.1)
Sodium: 140 mEq/L (ref 135–145)

## 2013-09-23 LAB — T4, FREE: Free T4: 0.8 ng/dL (ref 0.60–1.60)

## 2013-09-23 NOTE — Progress Notes (Signed)
   Subjective:    Patient ID: Russell Thomas, male    DOB: Apr 24, 1960, 53 y.o.   MRN: [id number]  HPI Patient seen today in follow up for rash and burning pain.  He states diffuse rash has improved with Singulair and Loratidine.  He is using Hydroxyzine at night for itching.  Still recurs occasionally, but symptoms improved.   Buring pain still persistent and is mostly present in left shoulder, spine, and abdominal area (unchanged from previous visit).   He is followed by Dr Neldon Mc for allergies with latest testing 01/2013.   No fevers, chills, unexpected weight loss.  He is gaining weight despite attempts at healthy diet and exercise.   Denies angioedema, wheezing, shortness of breath.    Past Medical History  Diagnosis Date  . Sleep apnea     [facility]  . Hyperlipemia 2009  . Adenomatous colon polyp 2008    Dr Deatra Ina, GI  . Diverticulosis   . IBS (irritable bowel syndrome)   . Allergic rhinitis   . Internal hemorrhoids 2012    banded   . Gastritis   . Lichen planus     Past Surgical History  Procedure Laterality Date  . Septoplasty    . Thumb surgery    . Tonsillectomy    . Colonoscopy  04/10/2010, 08/12/2006    3/12 with banding; 2008: adenomatous polyp, diverticulosis  . Upper gastrointestinal endoscopy  10/24/2001    gastritis  . Upper gastrointestinal endoscopy  11/03/12     Review of Systems  Constitutional: Negative for fever, chills, activity change, appetite change and unexpected weight change.  HENT: Negative for congestion, ear discharge, ear pain, postnasal drip, rhinorrhea, sinus pressure and sore throat.   Eyes: Negative for pain and itching.  Respiratory: Negative for cough, chest tightness, shortness of breath and wheezing.   Cardiovascular: Negative for chest pain, palpitations and leg swelling.  Gastrointestinal: Negative for nausea, vomiting, diarrhea and constipation.  Endocrine: Negative for cold intolerance and heat  intolerance.  Skin: Positive for rash (see HPI).  Neurological: Negative for dizziness, weakness, numbness and headaches.       Objective:   Physical Exam  Constitutional: He is oriented to person, place, and time. He appears well-developed and well-nourished. No distress.  HENT:  Head: Normocephalic and atraumatic.  Right Ear: External ear normal.  Left Ear: External ear normal.  Nose: Nose normal.  Mouth/Throat: Oropharynx is clear and moist. No oropharyngeal exudate.  L ear with significant cerumen  Cardiovascular: Normal rate, regular rhythm and normal heart sounds.   No murmur heard. Pulmonary/Chest: Effort normal and breath sounds normal. No respiratory distress. He has no wheezes.  Abdominal: Soft. Bowel sounds are normal. He exhibits no distension. There is no tenderness.  Musculoskeletal: Normal range of motion.  Neurological: He is alert and oriented to person, place, and time.  Skin: Skin is warm and dry. He is not diaphoretic.  Psychiatric: He has a normal mood and affect. His behavior is normal. Judgment and thought content normal.    BP 134/84  Pulse 117  Temp(Src) 98.4 F (36.9 C) (Oral)  Wt 284 lb 8 oz (129.048 kg)  SpO2 97%  Pulse on recheck 90      Assessment & Plan:

## 2013-09-23 NOTE — Patient Instructions (Addendum)
Your next office appointment will be determined based upon review of your pending labs & x-rays. Those instructions will be transmitted to you through My Chart .   If these studies are negative; I recommend Dermatology consultation with Dr Tamala Julian in [facility]  for definitive diagnosis of the dermatitis.

## 2013-09-23 NOTE — Progress Notes (Signed)
Pre visit review using our clinic review tool, if applicable. No additional management support is needed unless otherwise documented below in the visit note. 

## 2013-09-23 NOTE — Progress Notes (Signed)
   Subjective:    Patient ID: Russell Thomas, male    DOB: 1960-12-31, 53 y.o.   MRN: [id number]  HPI    He has seen his Neurologist,Dr Joretta Bachelor, who treats his sleep apnea with CPAP. He discussed the migratory pains he has down his spine and in the left upper chest.  She performed C reactive protein which was elevated at 8.5. B12 level was supra normal and sedimentation rate was also normal at 15. ANA was negative.  He continues to have burning pain in the left upper chest which is intermittent and can last 30-120 minutes. It is not definitely exertional.  He also has generalized burning across the abdomen intermittently.  The burning in the spine appears to be in a straight line down the spine from a level of T2 to T12.  His rash has persisted but has improved with Singulair. It is intermittent as well.  His mother has history of atrial fibrillation.  He has no symptoms or signs of infection although he's had some sputum production for the last few months. This is not associated with rhinosinusitis symptoms.   EKG in February revealed some fusion beats but was otherwise normal. Stress test in March was negative.  PMH of Lichen planus & urticaria.     Review of Systems  Frontal headache, facial pain , nasal purulence, dental pain, sore throat , otic pain or otic discharge denied. No fever , chills or sweats.      Objective:   Physical Exam  Positive findings included : Skin is slightly damp to palpation. Repeat pulse 90. Abdomen is protuberant without organomegaly, masses, or tenderness He has scattered small ( < 2 cm) patches of bland eczematoid type rash over the upper extremities which blanch with pressure. Homans sign is negative bilaterally.  General appearance :adequately nourished; in no distress. Eyes: No conjunctival inflammation or scleral icterus is present. Oral exam: Dental hygiene is good. Lips and gums are healthy appearing.There is no oropharyngeal erythema  or exudate noted.  Heart:  Normal rate and regular rhythm. S1 and S2 normal without gallop, murmur, click, rub or other extra sounds   Lungs:Chest clear to auscultation; no wheezes, rhonchi,rales ,or rubs present.No increased work of breathing.  Abdomen: bowel sounds normal. Skin:no jaundice or tenting Lymphatic: No lymphadenopathy is noted about the head, neck, axilla          Assessment & Plan:  #1 atypical chest pain  #2 atypical back pain in midline T2-T12  #3 productive cough  #4 elevated C. reactive protein  #5 tachycardia  #6 PMH Lichen Planus  Plan: EKG will be repeated along with a troponin 1 & D dimer  If the studies are negative; I would recommend Dermatology consultation for punch biopsy of skin lesions for definitive diagnosis. I shall ask his Neurologist if Multiple Sclerosis should be considered in reference to his  "burning" pain in spine, LU chest & midline of spine.

## 2013-09-24 LAB — D-DIMER, QUANTITATIVE: D-Dimer, Quant: 0.4 ug/mL-FEU (ref 0.00–0.48)

## 2013-09-24 LAB — TROPONIN I: Troponin I: <0.01 ng/mL (ref <0.06)

## 2013-09-25 ENCOUNTER — Telehealth: Payer: Self-pay

## 2013-09-25 NOTE — Telephone Encounter (Signed)
09/23/13 office note and labs have been faxed to Dr Orbie Hurst and Dr Claudie Fisherman

## 2013-09-25 NOTE — Telephone Encounter (Signed)
Message copied by Shelly Coss on Fri Sep 25, 2013  8:06 AM ------      Message from: Hendricks Limes      Created: Fri Sep 25, 2013  6:01 AM       Please FAX office visit & lab results to Dr Orbie Hurst , Derm , [place]  AND Dr Claudie Fisherman, Neurologist @ [facility] ( ? [address]). Thanks, Hopp       ------

## 2013-11-20 ENCOUNTER — Encounter: Payer: PRIVATE HEALTH INSURANCE | Admitting: Internal Medicine

## 2014-01-20 ENCOUNTER — Ambulatory Visit (INDEPENDENT_AMBULATORY_CARE_PROVIDER_SITE_OTHER): Payer: PRIVATE HEALTH INSURANCE | Admitting: Internal Medicine

## 2014-01-20 ENCOUNTER — Other Ambulatory Visit (INDEPENDENT_AMBULATORY_CARE_PROVIDER_SITE_OTHER): Payer: PRIVATE HEALTH INSURANCE

## 2014-01-20 ENCOUNTER — Encounter: Payer: Self-pay | Admitting: Internal Medicine

## 2014-01-20 VITALS — BP 118/86 | HR 73 | Temp 98.1°F | Resp 13 | Ht 73.0 in | Wt 285.2 lb

## 2014-01-20 DIAGNOSIS — Z0189 Encounter for other specified special examinations: Secondary | ICD-10-CM

## 2014-01-20 DIAGNOSIS — R202 Paresthesia of skin: Secondary | ICD-10-CM

## 2014-01-20 DIAGNOSIS — Z0000 Encounter for general adult medical examination without abnormal findings: Secondary | ICD-10-CM

## 2014-01-20 DIAGNOSIS — E669 Obesity, unspecified: Secondary | ICD-10-CM

## 2014-01-20 DIAGNOSIS — M79609 Pain in unspecified limb: Secondary | ICD-10-CM | POA: Insufficient documentation

## 2014-01-20 DIAGNOSIS — R7301 Impaired fasting glucose: Secondary | ICD-10-CM

## 2014-01-20 LAB — CBC WITH DIFFERENTIAL/PLATELET
BASOS PCT: 0.6 % (ref 0.0–3.0)
Basophils Absolute: 0 10*3/uL (ref 0.0–0.1)
EOS PCT: 0.7 % (ref 0.0–5.0)
Eosinophils Absolute: 0.1 10*3/uL (ref 0.0–0.7)
HEMATOCRIT: 46.6 % (ref 39.0–52.0)
Hemoglobin: 15.7 g/dL (ref 13.0–17.0)
Lymphocytes Relative: 29.4 % (ref 12.0–46.0)
Lymphs Abs: 2.3 10*3/uL (ref 0.7–4.0)
MCHC: 33.7 g/dL (ref 30.0–36.0)
MCV: 91.7 fl (ref 78.0–100.0)
MONOS PCT: 7.9 % (ref 3.0–12.0)
Monocytes Absolute: 0.6 10*3/uL (ref 0.1–1.0)
NEUTROS ABS: 4.9 10*3/uL (ref 1.4–7.7)
Neutrophils Relative %: 61.4 % (ref 43.0–77.0)
Platelets: 220 10*3/uL (ref 150.0–400.0)
RBC: 5.08 Mil/uL (ref 4.22–5.81)
RDW: 13.4 % (ref 11.5–15.5)
WBC: 7.9 10*3/uL (ref 4.0–10.5)

## 2014-01-20 LAB — BASIC METABOLIC PANEL
BUN: 16 mg/dL (ref 6–23)
CO2: 25 mEq/L (ref 19–32)
Calcium: 9.2 mg/dL (ref 8.4–10.5)
Chloride: 104 mEq/L (ref 96–112)
Creatinine, Ser: 0.9 mg/dL (ref 0.4–1.5)
GFR: 91.63 mL/min (ref >60.00)
GLUCOSE: 100 mg/dL — AB (ref 70–99)
Potassium: 4.6 mEq/L (ref 3.5–5.1)
SODIUM: 136 meq/L (ref 135–145)

## 2014-01-20 LAB — C-REACTIVE PROTEIN: CRP: <0.5 mg/dL (ref 0.5–20.0)

## 2014-01-20 LAB — HEPATIC FUNCTION PANEL
ALBUMIN: 4.5 g/dL (ref 3.5–5.2)
ALT: 33 U/L (ref 0–53)
AST: 20 U/L (ref 0–37)
Alkaline Phosphatase: 50 U/L (ref 39–117)
Bilirubin, Direct: 0.1 mg/dL (ref 0.0–0.3)
Total Bilirubin: 0.5 mg/dL (ref 0.2–1.2)
Total Protein: 7.5 g/dL (ref 6.0–8.3)

## 2014-01-20 LAB — LIPID PANEL
Cholesterol: 170 mg/dL (ref 0–200)
HDL: 39.1 mg/dL (ref >39.00)
LDL Cholesterol: 111 mg/dL — ABNORMAL HIGH (ref 0–99)
NONHDL: 130.9
Total CHOL/HDL Ratio: 4
Triglycerides: 100 mg/dL (ref 0.0–149.0)
VLDL: 20 mg/dL (ref 0.0–40.0)

## 2014-01-20 LAB — HEMOGLOBIN A1C: Hgb A1c MFr Bld: 5.8 % (ref 4.6–6.5)

## 2014-01-20 LAB — TSH: TSH: 1.48 u[IU]/mL (ref 0.35–4.50)

## 2014-01-20 MED ORDER — GABAPENTIN 100 MG PO CAPS: ORAL_CAPSULE | ORAL | Status: DC

## 2014-01-20 NOTE — Progress Notes (Signed)
   Subjective:    Patient ID: Russell Thomas, male    DOB: 10/20/1960, 53 y.o.   MRN: [id number]  HPI  He is here for a physical;acute issues include burning in L shoulder & back.  He is concerned about inability to lose weight. He is on no specific diet. He rarely exercises more than once a week as walking. He doesn't want to the gym.     Review of Systems   He has had the burning sensation in his left shoulder and left back for several years. There is always some constant sensation; but it varies in intensity. His Neurologist did a C-reactive protein and sedimentation rate. Sedimentation rate was normal C-reactive protein was elevated. This has not been treated to date.     Objective:   Physical Exam Gen.: Healthy and well-nourished in appearance. Alert, appropriate and cooperative throughout exam. As per CDC Guidelines ,Epic documents obesity as being present . Appears younger than stated age  Head: Normocephalic without obvious abnormalities; no alopecia  Eyes: No corneal or conjunctival inflammation noted. Pupils equal round reactive to light and accommodation. Extraocular motion intact.  Vision grossly normal with /w/o lenses Ears: External  ear exam reveals no significant lesions or deformities. Wax on L. R TM normal. Hearing is grossly normal bilaterally. Nose: External nasal exam reveals no deformity or inflammation. Nasal mucosa are pink and moist. No lesions or exudates noted.   Mouth: Oral mucosa and oropharynx reveal no lesions or exudates. Teeth in good repair. Neck: No deformities, masses, or tenderness noted. Range of motion & Thyroid normal. Lungs: Normal respiratory effort; chest expands symmetrically. Lungs are clear to auscultation without rales, wheezes, or increased work of breathing. Heart: Normal rate and rhythm. Normal S1 and S2. No gallop, click, or rub. No murmur. Abdomen:Protuberant. Bowel sounds normal; abdomen soft and nontender. No masses, organomegaly or  hernias noted. Genitalia: Genitalia normal except for left varices. Prostate is normal without enlargement, asymmetry, nodularity, or induration   Musculoskeletal/extremities: No deformity or scoliosis noted of  the thoracic or lumbar spine.  No clubbing, cyanosis, edema, or significant extremity  deformity noted.  Range of motion normal . Tone & strength normal. Hand joints normal.  Fingernail  health good. Crepitus of knees  Able to lie down & sit up w/o help.  Negative SLR bilaterally Vascular: Carotid, radial artery, dorsalis pedis and  posterior tibial pulses are full and equal. No bruits present. Neurologic: Alert and oriented x3. Deep tendon reflexes symmetrical and normal. Cranial nerve exam normal Gait normal     Skin: Intact without suspicious lesions or rashes. Lymph: No cervical, axillary, or inguinal lymphadenopathy present. Psych: Mood and affect are normal. Normally interactive                                                                                       Assessment & Plan:  #1 comprehensive physical exam; no acute findings #2 L shoulder & back paresthesias #3 obesity Plan: see Orders  & Recommendations

## 2014-01-20 NOTE — Patient Instructions (Signed)
The Nutrition referral will be scheduled and you'll be notified of the time.Please call the Referral Co-Ordinator @ [phone] if you have not been notified of appointment time within 7-10 days.  Your next office appointment will be determined based upon review of your pending labs . Those instructions will be transmitted to you through My Chart .

## 2014-01-20 NOTE — Progress Notes (Signed)
Pre visit review using our clinic review tool, if applicable. No additional management support is needed unless otherwise documented below in the visit note. 

## 2014-08-18 ENCOUNTER — Ambulatory Visit (INDEPENDENT_AMBULATORY_CARE_PROVIDER_SITE_OTHER): Payer: PRIVATE HEALTH INSURANCE | Admitting: Podiatry

## 2014-08-18 ENCOUNTER — Encounter: Payer: Self-pay | Admitting: Podiatry

## 2014-08-18 DIAGNOSIS — B351 Tinea unguium: Secondary | ICD-10-CM | POA: Diagnosis not present

## 2014-08-18 NOTE — Progress Notes (Signed)
Subjective:     Patient ID: Russell Thomas, male   DOB: 05-16-1960, 54 y.o.   MRN: [id number]  HPI HPIThis patient presents with concerns about his big toes both feet. He has reddened nail bed under right big toe.  No history of trauma.He has history of nail surgery as a teenager and now his big toenail left foot has  become thick disfigured and discolored.  There is no pain associated with left big toenail.  No history of drainage noted.    Review of Systems  HENT:       Ringing in ears   Skin:       Change in nails   Allergic/Immunologic: Positive for environmental allergies.  All other systems reviewed and are negative.      Objective:   Physical Exam Objective: Review of past medical history, medications, social history and allergies were performed.  Vascular: Dorsalis pedis and posterior tibial pulses were palpable B/L, capillary refill was  WNL B/L, temperature gradient was WNL B/L   Skin:  No signs of symptoms of infection or ulcers on both feet  Nails: subungual hematoma right hallux toenail.   Unattached onychomycotic nail left hallux.  Sensory: Thornell Mule monifilament WNL   Orthopedic: Orthopedic evaluation demonstrates all joints distal t ankle have full ROM without crepitus, muscle power WNL B/L.         Assessment & Plan:  Onychomycosis left hallux.  IE.  Discussed both great toenails with patient.  No treatment for right hallux.  Left hallux is unattached and fungal element present.  Since there is no pain, told hom to wait until symptoms occur.  Review of Systems     Objective:   Physical Exam     Assessment:         Plan:

## 2014-11-29 LAB — HEPATIC FUNCTION PANEL
ALT: 44 U/L — AB (ref 10–40)
AST: 19 U/L (ref 14–40)
Alkaline Phosphatase: 63 U/L (ref 25–125)

## 2014-11-29 LAB — BASIC METABOLIC PANEL
BUN: 18 mg/dL (ref 4–21)
Creatinine: 1 mg/dL (ref <=1.3)
Potassium: 4.2 mmol/L (ref 3.4–5.3)
SODIUM: 138 mmol/L (ref 137–147)

## 2014-12-03 ENCOUNTER — Encounter: Payer: PRIVATE HEALTH INSURANCE | Admitting: Internal Medicine

## 2014-12-08 LAB — TSH: TSH: 1.86 u[IU]/mL (ref <=5.90)

## 2014-12-08 LAB — LIPID PANEL
CHOLESTEROL: 183 mg/dL (ref 0–200)
HDL: 44 mg/dL (ref 35–70)
LDL CALC: 110 mg/dL
TRIGLYCERIDES: 144 mg/dL (ref 40–160)

## 2014-12-08 LAB — CBC AND DIFFERENTIAL
HCT: 44 % (ref 41–53)
HEMOGLOBIN: 15.5 g/dL (ref 13.5–17.5)
Platelets: 227 10*3/uL (ref 150–399)
WBC: 8.7 10*3/mL

## 2014-12-27 LAB — PSA: PSA: 4.69

## 2015-02-11 ENCOUNTER — Telehealth: Payer: Self-pay | Admitting: Internal Medicine

## 2015-02-11 NOTE — Telephone Encounter (Signed)
Pt is sch for dec 2017 and does not want to wait that long. Pt is on wait list. Pt lives in [place] decline to see a md at  [facility]. Can I create 30 min slot?

## 2015-02-11 NOTE — Telephone Encounter (Signed)
See below

## 2015-02-11 NOTE — Telephone Encounter (Signed)
Patient can either wait for my available, transfer to [facility] or transfer to one of our other providers who has available slots before that time.

## 2015-02-14 ENCOUNTER — Ambulatory Visit (INDEPENDENT_AMBULATORY_CARE_PROVIDER_SITE_OTHER): Payer: PRIVATE HEALTH INSURANCE | Admitting: Internal Medicine

## 2015-02-14 ENCOUNTER — Other Ambulatory Visit (INDEPENDENT_AMBULATORY_CARE_PROVIDER_SITE_OTHER): Payer: PRIVATE HEALTH INSURANCE

## 2015-02-14 ENCOUNTER — Encounter: Payer: Self-pay | Admitting: Internal Medicine

## 2015-02-14 VITALS — BP 110/70 | HR 84 | Temp 98.7°F | Resp 16 | Ht 73.0 in | Wt 280.0 lb

## 2015-02-14 DIAGNOSIS — R590 Localized enlarged lymph nodes: Secondary | ICD-10-CM | POA: Diagnosis not present

## 2015-02-14 LAB — MONONUCLEOSIS SCREEN: MONO SCREEN: NEGATIVE

## 2015-02-14 LAB — CBC WITH DIFFERENTIAL/PLATELET
BASOS ABS: 0.1 10*3/uL (ref 0.0–0.1)
Basophils Relative: 1.5 % (ref 0.0–3.0)
EOS ABS: 0.1 10*3/uL (ref 0.0–0.7)
Eosinophils Relative: 0.9 % (ref 0.0–5.0)
HEMATOCRIT: 45.9 % (ref 39.0–52.0)
Hemoglobin: 15.6 g/dL (ref 13.0–17.0)
LYMPHS ABS: 2.4 10*3/uL (ref 0.7–4.0)
LYMPHS PCT: 24.8 % (ref 12.0–46.0)
MCHC: 33.9 g/dL (ref 30.0–36.0)
MCV: 91.4 fl (ref 78.0–100.0)
MONOS PCT: 6.1 % (ref 3.0–12.0)
Monocytes Absolute: 0.6 10*3/uL (ref 0.1–1.0)
NEUTROS ABS: 6.5 10*3/uL (ref 1.4–7.7)
NEUTROS PCT: 66.7 % (ref 43.0–77.0)
PLATELETS: 219 10*3/uL (ref 150.0–400.0)
RBC: 5.02 Mil/uL (ref 4.22–5.81)
RDW: 13.1 % (ref 11.5–15.5)
WBC: 9.7 10*3/uL (ref 4.0–10.5)

## 2015-02-14 LAB — COMPREHENSIVE METABOLIC PANEL
ALT: 28 U/L (ref 0–53)
AST: 19 U/L (ref 0–37)
Albumin: 4.6 g/dL (ref 3.5–5.2)
Alkaline Phosphatase: 50 U/L (ref 39–117)
BUN: 19 mg/dL (ref 6–23)
CHLORIDE: 102 meq/L (ref 96–112)
CO2: 29 mEq/L (ref 19–32)
CREATININE: 1.04 mg/dL (ref 0.40–1.50)
Calcium: 9.6 mg/dL (ref 8.4–10.5)
GFR: 78.25 mL/min (ref >60.00)
GLUCOSE: 91 mg/dL (ref 70–99)
POTASSIUM: 4 meq/L (ref 3.5–5.1)
SODIUM: 139 meq/L (ref 135–145)
Total Bilirubin: 0.4 mg/dL (ref 0.2–1.2)
Total Protein: 7 g/dL (ref 6.0–8.3)

## 2015-02-14 LAB — SEDIMENTATION RATE: Sed Rate: 15 mm/hr (ref 0–22)

## 2015-02-14 NOTE — Progress Notes (Signed)
Pre visit review using our clinic review tool, if applicable. No additional management support is needed unless otherwise documented below in the visit note. 

## 2015-02-14 NOTE — Patient Instructions (Signed)

## 2015-02-14 NOTE — Progress Notes (Signed)
Subjective:  Patient ID: Russell Thomas, male    DOB: 1960-08-31  Age: 55 y.o. MRN: [id number]  CC: Lymphadenopathy and Rash  New to me  HPI Russell Thomas presents for the complaint of painful, swollen lymph nodes on the right upper side of his neck for about 3-4 weeks. He also complains of persistent laryngitis and right-sided ear pain. He tells me that about 4 weeks ago he saw a dermatologist for rash on his thighs. He was told that it was an infection and he is just now completing a 2 week course of doxycycline. The rash on the inner thighs is much better but he is still concerned about having red spots in the area.  Outpatient Prescriptions Prior to Visit  Medication Sig Dispense Refill  . Loratadine 10 MG CAPS Take by mouth. As needed    . LORazepam (ATIVAN) 0.5 MG tablet TAKE 1 TABLET BY MOUTH EVERY 8 HOURS AS NEEDED    . gabapentin (NEURONTIN) 100 MG capsule One pill every eight hours as needed; dose may be increased by one pill each dose after 72 hours if only partially effective 30 capsule 2  . hydrOXYzine (ATARAX/VISTARIL) 10 MG tablet Take 1 tablet (10 mg total) by mouth 3 (three) times daily as needed. 30 tablet 0  . montelukast (SINGULAIR) 10 MG tablet Take 1 tablet (10 mg total) by mouth at bedtime. 30 tablet 3  . triamcinolone (NASACORT) 55 MCG/ACT nasal inhaler Place 2 sprays into the nose daily.     No facility-administered medications prior to visit.    ROS Review of Systems  Constitutional: Negative.  Negative for fever, chills, diaphoresis, activity change, appetite change, fatigue and unexpected weight change.  HENT: Positive for voice change. Negative for congestion, facial swelling, nosebleeds, postnasal drip, rhinorrhea, sinus pressure, sore throat, tinnitus and trouble swallowing.   Eyes: Negative.  Negative for visual disturbance.  Respiratory: Negative.  Negative for cough, choking, chest tightness, shortness of breath and stridor.   Cardiovascular:  Negative.  Negative for chest pain, palpitations and leg swelling.  Gastrointestinal: Negative.  Negative for nausea, vomiting, abdominal pain, diarrhea, constipation and blood in stool.  Endocrine: Negative.   Genitourinary: Negative.  Negative for dysuria and testicular pain.  Musculoskeletal: Negative.  Negative for myalgias, back pain, arthralgias and neck pain.  Skin: Positive for rash. Negative for color change and pallor.  Allergic/Immunologic: Negative.   Neurological: Negative.   Hematological: Positive for adenopathy. Does not bruise/bleed easily.  Psychiatric/Behavioral: Negative.     Objective:  BP 110/70 mmHg  Pulse 84  Temp(Src) 98.7 F (37.1 C) (Oral)  Resp 16  Ht 6\' 1"  (1.854 m)  Wt 280 lb (127.007 kg)  BMI 36.95 kg/m2  SpO2 97%  BP Readings from Last 3 Encounters:  02/14/15 110/70  01/20/14 118/86  09/23/13 134/84    Wt Readings from Last 3 Encounters:  02/14/15 280 lb (127.007 kg)  01/20/14 285 lb 4 oz (129.389 kg)  09/23/13 284 lb 8 oz (129.048 kg)    Physical Exam  Constitutional: He is oriented to person, place, and time. He appears well-developed and well-nourished. No distress.  HENT:  Right Ear: Hearing, tympanic membrane, external ear and ear canal normal.  Left Ear: Hearing, tympanic membrane, external ear and ear canal normal.  Nose: No mucosal edema or rhinorrhea. Right sinus exhibits no maxillary sinus tenderness and no frontal sinus tenderness. Left sinus exhibits no maxillary sinus tenderness and no frontal sinus tenderness.  Mouth/Throat: Uvula is midline,  oropharynx is clear and moist and mucous membranes are normal. Mucous membranes are not pale, not dry and not cyanotic. No oral lesions. No trismus in the jaw. No uvula swelling. No oropharyngeal exudate, posterior oropharyngeal edema, posterior oropharyngeal erythema or tonsillar abscesses.  Eyes: Conjunctivae are normal. Right eye exhibits no discharge. Left eye exhibits no discharge. No  scleral icterus.  Neck: Normal range of motion. Neck supple. No JVD present. No tracheal deviation present. No thyromegaly present.  Cardiovascular: Normal rate, regular rhythm, normal heart sounds and intact distal pulses.  Exam reveals no gallop and no friction rub.   No murmur heard. Pulmonary/Chest: Effort normal and breath sounds normal. No stridor. No respiratory distress. He has no wheezes. He has no rales. He exhibits no tenderness.  Abdominal: Soft. Bowel sounds are normal. He exhibits no distension and no mass. There is no tenderness. There is no rebound and no guarding.  Musculoskeletal: Normal range of motion. He exhibits no edema or tenderness.  Lymphadenopathy:       Head (right side): Submandibular adenopathy present. No submental, no tonsillar, no preauricular, no posterior auricular and no occipital adenopathy present.       Head (left side): No submental, no submandibular, no tonsillar, no preauricular, no posterior auricular and no occipital adenopathy present.    He has cervical adenopathy.       Right cervical: Deep cervical adenopathy present. No posterior cervical adenopathy present.      Left cervical: No superficial cervical, no deep cervical and no posterior cervical adenopathy present.    He has axillary adenopathy.       Right axillary: Lateral adenopathy present. No pectoral adenopathy present.       Left axillary: No pectoral and no lateral adenopathy present.      Right: No inguinal, no supraclavicular and no epitrochlear adenopathy present.       Left: No inguinal, no supraclavicular and no epitrochlear adenopathy present.  Over the right upper aspect of the neck over the deep cervical and submandibular regions there is a group of palpable lymph nodes that measure a couple centimeters each. They're mildly tender. There is no fluctuance, the lymph nodes are not fixed, and there is no induration. There are overlying skin is normal.  In the right axilla there is one  palpable lymph node, it measures about 2 cm, it is freely mobile, nontender and non-fixed. The overlying skin is normal.  Neurological: He is oriented to person, place, and time.  Skin: Skin is warm, dry and intact. Rash noted. No bruising, no ecchymosis, no lesion, no petechiae and no purpura noted. Rash is macular. Rash is not papular, not maculopapular, not nodular, not pustular, not vesicular and not urticarial. He is not diaphoretic. No erythema. No pallor.     Psychiatric: He has a normal mood and affect. His behavior is normal. Judgment and thought content normal.  Vitals reviewed.   Lab Results  Component Value Date   WBC 9.7 02/14/2015   HGB 15.6 02/14/2015   HCT 45.9 02/14/2015   PLT 219.0 02/14/2015   GLUCOSE 91 02/14/2015   CHOL 170 01/20/2014   TRIG 100.0 01/20/2014   HDL 39.10 01/20/2014   LDLCALC 111* 01/20/2014   ALT 28 02/14/2015   AST 19 02/14/2015   NA 139 02/14/2015   K 4.0 02/14/2015   CL 102 02/14/2015   CREATININE 1.04 02/14/2015   BUN 19 02/14/2015   CO2 29 02/14/2015   TSH 1.48 01/20/2014   PSA 2.74 06/13/2009  HGBA1C 5.8 01/20/2014    Dg Chest 2 View  09/23/2013  CLINICAL DATA:  Cough EXAM: CHEST  2 VIEW COMPARISON:  None. FINDINGS: The heart size and mediastinal contours are within normal limits. Both lungs are clear. The visualized skeletal structures are unremarkable. IMPRESSION: No active cardiopulmonary disease. Electronically Signed   By: Maryclare Bean M.D.   On: 09/23/2013 17:17    Assessment & Plan:   Skai was seen today for lymphadenopathy and rash.  Diagnoses and all orders for this visit:  Lymphadenopathy, cervical- he has lymphadenopathy but no systemic symptoms. He does complain of persistent laryngitis. I will get a CT scan of the soft tissues of the neck to see if there is any concern for a malignant or infectious process. Will also screen his lab work to see if there is inflammation, HIV infection, a recent mono infection,  lymphoproliferative disease, anemia, leukocytosis. -     CBC with Differential/Platelet; Future -     SPEP & IFE with QIG; Future -     Sedimentation rate; Future -     HIV antibody; Future -     Mononucleosis screen; Future -     CT Soft Tissue Neck W Contrast; Future -     Comprehensive metabolic panel; Future  I have discontinued Mr. Eastland's triamcinolone, montelukast, hydrOXYzine, and gabapentin. I am also having him maintain his Loratadine, LORazepam, mupirocin ointment, and DOXYCYCLINE PO.  Meds ordered this encounter  Medications  . mupirocin ointment (BACTROBAN) 2 %    Sig: Place 1 application into the nose 2 (two) times daily.  Marland Kitchen DOXYCYCLINE PO    Sig: Take 100 mg by mouth daily.      Follow-up: Return in about 3 weeks (around 03/07/2015).  [name], MD

## 2015-02-15 LAB — HIV ANTIBODY (ROUTINE TESTING W REFLEX): HIV: NONREACTIVE

## 2015-02-15 NOTE — Telephone Encounter (Signed)
lmom for pt to call back

## 2015-02-16 ENCOUNTER — Encounter: Payer: Self-pay | Admitting: Internal Medicine

## 2015-02-16 NOTE — Telephone Encounter (Signed)
lmom for pt to call back

## 2015-02-16 NOTE — Telephone Encounter (Signed)
Spoke with pt and advised him of Dr Yong Channel decision. He said ok

## 2015-02-17 ENCOUNTER — Telehealth: Payer: Self-pay | Admitting: Internal Medicine

## 2015-02-17 ENCOUNTER — Encounter: Payer: Self-pay | Admitting: Internal Medicine

## 2015-02-17 LAB — PROTEIN,TOTAL AND ELECTROPHOR W/IFE
ALBUMIN ELP: 4.5 g/dL (ref 3.8–4.8)
Alpha-1-Globulin: 0.5 g/dL — ABNORMAL HIGH (ref 0.2–0.3)
Alpha-2-Globulin: 0.6 g/dL (ref 0.5–0.9)
Beta 2: 0.3 g/dL (ref 0.2–0.5)
Beta Globulin: 0.5 g/dL (ref 0.4–0.6)
Gamma Globulin: 0.8 g/dL (ref 0.8–1.7)
TOTAL PROTEIN, SERUM ELECTROPHOR: 7.2 g/dL (ref 6.1–8.1)

## 2015-02-17 NOTE — Telephone Encounter (Signed)
yes

## 2015-02-17 NOTE — Telephone Encounter (Signed)
[name] is requesting to be taken under your care as an xfer from dr hopper. You have already seen him recently. Is this something that you'd like to do? If so, would he need an appt to formally establish?

## 2015-02-21 NOTE — Telephone Encounter (Signed)
Called and scheduled

## 2015-02-25 ENCOUNTER — Telehealth: Payer: Self-pay | Admitting: Internal Medicine

## 2015-02-25 NOTE — Telephone Encounter (Signed)
Received call from patient regarding serum protein electrophoresis lab results. States that he has not heard back and he is worried. i called the lab and asked that they contact [facility] for an update. They provided the results. i faxed the results to the patients home fax per his request and placed a copy on dr Adah Salvage desk for interpretation

## 2015-02-28 ENCOUNTER — Other Ambulatory Visit: Payer: PRIVATE HEALTH INSURANCE

## 2015-03-03 ENCOUNTER — Other Ambulatory Visit: Payer: PRIVATE HEALTH INSURANCE

## 2015-03-09 ENCOUNTER — Encounter: Payer: Self-pay | Admitting: Internal Medicine

## 2015-03-21 ENCOUNTER — Ambulatory Visit (INDEPENDENT_AMBULATORY_CARE_PROVIDER_SITE_OTHER): Payer: PRIVATE HEALTH INSURANCE | Admitting: Internal Medicine

## 2015-03-21 ENCOUNTER — Encounter: Payer: Self-pay | Admitting: Internal Medicine

## 2015-03-21 VITALS — BP 110/80 | HR 72 | Temp 99.1°F | Resp 16 | Ht 73.0 in | Wt 288.0 lb

## 2015-03-21 DIAGNOSIS — D892 Hypergammaglobulinemia, unspecified: Secondary | ICD-10-CM | POA: Insufficient documentation

## 2015-03-21 NOTE — Patient Instructions (Signed)

## 2015-03-21 NOTE — Progress Notes (Signed)
Subjective:  Patient ID: Russell Thomas, male    DOB: 1960-04-23  Age: 55 y.o. MRN: [id number]  CC: Lymphadenopathy   HPI Russell Thomas presents for follow-up on lymphadenopathy. He tells me that the painful swollen lymph nodes in his neck has resolved. He's decided not to do the CT scan. The only abnormal lab work was that his serum protein electrophoresis was mildly abnormal and a restricted band could not be excluded. It was recommended to repeat the test in 6-8 months.  Outpatient Prescriptions Prior to Visit  Medication Sig Dispense Refill  . Loratadine 10 MG CAPS Take by mouth. As needed    . LORazepam (ATIVAN) 0.5 MG tablet TAKE 1 TABLET BY MOUTH EVERY 8 HOURS AS NEEDED    . DOXYCYCLINE PO Take 100 mg by mouth daily.     . mupirocin ointment (BACTROBAN) 2 % Place 1 application into the nose 2 (two) times daily.     No facility-administered medications prior to visit.    ROS Review of Systems  Constitutional: Negative.  Negative for fever, chills, diaphoresis, appetite change and fatigue.  HENT: Negative.   Eyes: Negative.   Respiratory: Negative.  Negative for cough, choking, chest tightness, shortness of breath and stridor.   Cardiovascular: Negative.  Negative for chest pain, palpitations and leg swelling.  Gastrointestinal: Negative.  Negative for nausea, vomiting, abdominal pain, diarrhea, constipation and blood in stool.  Endocrine: Negative.   Genitourinary: Negative.   Musculoskeletal: Negative.   Skin: Negative.  Negative for color change, pallor and rash.  Neurological: Negative.   Hematological: Negative.  Negative for adenopathy. Does not bruise/bleed easily.  Psychiatric/Behavioral: Negative.     Objective:  BP 110/80 mmHg  Pulse 72  Temp(Src) 99.1 F (37.3 C) (Oral)  Resp 16  Ht 6\' 1"  (1.854 m)  Wt 288 lb (130.636 kg)  BMI 38.01 kg/m2  SpO2 97%  BP Readings from Last 3 Encounters:  03/21/15 110/80  02/14/15 110/70  01/20/14 118/86    Wt  Readings from Last 3 Encounters:  03/21/15 288 lb (130.636 kg)  02/14/15 280 lb (127.007 kg)  01/20/14 285 lb 4 oz (129.389 kg)    Physical Exam  Constitutional: He is oriented to person, place, and time. No distress.  HENT:  Mouth/Throat: Oropharynx is clear and moist. No oropharyngeal exudate.  Eyes: Conjunctivae are normal. Right eye exhibits no discharge. Left eye exhibits no discharge. No scleral icterus.  Neck: Normal range of motion. Neck supple. No JVD present. No tracheal deviation present. No thyromegaly present.  Cardiovascular: Normal rate, regular rhythm, normal heart sounds and intact distal pulses.  Exam reveals no gallop and no friction rub.   No murmur heard. Pulmonary/Chest: Effort normal and breath sounds normal. No stridor. No respiratory distress. He has no wheezes. He has no rales. He exhibits no tenderness.  Abdominal: Soft. Bowel sounds are normal. He exhibits no distension and no mass. There is no tenderness. There is no rebound and no guarding.  Musculoskeletal: Normal range of motion. He exhibits no edema or tenderness.  Lymphadenopathy:       Head (right side): No submental, no submandibular, no tonsillar, no preauricular, no posterior auricular and no occipital adenopathy present.       Head (left side): No submental, no submandibular, no tonsillar, no preauricular, no posterior auricular and no occipital adenopathy present.    He has no cervical adenopathy.       Right cervical: No superficial cervical, no deep cervical and no  posterior cervical adenopathy present.      Left cervical: No superficial cervical, no deep cervical and no posterior cervical adenopathy present.    He has axillary adenopathy.       Right axillary: Lateral adenopathy present.       Left axillary: No pectoral and no lateral adenopathy present.      Right: No inguinal, no supraclavicular and no epitrochlear adenopathy present.       Left: No inguinal, no supraclavicular and no  epitrochlear adenopathy present.  In the right axilla there is a 2 cm node that is non-tender and non-fixed, the overlying skin is normal  Neurological: He is oriented to person, place, and time.  Skin: Skin is warm and dry. No rash noted. He is not diaphoretic. No erythema. No pallor.  Vitals reviewed.   Lab Results  Component Value Date   WBC 9.7 02/14/2015   HGB 15.6 02/14/2015   HCT 45.9 02/14/2015   PLT 219.0 02/14/2015   GLUCOSE 91 02/14/2015   CHOL 170 01/20/2014   TRIG 100.0 01/20/2014   HDL 39.10 01/20/2014   LDLCALC 111* 01/20/2014   ALT 28 02/14/2015   AST 19 02/14/2015   NA 139 02/14/2015   K 4.0 02/14/2015   CL 102 02/14/2015   CREATININE 1.04 02/14/2015   BUN 19 02/14/2015   CO2 29 02/14/2015   TSH 1.48 01/20/2014   PSA 2.74 06/13/2009   HGBA1C 5.8 01/20/2014    Dg Chest 2 View  09/23/2013  CLINICAL DATA:  Cough EXAM: CHEST  2 VIEW COMPARISON:  None. FINDINGS: The heart size and mediastinal contours are within normal limits. Both lungs are clear. The visualized skeletal structures are unremarkable. IMPRESSION: No active cardiopulmonary disease. Electronically Signed   By: Maryclare Bean M.D.   On: 09/23/2013 17:17    Assessment & Plan:   [name] was seen today for lymphadenopathy.  Diagnoses and all orders for this visit:  Paraproteinemia - no further testing is needed at this time, he is not interested in doing the CT scan and the lymphadenopathy has resolved, the lymph node in the right axilla feels benign to me, he will let me know if this becomes larger or painful. I Will recheck his serum protein electrophoresis in about 4-6 months.  Other orders -     LORazepam (ATIVAN) 0.5 MG tablet; Take 1 tablet (0.5 mg total) by mouth every 8 (eight) hours as needed for anxiety. TAKE 1 TABLET BY MOUTH EVERY 8 HOURS AS NEEDED   I have discontinued [name]'s mupirocin ointment and DOXYCYCLINE PO. I am also having him maintain his Loratadine and LORazepam.  No orders  of the defined types were placed in this encounter.     Follow-up: Return in about 6 months (around 09/18/2015).  [name], MD

## 2015-03-21 NOTE — Progress Notes (Signed)
Pre visit review using our clinic review tool, if applicable. No additional management support is needed unless otherwise documented below in the visit note. 

## 2015-04-20 ENCOUNTER — Telehealth: Payer: Self-pay | Admitting: Family Medicine

## 2015-04-20 NOTE — Telephone Encounter (Signed)
Received  Records from [place], sent 22 pages to Dr. Garret Reddish  04/20/15 fbg,

## 2015-04-22 ENCOUNTER — Ambulatory Visit (INDEPENDENT_AMBULATORY_CARE_PROVIDER_SITE_OTHER): Payer: PRIVATE HEALTH INSURANCE | Admitting: Family Medicine

## 2015-04-22 ENCOUNTER — Encounter: Payer: Self-pay | Admitting: Family Medicine

## 2015-04-22 VITALS — BP 119/82 | HR 89 | Temp 98.8°F | Resp 20 | Wt 285.8 lb

## 2015-04-22 DIAGNOSIS — Z7189 Other specified counseling: Secondary | ICD-10-CM | POA: Diagnosis not present

## 2015-04-22 DIAGNOSIS — J302 Other seasonal allergic rhinitis: Secondary | ICD-10-CM

## 2015-04-22 DIAGNOSIS — J0100 Acute maxillary sinusitis, unspecified: Secondary | ICD-10-CM | POA: Insufficient documentation

## 2015-04-22 DIAGNOSIS — J309 Allergic rhinitis, unspecified: Secondary | ICD-10-CM

## 2015-04-22 DIAGNOSIS — J3089 Other allergic rhinitis: Secondary | ICD-10-CM

## 2015-04-22 DIAGNOSIS — Z7689 Persons encountering health services in other specified circumstances: Secondary | ICD-10-CM

## 2015-04-22 MED ORDER — MONTELUKAST SODIUM 10 MG PO TABS: 10.0000 mg | ORAL_TABLET | Freq: Every day | ORAL | Status: DC

## 2015-04-22 MED ORDER — AZELASTINE HCL 0.1 % NA SOLN: 2.0000 | Freq: Two times a day (BID) | NASAL | Status: DC

## 2015-04-22 MED ORDER — PREDNISONE 20 MG PO TABS: ORAL_TABLET | ORAL | Status: DC

## 2015-04-22 MED ORDER — AZITHROMYCIN 250 MG PO TABS: ORAL_TABLET | ORAL | Status: DC

## 2015-04-22 NOTE — Patient Instructions (Signed)
pred burst , Nasal spray and singular called in.  Zpack print out if not improved by Sunday or develop fever or chills start  It was a pleasure to meet you.

## 2015-04-22 NOTE — Progress Notes (Signed)
Patient ID: Russell Thomas, male   DOB: [birth year], 55 y.o.   MRN: [id number]      Patient ID: Russell Thomas, male  DOB: [birth year], 55 y.o.   MRN: [id number]  Subjective:  Russell Thomas is a 55 y.o. male present for establish care, from another [place] provider,  with acute complaint.  All past medical history, surgical history, allergies, family history, immunizations, medications and social history were updated in the electronic medical record today. All recent labs, ED visits and hospitalizations within the last year were reviewed.  Rhinitis: Patient states he has had a long-standing history of allergic rhinitis/seasonal allergies that seem to worsen around this time a year. He knows he is allergic to many of the trees and he also suffers from hayfever allergies. Patient states at times he does get bronchial symptoms, but currently is not having bronchitis symptoms. He uses Flonase and Claritin daily. He had been prescribed Singulair in the past, does not currently have a prescription for this medication. He reports usually needing a prednisone pack once a year at least. He denies any fever, chills, nausea, vomiting or diarrhea. He does admit to a slight headache and sinus pressure.   Past Medical History  Diagnosis Date  . Sleep apnea     [facility]  . Hyperlipemia 2009  . Adenomatous colon polyp 2008    Dr Deatra Ina, GI  . Diverticulosis   . IBS (irritable bowel syndrome)   . Allergic rhinitis   . Internal hemorrhoids 2012    banded   . Gastritis   . Lichen planus    Allergies  Allergen Reactions  . Cephalosporins Rash    hives  . Ciprofloxacin Rash    Rash   . Contrast Media [Iodinated Diagnostic Agents] Hives, Itching and Rash    GADOLINIUM  . Omeprazole Rash    REACTION: rash  . Penicillins Rash and Itching    Weezing, blisters rash  . Shrimp [Shellfish Allergy] Rash    rash  . Sulfonamide Derivatives     Rash   . Sulfa Antibiotics Hives    Past Surgical History  Procedure Laterality Date  . Septoplasty    . Thumb surgery    . Tonsillectomy    . Colonoscopy  04/10/2010, 08/12/2006    3/12 with banding; 2008: adenomatous polyp, diverticulosis  . Upper gastrointestinal endoscopy  10/24/2001    gastritis  . Upper gastrointestinal endoscopy  11/03/12   Family History  Problem Relation Age of Onset  . Thyroid disease Mother     hypothyroidism  . Allergies Mother   . Parkinson's disease Mother     not definite  . Colitis Father   . Alcohol abuse Paternal Grandfather   . Diabetes Neg Hx   . Stroke Neg Hx   . Atrial fibrillation Mother    Social History   Social History  . Marital Status: Married    Spouse Name: N/A  . Number of Children: 1  . Years of Education: N/A   Occupational History  . Sales    Social History Main Topics  . Smoking status: Current Some Day Smoker    Types: Cigars  . Smokeless tobacco: Never Used     Comment:  RARE ; <1 -2 cigar / YEAR  . Alcohol Use: Yes     Comment: rare ; 0-1 drink / month  . Drug Use: No  . Sexual Activity: Not on file   Other Topics Concern  . Not on file  Social History Narrative   NO REG EXERCISE     ROS: Negative, with the exception of above mentioned in HPI  Objective: BP 119/82 mmHg  Pulse 89  Temp(Src) 98.8 F (37.1 C) (Oral)  Resp 20  Wt 285 lb 12 oz (129.615 kg)  SpO2 96% Gen: Afebrile. No acute distress. Nontoxic in appearance, well-developed, well-nourished, male,  HENT: AT. [place]. Bilateral TM visualized, air fluid levels/full present bilaterally, pink to make tympanic membrane bilaterally, no erythema or bulging. and normal in appearance, normal external auditory canal. MMM, no oral lesions Bilateral nares with erythema and bogginess. Throat without erythema, ulcerations or exudates. No Cough on exam, no hoarseness on exam. Tender to palpation maxillary sinus Eyes:Pupils Equal Round Reactive to light, Extraocular movements intact,  Conjunctiva  without redness, discharge or icterus. Neck/lymp/endocrine: Supple, shotty cervical anterior lymphadenopathy CV: RRR  Chest: CTAB, no wheeze, rhonchi or crackles. Abd: Soft. NTND. BS present Neuro/Msk: Normal gait. PERLA. EOMi. Alert. Oriented x3.   Psych: Normal affect, dress and demeanor. Normal speech. Normal thought content and judgment.   Assessment/plan: JEPHTE PUND is a 55 y.o. male present for  Establishment of care with acute Complaint 1. Allergic rhinitis, unspecified allergic rhinitis type - Discussed with patient chronic allergic rhinitis, especially this time a year he should restart his Singulair, and switch from Claritin to another antihistamine of choice. - Continue Flonase, added Astelin, if I itching symptoms do not resolve with this combination, with consider Pataday drops. Patient was encouraged to call in if needing eyedrops.  - montelukast (SINGULAIR) 10 MG tablet; Take 1 tablet (10 mg total) by mouth at bedtime.  Dispense: 30 tablet; Refill: 3 - azelastine (ASTELIN) 0.1 % nasal spray; Place 2 sprays into both nostrils 2 (two) times daily. Use in each nostril as directed  Dispense: 30 mL; Refill: 11  2. Acute maxillary sinusitis, recurrence not specified - Provided prescription for prednisone and Z-Pak, patient had signs and symptoms of maxillary sinusitis on exam today, encouraged him to start the above regimen for his allergic rhinitis, start prednisone. If he doesn't not see improvement by Sunday in his symptoms, or is worsening/fever, he is to start taking the Z-Pak. - predniSONE (DELTASONE) 20 MG tablet; 60 mg x2d, 40 mg x2d, 20 mg x1`d, 10 mg x2d  Dispense: 13 tablet; Refill: 0 - azithromycin (ZITHROMAX) 250 MG tablet; 500 mg day 1, then 250 ng QD  Dispense: 6 tablet; Refill: 0   No Follow-up on file.  Electronically signed by: Howard Pouch, DO [place]

## 2015-04-27 ENCOUNTER — Encounter: Payer: Self-pay | Admitting: Family Medicine

## 2015-05-24 ENCOUNTER — Encounter: Payer: Self-pay | Admitting: Family Medicine

## 2015-05-24 ENCOUNTER — Ambulatory Visit (INDEPENDENT_AMBULATORY_CARE_PROVIDER_SITE_OTHER): Payer: PRIVATE HEALTH INSURANCE | Admitting: Family Medicine

## 2015-05-24 DIAGNOSIS — J0100 Acute maxillary sinusitis, unspecified: Secondary | ICD-10-CM | POA: Diagnosis not present

## 2015-05-24 MED ORDER — DOXYCYCLINE HYCLATE 100 MG PO TABS: 100.0000 mg | ORAL_TABLET | Freq: Two times a day (BID) | ORAL | Status: DC

## 2015-05-24 NOTE — Patient Instructions (Addendum)
- doxycyline and allergy regimen.  - zantac 150 mg 2x day with nexium.   - GERD diet.   Gastroesophageal Reflux Disease, Adult Normally, food travels down the esophagus and stays in the stomach to be digested. However, when a person has gastroesophageal reflux disease (GERD), food and stomach acid move back up into the esophagus. When this happens, the esophagus becomes sore and inflamed. Over time, GERD can create small holes (ulcers) in the lining of the esophagus.  CAUSES This condition is caused by a problem with the muscle between the esophagus and the stomach (lower esophageal sphincter, or LES). Normally, the LES muscle closes after food passes through the esophagus to the stomach. When the LES is weakened or abnormal, it does not close properly, and that allows food and stomach acid to go back up into the esophagus. The LES can be weakened by certain dietary substances, medicines, and medical conditions, including:  Tobacco use.  Pregnancy.  Having a hiatal hernia.  Heavy alcohol use.  Certain foods and beverages, such as coffee, chocolate, onions, and peppermint. RISK FACTORS This condition is more likely to develop in:  People who have an increased body weight.  People who have connective tissue disorders.  People who use NSAID medicines. SYMPTOMS Symptoms of this condition include:  Heartburn.  Difficult or painful swallowing.  The feeling of having a lump in the throat.  Abitter taste in the mouth.  Bad breath.  Having a large amount of saliva.  Having an upset or bloated stomach.  Belching.  Chest pain.  Shortness of breath or wheezing.  Ongoing (chronic) cough or a night-time cough.  Wearing away of tooth enamel.  Weight loss. Different conditions can cause chest pain. Make sure to see your health care provider if you experience chest pain. DIAGNOSIS Your health care provider will take a medical history and perform a physical exam. To determine  if you have mild or severe GERD, your health care provider may also monitor how you respond to treatment. You may also have other tests, including:  An endoscopy toexamine your stomach and esophagus with a small camera.  A test thatmeasures the acidity level in your esophagus.  A test thatmeasures how much pressure is on your esophagus.  A barium swallow or modified barium swallow to show the shape, size, and functioning of your esophagus. TREATMENT The goal of treatment is to help relieve your symptoms and to prevent complications. Treatment for this condition may vary depending on how severe your symptoms are. Your health care provider may recommend:  Changes to your diet.  Medicine.  Surgery. HOME CARE INSTRUCTIONS Diet  Follow a diet as recommended by your health care provider. This may involve avoiding foods and drinks such as:  Coffee and tea (with or without caffeine).  Drinks that containalcohol.  Energy drinks and sports drinks.  Carbonated drinks or sodas.  Chocolate and cocoa.  Peppermint and mint flavorings.  Garlic and onions.  Horseradish.  Spicy and acidic foods, including peppers, chili powder, curry powder, vinegar, hot sauces, and barbecue sauce.  Citrus fruit juices and citrus fruits, such as oranges, lemons, and limes.  Tomato-based foods, such as red sauce, chili, salsa, and pizza with red sauce.  Fried and fatty foods, such as donuts, french fries, potato chips, and high-fat dressings.  High-fat meats, such as hot dogs and fatty cuts of red and white meats, such as rib eye steak, sausage, ham, and bacon.  High-fat dairy items, such as whole milk, butter,  and cream cheese.  Eat small, frequent meals instead of large meals.  Avoid drinking large amounts of liquid with your meals.  Avoid eating meals during the 2-3 hours before bedtime.  Avoid lying down right after you eat.  Do not exercise right after you eat. General  Instructions  Pay attention to any changes in your symptoms.  Take over-the-counter and prescription medicines only as told by your health care provider. Do not take aspirin, ibuprofen, or other NSAIDs unless your health care provider told you to do so.  Do not use any tobacco products, including cigarettes, chewing tobacco, and e-cigarettes. If you need help quitting, ask your health care provider.  Wear loose-fitting clothing. Do not wear anything tight around your waist that causes pressure on your abdomen.  Raise (elevate) the head of your bed 6 inches (15cm).  Try to reduce your stress, such as with yoga or meditation. If you need help reducing stress, ask your health care provider.  If you are overweight, reduce your weight to an amount that is healthy for you. Ask your health care provider for guidance about a safe weight loss goal.  Keep all follow-up visits as told by your health care provider. This is important. SEEK MEDICAL CARE IF:  You have new symptoms.  You have unexplained weight loss.  You have difficulty swallowing, or it hurts to swallow.  You have wheezing or a persistent cough.  Your symptoms do not improve with treatment.  You have a hoarse voice. SEEK IMMEDIATE MEDICAL CARE IF:  You have pain in your arms, neck, jaw, teeth, or back.  You feel sweaty, dizzy, or light-headed.  You have chest pain or shortness of breath.  You vomit and your vomit looks like blood or coffee grounds.  You faint.  Your stool is bloody or black.  You cannot swallow, drink, or eat.   This information is not intended to replace advice given to you by your health care provider. Make sure you discuss any questions you have with your health care provider.   Document Released: 11/01/2004 Document Revised: 10/13/2014 Document Reviewed: 05/19/2014 Elsevier Interactive Patient Education 2016 [place] for Gastroesophageal Reflux Disease, Adult When you  have gastroesophageal reflux disease (GERD), the foods you eat and your eating habits are very important. Choosing the right foods can help ease the discomfort of GERD. WHAT GENERAL GUIDELINES DO I NEED TO FOLLOW?  Choose fruits, vegetables, whole grains, low-fat dairy products, and low-fat meat, fish, and poultry.  Limit fats such as oils, salad dressings, butter, nuts, and avocado.  Keep a food diary to identify foods that cause symptoms.  Avoid foods that cause reflux. These may be different for different people.  Eat frequent small meals instead of three large meals each day.  Eat your meals slowly, in a relaxed setting.  Limit fried foods.  Cook foods using methods other than frying.  Avoid drinking alcohol.  Avoid drinking large amounts of liquids with your meals.  Avoid bending over or lying down until 2-3 hours after eating. WHAT FOODS ARE NOT RECOMMENDED? The following are some foods and drinks that may worsen your symptoms: Vegetables Tomatoes. Tomato juice. Tomato and spaghetti sauce. Chili peppers. Onion and garlic. Horseradish. Fruits Oranges, grapefruit, and lemon (fruit and juice). Meats High-fat meats, fish, and poultry. This includes hot dogs, ribs, ham, sausage, salami, and bacon. Dairy Whole milk and chocolate milk. Sour cream. Cream. Butter. Ice cream. Cream cheese.  Beverages Coffee and tea,  with or without caffeine. Carbonated beverages or energy drinks. Condiments Hot sauce. Barbecue sauce.  Sweets/Desserts Chocolate and cocoa. Donuts. Peppermint and spearmint. Fats and Oils High-fat foods, including [place] fries and potato chips. Other Vinegar. Strong spices, such as black pepper, white pepper, red pepper, cayenne, curry powder, cloves, ginger, and chili powder. The items listed above may not be a complete list of foods and beverages to avoid. Contact your dietitian for more information.   This information is not intended to replace advice given  to you by your health care provider. Make sure you discuss any questions you have with your health care provider.   Document Released: 01/22/2005 Document Revised: 02/12/2014 Document Reviewed: 11/26/2012 Elsevier Interactive Patient Education [employer].

## 2015-05-24 NOTE — Progress Notes (Signed)
Patient ID: STEEN KICK, male   DOB: August 24, 1960, 55 y.o.   MRN: [id number]      Patient ID: ANDREL BONNET, male  DOB: Jun 26, 1960, 55 y.o.   MRN: [id number]  Subjective:  Russell Thomas is a 55 y.o. male present for acute visit for recurrent sinus issues. Sinusitis: Patient was seen approximately one month ago for similar symptoms. He was treated with prednisone and azithromycin, he states that he was better for short period of time, and that his symptoms quickly returned. He states the cough was the first to return, and is wondering if it was GERD related. He started a PPI 2 days ago. He reports he also start Mucinex 2 days ago. He has continued the Astelin and Singulair. He change from Claritin to Zyrtec over this time. He admits that he ran out of the [facility] for little over a week, but restarted that yesterday. He states the cough returned and it can be quite deep and he has increased phlegm production. He doesn't at its worse in the morning. He is having hoarseness and loss of voice. Feels he had a low-grade fever, headache, facial pressure and achiness. He denies nausea, vomit, diarrhea or chills.   Past Medical History  Diagnosis Date  . Sleep apnea     [facility]  . Hyperlipemia 2009  . Adenomatous colon polyp 2008    Dr Deatra Ina, GI  . Diverticulosis   . IBS (irritable bowel syndrome)   . Allergic rhinitis   . Internal hemorrhoids 2012    banded   . Gastritis   . Lichen planus    Allergies  Allergen Reactions  . Cephalosporins Rash    hives  . Ciprofloxacin Rash    Rash   . Contrast Media [Iodinated Diagnostic Agents] Hives, Itching and Rash    GADOLINIUM  . Omeprazole Rash    REACTION: rash  . Penicillins Rash and Itching    Weezing, blisters rash  . Shrimp [Shellfish Allergy] Rash    rash  . Sulfonamide Derivatives     Rash   . Sulfa Antibiotics Hives   Past Surgical History  Procedure Laterality Date  . Septoplasty    . Thumb surgery     . Tonsillectomy    . Colonoscopy  04/10/2010, 08/12/2006    3/12 with banding; 2008: adenomatous polyp, diverticulosis  . Upper gastrointestinal endoscopy  10/24/2001    gastritis  . Upper gastrointestinal endoscopy  11/03/12   Family History  Problem Relation Age of Onset  . Thyroid disease Mother     hypothyroidism  . Allergies Mother   . Parkinson's disease Mother     not definite  . Colitis Father   . Alcohol abuse Paternal Grandfather   . Diabetes Neg Hx   . Stroke Neg Hx   . Atrial fibrillation Mother    Social History   Social History  . Marital Status: Married    Spouse Name: N/A  . Number of Children: 1  . Years of Education: N/A   Occupational History  . Sales    Social History Main Topics  . Smoking status: Current Some Day Smoker    Types: Cigars  . Smokeless tobacco: Never Used     Comment:  RARE ; <1 -2 cigar / YEAR  . Alcohol Use: Yes     Comment: rare ; 0-1 drink / month  . Drug Use: No  . Sexual Activity: Yes   Other Topics Concern  . Not on  file   Social History Narrative   Married.     ROS: Negative, with the exception of above mentioned in HPI  Objective: BP 124/87 mmHg  Pulse 91  Temp(Src) 98.4 F (36.9 C) (Oral)  Resp 20  Wt 284 lb 4 oz (128.935 kg)  SpO2 96% Gen: Afebrile. No acute distress. Nontoxic in appearance, well-developed, well-nourished, male,  HENT: AT. [place]. Bilateral TM visualized, air fluid levels/full present bilaterally, pink  tympanic membrane bilaterally, no erythema or bulging. normal external auditory canal. MMM, no oral lesions Bilateral nares with erythema and bogginess. Throat without erythema, ulcerations or exudates. Cough on exam, hoarseness on exam. Tender to palpation maxillary sinus Eyes:Pupils Equal Round Reactive to light, Extraocular movements intact,  Conjunctiva without redness, discharge or icterus. Neck/lymp/endocrine: Supple, shotty cervical anterior lymphadenopathy CV: RRR  Chest: CTAB, no wheeze,  rhonchi or crackles. Abd: Soft. NTND. BS present Neuro/Msk: Normal gait. PERLA. EOMi. Alert. Oriented x3.   Psych: Normal affect, dress and demeanor. Normal speech. Normal thought content and judgment.   Assessment/plan: QUINDON GECKLER is a 55 y.o. male present for  Establishment of care with acute Complaint Acute maxillary sinusitis, recurrence not specified - Provided prescription for doxycycline today.  - Rest hydrate, continue allergy regimen. Plus minus the PPI and Zantac, follow GERD diet in the event the chronic cough is from GERD related symptoms. If not completely resolved within 1-2 weeks would want to obtain a chest x-ray. Follow-up one week if not improvement  Electronically signed by: Howard Pouch, DO [place]

## 2015-05-25 ENCOUNTER — Institutional Professional Consult (permissible substitution): Payer: PRIVATE HEALTH INSURANCE | Admitting: Internal Medicine

## 2015-05-27 ENCOUNTER — Encounter: Payer: Self-pay | Admitting: Family Medicine

## 2015-06-06 ENCOUNTER — Telehealth: Payer: Self-pay | Admitting: Family Medicine

## 2015-06-06 NOTE — Telephone Encounter (Signed)
Patient asked to make an appointment with Dr. Anitra Lauth this week to check his glucose & A1C. Patient states he already had his physical somewhere else. Patient states he has not developed any symptoms of diabetes but just wants to "keep an eye on it". How should this be scheduled?

## 2015-06-06 NOTE — Telephone Encounter (Signed)
Dr. Raoul Pitch pt. Please advise. Thanks.

## 2015-06-06 NOTE — Telephone Encounter (Signed)
Patient will need to schedule with Dr Raoul Pitch since this is not an acute visit. Schedule as a office visit  Notes follow up on previous elevated HgbA1C.

## 2015-06-06 NOTE — Telephone Encounter (Signed)
Patient has to check his schedule for next week. He will CB to schedule.

## 2015-06-10 ENCOUNTER — Ambulatory Visit (INDEPENDENT_AMBULATORY_CARE_PROVIDER_SITE_OTHER): Payer: PRIVATE HEALTH INSURANCE | Admitting: Pulmonary Disease

## 2015-06-10 ENCOUNTER — Encounter: Payer: Self-pay | Admitting: Pulmonary Disease

## 2015-06-10 ENCOUNTER — Ambulatory Visit (INDEPENDENT_AMBULATORY_CARE_PROVIDER_SITE_OTHER)
Admission: RE | Admit: 2015-06-10 | Discharge: 2015-06-10 | Disposition: A | Discharge status: Home / Self Care | Payer: PRIVATE HEALTH INSURANCE | Source: Ambulatory Visit | Attending: Pulmonary Disease | Admitting: Pulmonary Disease

## 2015-06-10 VITALS — BP 132/80 | HR 82 | Ht 73.0 in | Wt 288.0 lb

## 2015-06-10 DIAGNOSIS — R0600 Dyspnea, unspecified: Secondary | ICD-10-CM

## 2015-06-10 NOTE — Progress Notes (Signed)
Subjective:    Patient ID: Russell Thomas, male    DOB: [birth year], 55 y.o.   MRN: [id number]  HPI Consult for evaluation of bronchitis, cough.  Russell Thomas is a 55 year old with past medical history of seasonal allergies, obstructive sleep apnea, sinusitis.  He had an episode of acute sinusitis about 8 weeks ago which proceeded to chest congestion.  He had cough with white mucus production, dyspnea, wheezing. He was treated with a Z-Pak and prednisone which did not help symptoms. He was then given a course of doxycycline which he is in the process of completing. He states that his symptoms are finally improving although they persist to some extent. He is treating his allergies with Flonase, Zyrtec. He is also on Nexium for GERD.  He has history of severe perennial and seasonal allergies, sensitivity to pollen. He was seen by Dr. Annamaria Boots in the past. He also has sleep apnea on CPAP 12 cm. He follows up with with a speech specialist outside of our practice. He is apparently compliant with his CPAP with no issues.  Social history: Occasional cigar use, no alcohol, drug use.  Family history: Mother-allergies, atrial fibrillation, Parkinson's, thyroid disease. Father-colitis  Past Medical History  Diagnosis Date  . Sleep apnea     [facility]  . Hyperlipemia 2009  . Adenomatous colon polyp 2008    Dr Deatra Ina, GI  . Diverticulosis   . IBS (irritable bowel syndrome)   . Allergic rhinitis   . Internal hemorrhoids 2012    banded   . Gastritis   . Lichen planus     Current outpatient prescriptions:  .  azelastine (ASTELIN) 0.1 % nasal spray, Place 2 sprays into both nostrils 2 (two) times daily. Use in each nostril as directed, Disp: 30 mL, Rfl: 11 .  cetirizine (ZYRTEC) 10 MG tablet, Take 10 mg by mouth daily., Disp: , Rfl:  .  Dextromethorphan-Guaifenesin (MUCINEX DM PO), Take 1 tablet by mouth every 12 (twelve) hours as needed. , Disp: , Rfl:  .  esomeprazole  (NEXIUM) 20 MG capsule, Take 20 mg by mouth daily at 12 noon., Disp: , Rfl:  .  fluticasone (FLONASE) 50 MCG/ACT nasal spray, Place into both nostrils daily., Disp: , Rfl:  .  montelukast (SINGULAIR) 10 MG tablet, Take 1 tablet (10 mg total) by mouth at bedtime., Disp: 30 tablet, Rfl: 3 .  LORazepam (ATIVAN) 0.5 MG tablet, Reported on 06/10/2015, Disp: , Rfl:    Review of Systems Cough, sputum production, dyspnea, wheezing. No chest pain, palpitation. No nausea, vomiting, diarrhea, constipation. No fevers, chills, loss of weight, loss of appetite.    Objective:   Physical Exam Blood pressure 132/80, pulse 82, height 6\' 1"  (1.854 m), weight 288 lb (130.636 kg), SpO2 99 %. Gen: No apparent distress Neuro: No gross focal deficits. HEENT: No JVD, lymphadenopathy, thyromegaly. RS: Clear, No wheeze or crackles CVS: S1-S2 heard, no murmurs rubs gallops. Abdomen: Soft, positive bowel sounds. Musculoskeletal: No edema.    Assessment & Plan:  Chronic cough Allergic rhinitis Acute sinusitis, bronchitis.  Russell Thomas has upper airway cough syndrome from his allergic rhinitis, postnasal drip. He may have a component of GERD and is being treated with a PPI empirically. He's had a recent episode of sinusitis which worsened his cough, dyspnea. I wonder if he is developing reactive airway disease, asthma. I will evaluate this by getting a chest x-ray and pulmonary function tests with a bronchodilator response. We discussed treating him empirically with  inhalers for the lung but decided to await the results of the PFTs and reassess in a month before starting him on any therapy.  He is compliant with the CPAP for OSA with no issues.  Plan: - CXR, PFTs  Return in 1 month.  Marshell Garfinkel MD [facility] Pager [phone] If no answer or after 3pm call: [phone] 06/10/2015, 10:09 AM

## 2015-06-10 NOTE — Patient Instructions (Signed)
We will schedule you for a chest x-ray and pulmonary function test.  Please complete your doxycycline as prescribed. Please call us back if your symptoms do not improve or with any questions.  I'll see you in clinic in 1 month for review of results and decide on further treatment with inhalers if needed.

## 2015-07-15 ENCOUNTER — Ambulatory Visit: Payer: PRIVATE HEALTH INSURANCE | Admitting: Pulmonary Disease

## 2015-07-15 ENCOUNTER — Ambulatory Visit (HOSPITAL_COMMUNITY): Payer: PRIVATE HEALTH INSURANCE

## 2015-08-08 ENCOUNTER — Encounter: Payer: Self-pay | Admitting: Internal Medicine

## 2015-08-08 ENCOUNTER — Other Ambulatory Visit: Payer: Self-pay | Admitting: Internal Medicine

## 2015-08-08 DIAGNOSIS — D892 Hypergammaglobulinemia, unspecified: Secondary | ICD-10-CM

## 2015-08-19 ENCOUNTER — Other Ambulatory Visit (INDEPENDENT_AMBULATORY_CARE_PROVIDER_SITE_OTHER): Payer: PRIVATE HEALTH INSURANCE

## 2015-08-19 DIAGNOSIS — D892 Hypergammaglobulinemia, unspecified: Secondary | ICD-10-CM | POA: Diagnosis not present

## 2015-08-19 LAB — COMPREHENSIVE METABOLIC PANEL
ALK PHOS: 47 U/L (ref 39–117)
ALT: 25 U/L (ref 0–53)
AST: 18 U/L (ref 0–37)
Albumin: 4.3 g/dL (ref 3.5–5.2)
BUN: 17 mg/dL (ref 6–23)
CALCIUM: 9.5 mg/dL (ref 8.4–10.5)
CO2: 28 meq/L (ref 19–32)
Chloride: 105 mEq/L (ref 96–112)
Creatinine, Ser: 1.01 mg/dL (ref 0.40–1.50)
GFR: 80.79 mL/min (ref >60.00)
GLUCOSE: 103 mg/dL — AB (ref 70–99)
POTASSIUM: 4.7 meq/L (ref 3.5–5.1)
Sodium: 140 mEq/L (ref 135–145)
Total Bilirubin: 0.5 mg/dL (ref 0.2–1.2)
Total Protein: 6.9 g/dL (ref 6.0–8.3)

## 2015-08-19 LAB — CBC WITH DIFFERENTIAL/PLATELET
BASOS ABS: 0 10*3/uL (ref 0.0–0.1)
Basophils Relative: 0.6 % (ref 0.0–3.0)
EOS ABS: 0.1 10*3/uL (ref 0.0–0.7)
Eosinophils Relative: 1 % (ref 0.0–5.0)
HCT: 42.9 % (ref 39.0–52.0)
Hemoglobin: 14.6 g/dL (ref 13.0–17.0)
LYMPHS ABS: 2.4 10*3/uL (ref 0.7–4.0)
Lymphocytes Relative: 30.2 % (ref 12.0–46.0)
MCHC: 34.1 g/dL (ref 30.0–36.0)
MCV: 90.7 fl (ref 78.0–100.0)
MONO ABS: 0.7 10*3/uL (ref 0.1–1.0)
Monocytes Relative: 8.7 % (ref 3.0–12.0)
NEUTROS ABS: 4.7 10*3/uL (ref 1.4–7.7)
NEUTROS PCT: 59.5 % (ref 43.0–77.0)
PLATELETS: 192 10*3/uL (ref 150.0–400.0)
RBC: 4.73 Mil/uL (ref 4.22–5.81)
RDW: 13.5 % (ref 11.5–15.5)
WBC: 8 10*3/uL (ref 4.0–10.5)

## 2015-08-23 ENCOUNTER — Encounter: Payer: Self-pay | Admitting: Internal Medicine

## 2015-08-23 LAB — PROTEIN ELECTROPHORESIS, SERUM
ALPHA-2-GLOBULIN: 0.5 g/dL (ref 0.5–0.9)
Albumin ELP: 3.7 g/dL — ABNORMAL LOW (ref 3.8–4.8)
Alpha-1-Globulin: 0.2 g/dL (ref 0.2–0.3)
BETA GLOBULIN: 0.4 g/dL (ref 0.4–0.6)
Beta 2: 0.3 g/dL (ref 0.2–0.5)
GAMMA GLOBULIN: 0.7 g/dL — AB (ref 0.8–1.7)
Total Protein, Serum Electrophoresis: 5.9 g/dL — ABNORMAL LOW (ref 6.1–8.1)

## 2015-08-31 ENCOUNTER — Other Ambulatory Visit: Payer: Self-pay | Admitting: Family Medicine

## 2015-08-31 ENCOUNTER — Ambulatory Visit (INDEPENDENT_AMBULATORY_CARE_PROVIDER_SITE_OTHER): Payer: PRIVATE HEALTH INSURANCE | Admitting: Family Medicine

## 2015-08-31 ENCOUNTER — Encounter: Payer: Self-pay | Admitting: Family Medicine

## 2015-08-31 VITALS — BP 129/76 | HR 98 | Temp 98.7°F | Resp 20 | Ht 73.0 in | Wt 281.8 lb

## 2015-08-31 DIAGNOSIS — R7301 Impaired fasting glucose: Secondary | ICD-10-CM | POA: Diagnosis not present

## 2015-08-31 DIAGNOSIS — D892 Hypergammaglobulinemia, unspecified: Secondary | ICD-10-CM

## 2015-08-31 DIAGNOSIS — R5383 Other fatigue: Secondary | ICD-10-CM | POA: Diagnosis not present

## 2015-08-31 LAB — BASIC METABOLIC PANEL WITH GFR
BUN: 19 mg/dL (ref 7–25)
CALCIUM: 9.4 mg/dL (ref 8.6–10.3)
CO2: 26 mmol/L (ref 20–31)
CREATININE: 1.06 mg/dL (ref 0.70–1.33)
Chloride: 103 mmol/L (ref 98–110)
GFR, Est Non African American: 78 mL/min (ref >=60)
GLUCOSE: 82 mg/dL (ref 65–99)
Potassium: 4.5 mmol/L (ref 3.5–5.3)
Sodium: 140 mmol/L (ref 135–146)

## 2015-08-31 NOTE — Patient Instructions (Signed)
Protein Blood Test WHY AM I HAVING THIS TEST? Protein tests are done as a part of most routine screening tests. The tests screen for liver disorders, kidney disease, and many other conditions.  WHAT KIND OF SAMPLE IS TAKEN? A blood sample is required for this test. It is usually collected by inserting a needle into a vein. HOW DO I PREPARE FOR THIS TEST? There is no preparation required for this test. WHAT ARE THE REFERENCE RANGES? Reference rangesare considered healthy rangesestablished after testing a large group of healthy people. Reference ranges may vary among different people, labs, and hospitals. It is your responsibility to obtain your test results. Ask the lab or department performing the test when and how you will get your results. The reference ranges are as follows: Adult and Elderly  Total Protein: 6.4-8.3 g/dL or 64-83 g/L (SI units).  Albumin: 3.5-5 g/dL or 35-50 g/L (SI units).  Globulin: 2.3-3.4 g/dL.  Alpha1 globulin: 0.1-3 g/dL or 1-3 g/L (SI units).  Alpha2 globulin: 0.6-1 g/dL or 6-10 g/L (SI units).  Beta globulin: 0.7-1.1 g/dL or 7-11 g/L (SI units). Children  Total protein:  Premature infant: 4.2-7.6 g/dL.  Newborn: 4.6-7.4 g/dL.  Infant: 6-6.7 g/dL.  Child: 6.2-8 g/dL.  Albumin:  Premature infant: 3-4.2 g/dL.  Newborn: 3.5-5.4 g/dL.  Infant: 4.4-5.4 g/dL.  Child: 4-5.9 g/dL. WHAT DO THE RESULTS MEAN? Increased protein levels may indicate:  Dehydration.  High cholesterol.  Iron-deficiency anemia.  Estrogen therapy.  Malignancy.  Chronic inflammatory disease. Decreased protein levels may indicate:  Malnutrition.  Pregnancy.  Liver Disease.  Nephrotic syndrome.  Fluid imbalance.  Acute inflammation. Talk with your health care provider to discuss your results, treatment options, and if necessary, the need for more tests. Talk with your health care provider if you have any questions about your results.   This information  is not intended to replace advice given to you by your health care provider. Make sure you discuss any questions you have with your health care provider.   Document Released: 02/14/2004 Document Revised: 02/12/2014 Document Reviewed: 06/26/2013 Elsevier Interactive Patient Education [employer].  I will call you with all results once available. Depending on results, will decide if we need to investigate further and decide on follow up.

## 2015-08-31 NOTE — Progress Notes (Signed)
Russell Thomas , 1960-07-23, 55 y.o., male MRN: [id number] Patient Care Team    Relationship Specialty Notifications Start End  Ma Hillock, DO PCP - General Family Medicine  04/22/15   Marshell Garfinkel, MD Consulting Physician Pulmonary Disease  08/31/15     CC:  Subjective: Pt presents for an acute OV with complaints concerning lab results he received from prior provider. He had follow up SPEP and CMP labs last week for a compliant that originated in January with sore throat and lymphadenitis. He reports those symptoms have resolved. He endorses an occasional right cervical lymph node swelling with acute illness, that resolves with illness. Patient has been working out at [employer] and has lost 6 lbs in 2 months. He has not drastically changed his diet. He denies fever, chills, night sweats, dysuria, difficulty with urination, hematuria, low back discomfort. He endorses "frothy" urine and fatigue. Last PSA 12/2014 ~4.6. He reports all prostate examinations in the past have been normal. He states since he started working out, he has noticed he is more fatigued than usual after work outs.  He has had a CT with [facility] system on 2015, normal with exception of small GB stones.    He does smoke an occasional cigar.   05/28/2013 CT ABD: INDICATION: Right upper quadrant abdominal pain  TECHNIQUE: Unenhanced CT of the abdomen and pelvis  COMPARISON: None  FINDINGS: The lung bases are clear. No pleural or pericardial effusions. A few small calcified gallstones. No evidence of acute cholecystitis. No biliary dilatation. The liver, spleen, pancreas, and adrenal glands are unremarkable. No renal masses or  calcifications. No ureteral stones or hydronephrosis. Normal urinary bladder. No diverticulitis or colitis. Normal appendix. Small fat-containing umbilical hernia without bowel obstruction. No ascites or pneumoperitoneum. No adenopathy. Degenerative  changes in the spine without fractures or bone  destruction. No acute fractures or bone destruction  Recent Results (from the past 2160 hour(s))  Comprehensive metabolic panel     Status: Abnormal   Collection Time: 08/19/15 10:30 AM  Result Value Ref Range   Sodium 140 135 - 145 mEq/L   Potassium 4.7 3.5 - 5.1 mEq/L   Chloride 105 96 - 112 mEq/L   CO2 28 19 - 32 mEq/L   Glucose, Bld 103 (H) 70 - 99 mg/dL   BUN 17 6 - 23 mg/dL   Creatinine, Ser 1.01 0.40 - 1.50 mg/dL   Total Bilirubin 0.5 0.2 - 1.2 mg/dL   Alkaline Phosphatase 47 39 - 117 U/L   AST 18 0 - 37 U/L   ALT 25 0 - 53 U/L   Total Protein 6.9 6.0 - 8.3 g/dL   Albumin 4.3 3.5 - 5.2 g/dL   Calcium 9.5 8.4 - 10.5 mg/dL   GFR 80.79 >60.00 mL/min  CBC with Differential/Platelet     Status: None   Collection Time: 08/19/15 10:30 AM  Result Value Ref Range   WBC 8.0 4.0 - 10.5 K/uL   RBC 4.73 4.22 - 5.81 Mil/uL   Hemoglobin 14.6 13.0 - 17.0 g/dL   HCT 42.9 39.0 - 52.0 %   MCV 90.7 78.0 - 100.0 fl   MCHC 34.1 30.0 - 36.0 g/dL   RDW 13.5 11.5 - 15.5 %   Platelets 192.0 150.0 - 400.0 K/uL   Neutrophils Relative % 59.5 43.0 - 77.0 %   Lymphocytes Relative 30.2 12.0 - 46.0 %   Monocytes Relative 8.7 3.0 - 12.0 %   Eosinophils Relative 1.0 0.0 -  5.0 %   Basophils Relative 0.6 0.0 - 3.0 %   Neutro Abs 4.7 1.4 - 7.7 K/uL   Lymphs Abs 2.4 0.7 - 4.0 K/uL   Monocytes Absolute 0.7 0.1 - 1.0 K/uL   Eosinophils Absolute 0.1 0.0 - 0.7 K/uL   Basophils Absolute 0.0 0.0 - 0.1 K/uL  Protein electrophoresis, serum     Status: Abnormal   Collection Time: 08/19/15 10:30 AM  Result Value Ref Range   Total Protein, Serum Electrophoresis 5.9 (L) 6.1 - 8.1 g/dL   Albumin ELP 3.7 (L) 3.8 - 4.8 g/dL   Alpha-1-Globulin 0.2 0.2 - 0.3 g/dL   Alpha-2-Globulin 0.5 0.5 - 0.9 g/dL   Beta Globulin 0.4 0.4 - 0.6 g/dL   Beta 2 0.3 0.2 - 0.5 g/dL   Gamma Globulin 0.7 (L) 0.8 - 1.7 g/dL   Abnormal Protein Band1 NOT DET g/dL   SPE Interp. SEE NOTE     Comment: Potential early nephrotic pattern.  Consider urine protein electrophoresis to confirm. Reviewed by Odis Hollingshead, MD, PhD, FCAP (Electronic Signature on File)    Abnormal Protein Band2 NOT DET g/dL   Abnormal Protein Band3 NOT DET g/dL     Allergies  Allergen Reactions  . Cephalosporins Rash    hives  . Ciprofloxacin Rash    Rash   . Contrast Media [Iodinated Diagnostic Agents] Hives, Itching and Rash    GADOLINIUM  . Omeprazole Rash    REACTION: rash  . Penicillins Rash and Itching    Weezing, blisters rash  . Shrimp [Shellfish Allergy] Rash    rash  . Sulfonamide Derivatives     Rash    Social History  Substance Use Topics  . Smoking status: Former Smoker    Types: Cigars  . Smokeless tobacco: Never Used     Comment:  RARE ; <1 -2 cigar / YEAR  . Alcohol use Yes     Comment: rare ; 0-1 drink / month   Past Medical History:  Diagnosis Date  . Adenomatous colon polyp 2008   Dr Deatra Ina, GI  . Allergic rhinitis   . Diverticulosis   . Gastritis   . Hyperlipemia 2009  . IBS (irritable bowel syndrome)   . Internal hemorrhoids 2012   banded   . Lichen planus   . Sleep apnea    [facility]   Past Surgical History:  Procedure Laterality Date  . COLONOSCOPY  04/10/2010, 08/12/2006   3/12 with banding; 2008: adenomatous polyp, diverticulosis  . SEPTOPLASTY    . thumb surgery    . TONSILLECTOMY    . UPPER GASTROINTESTINAL ENDOSCOPY  10/24/2001   gastritis  . UPPER GASTROINTESTINAL ENDOSCOPY  11/03/12   Family History  Problem Relation Age of Onset  . Thyroid disease Mother     hypothyroidism  . Allergies Mother   . Parkinson's disease Mother     not definite  . Atrial fibrillation Mother   . Colitis Father   . Alcohol abuse Paternal Grandfather   . Diabetes Neg Hx   . Stroke Neg Hx      Medication List       Accurate as of 08/31/15  2:55 PM. Always use your most recent med list.          azelastine 0.1 % nasal spray Commonly known as:  ASTELIN Place 2 sprays  into both nostrils 2 (two) times daily. Use in each nostril as directed   cetirizine 10 MG tablet Commonly known as:  ZYRTEC Take 10 mg by mouth daily.   fluticasone 50 MCG/ACT nasal spray Commonly known as:  FLONASE Place into both nostrils daily.   LORazepam 0.5 MG tablet Commonly known as:  ATIVAN Reported on 06/10/2015   montelukast 10 MG tablet Commonly known as:  SINGULAIR Take 1 tablet (10 mg total) by mouth at bedtime.       No results found for this or any previous visit (from the past 24 hour(s)). No results found.   ROS: Negative, with the exception of above mentioned in HPI   Objective:  BP 129/76 (BP Location: Left Arm, Patient Position: Sitting, Cuff Size: Large)   Pulse 98   Temp 98.7 F (37.1 C) (Oral)   Resp 20   Ht 6' 1" (1.854 m)   Wt 281 lb 12.8 oz (127.8 kg)   SpO2 97%   BMI 37.18 kg/m  Body mass index is 37.18 kg/m. Gen: Afebrile. No acute distress. Nontoxic in appearance, well developed, well nourished. Obese. Pleasant caucasian male.  HENT: AT. [place].  MMM, no oral lesions. Eyes:Pupils Equal Round Reactive to light, Extraocular movements intact,  Conjunctiva without redness, discharge or icterus. Neck/lymp/endocrine: Supple,no  Lymphadenopathy, no thyromegaly CV: RRR , no edema Chest: CTAB, no wheeze or crackles. Good air movement, normal resp effort.  Abd: Soft. NTND. BS present. no Masses palpated. No HSM MSK: no obvious deformities. No edema. No CVA tenderness, no spinal tenderness.  Skin: Mild hyperpigmented rash bilateral dorsal hands.  Neuro: Normal gait. PERLA. EOMi. Alert. Oriented x3  Psych: Normal affect, dress and demeanor. Normal speech. Normal thought content and judgment.  Assessment/Plan: ARDA KEADLE is a 55 y.o. male present for acute OV for  Paraproteinemia/fatigue/elevated a1c: - no systemic signs of nephrotic syndrome/kidney disease or liver disease. Pt endorses mild fatigue, questionable rash of hands and "frothy"  urine. Discussed starting with labs below, and consider further investigation if abnormal. Creatinine and LFT normal, no edema. PSA 4.6. - consider inflammatory labs (esr, crp), ana, C3/4 and total complement, serum free light chains, UPEP with immunofixation, RPR, HEP panel, cryoglobulins if indicated - CBC w/Diff - BASIC METABOLIC PANEL WITH GFR - Urine Microalbumin w/creat. ratio - Urinalysis, Routine w reflex microscopic - TSH - HgB A1c   > 25 minutes spent with patient, >50% of time spent face to face counseling patient and coordinating care.   electronically signed by:  Howard Pouch, DO  [place]

## 2015-09-01 LAB — URINALYSIS, ROUTINE W REFLEX MICROSCOPIC
BILIRUBIN URINE: NEGATIVE
Glucose, UA: NEGATIVE
Hgb urine dipstick: NEGATIVE
Ketones, ur: NEGATIVE
LEUKOCYTES UA: NEGATIVE
Nitrite: NEGATIVE
PROTEIN: NEGATIVE
SPECIFIC GRAVITY, URINE: 1.024 (ref 1.001–1.035)
pH: 5.5 (ref 5.0–8.0)

## 2015-09-01 LAB — CBC WITH DIFFERENTIAL/PLATELET
BASOS ABS: 0 {cells}/uL (ref 0–200)
Basophils Relative: 0 %
EOS ABS: 84 {cells}/uL (ref 15–500)
Eosinophils Relative: 1 %
HEMATOCRIT: 46.3 % (ref 38.5–50.0)
Hemoglobin: 15.8 g/dL (ref 13.2–17.1)
LYMPHS PCT: 29 %
Lymphs Abs: 2436 cells/uL (ref 850–3900)
MCH: 31.1 pg (ref 27.0–33.0)
MCHC: 34.1 g/dL (ref 32.0–36.0)
MCV: 91.1 fL (ref 80.0–100.0)
MONO ABS: 756 {cells}/uL (ref 200–950)
MONOS PCT: 9 %
MPV: 10.8 fL (ref 7.5–12.5)
NEUTROS ABS: 5124 {cells}/uL (ref 1500–7800)
Neutrophils Relative %: 61 %
PLATELETS: 238 10*3/uL (ref 140–400)
RBC: 5.08 MIL/uL (ref 4.20–5.80)
RDW: 13.8 % (ref 11.0–15.0)
WBC: 8.4 10*3/uL (ref 3.8–10.8)

## 2015-09-01 LAB — MICROALBUMIN / CREATININE URINE RATIO
Creatinine, Urine: 191 mg/dL (ref 20–370)
MICROALB/CREAT RATIO: 3 ug/mg{creat} (ref <30)
Microalb, Ur: 0.6 mg/dL

## 2015-09-01 LAB — HEMOGLOBIN A1C
HEMOGLOBIN A1C: 5.5 % (ref <5.7)
MEAN PLASMA GLUCOSE: 111 mg/dL

## 2015-09-01 LAB — TSH: TSH: 1.04 m[IU]/L (ref 0.40–4.50)

## 2015-09-02 ENCOUNTER — Telehealth: Payer: Self-pay | Admitting: Family Medicine

## 2015-09-02 DIAGNOSIS — D892 Hypergammaglobulinemia, unspecified: Secondary | ICD-10-CM

## 2015-09-02 NOTE — Telephone Encounter (Signed)
Please call pt: - all his labs are normal. He has no protein in his urine. Kidneys functioning normal. Thyroid is also normal.  - Would like him to have inflammatory labs collected by lab appt only this coming week. We will call him after those labs received and decide if we need to investigate further.

## 2015-09-02 NOTE — Telephone Encounter (Signed)
Left message on patient with detailed results and instruction. Patient to call to schedule lab appt for next week.

## 2015-09-07 ENCOUNTER — Other Ambulatory Visit (INDEPENDENT_AMBULATORY_CARE_PROVIDER_SITE_OTHER): Payer: PRIVATE HEALTH INSURANCE

## 2015-09-07 DIAGNOSIS — D892 Hypergammaglobulinemia, unspecified: Secondary | ICD-10-CM

## 2015-09-07 LAB — C-REACTIVE PROTEIN: CRP: 0.9 mg/dL (ref 0.5–20.0)

## 2015-09-07 LAB — SEDIMENTATION RATE: Sed Rate: 3 mm/hr (ref 0–20)

## 2015-09-09 ENCOUNTER — Telehealth: Payer: Self-pay | Admitting: Family Medicine

## 2015-09-09 DIAGNOSIS — D892 Hypergammaglobulinemia, unspecified: Secondary | ICD-10-CM

## 2015-09-09 NOTE — Telephone Encounter (Signed)
Left message with lab results and instuctions for patient to pick up collection container for 24 hour urine next week.

## 2015-09-09 NOTE — Telephone Encounter (Signed)
Please call pt: - his inflammatory markers are fine.  - the last possible lab to complete is the 24 urine. I have placed this order, he will need to pick up supplies and instructed on collection/drop off.  - please have complete collection and drop off at his convenience. - I am out of town end of next week until 21st. I will get back to him on my return on results.

## 2015-09-15 NOTE — Addendum Note (Signed)
Addended by: Ralph Dowdy on: 09/15/2015 09:25 AM   Modules accepted: Orders

## 2015-09-17 LAB — UPEP/UIFE/LIGHT CHAINS/TP, 24-HR UR
% BETA, Urine: 0 %
ALBUMIN, U: 100 %
ALPHA 1 URINE: 0 %
ALPHA-2-GLOBULIN, U: 0 %
FREE LAMBDA LT CHAINS, UR: 0.42 mg/L (ref 0.24–6.66)
Free Kappa Lt Chains,Ur: 8.01 mg/L (ref 1.35–24.19)
GAMMA GLOBULIN URINE: 0 %
KAPPA/LAMBDA RATIO, U: 19.07 — AB (ref 2.04–10.37)
PDF ELP UR: 0
Protein, 24H Urine: 153 mg/24 hr — ABNORMAL HIGH (ref 30–150)
Protein, Ur: 5.1 mg/dL

## 2015-09-27 ENCOUNTER — Telehealth: Payer: Self-pay | Admitting: Family Medicine

## 2015-09-27 DIAGNOSIS — R809 Proteinuria, unspecified: Secondary | ICD-10-CM | POA: Insufficient documentation

## 2015-09-27 DIAGNOSIS — D892 Hypergammaglobulinemia, unspecified: Secondary | ICD-10-CM

## 2015-09-27 NOTE — Telephone Encounter (Signed)
Please call pt: - his urine studies are not completely in the normal range. There are some changes that should be further investigated by a specialist. These changes can be normal variants, false positives etc, or signs of early disease. Considering he continues to have mild abnormalities in these studies I would suggest a specialist to evaluate. I have placed a referral to hem/onc to weigh in on recs and need of any further studies.

## 2015-09-27 NOTE — Telephone Encounter (Signed)
Patient received a call from a hematologist. Patient would like to know why he is seeing a hematologist?

## 2015-09-27 NOTE — Telephone Encounter (Signed)
Spoke with patient regarding lab results and referral . Patient states he has scheduled an appt with Dr Raoul Pitch for tomorrow to further review lab results. He has a scheduled appt with Hem/Onc 10/17/15.

## 2015-09-28 ENCOUNTER — Ambulatory Visit (INDEPENDENT_AMBULATORY_CARE_PROVIDER_SITE_OTHER): Payer: PRIVATE HEALTH INSURANCE | Admitting: Family Medicine

## 2015-09-28 ENCOUNTER — Encounter: Payer: Self-pay | Admitting: Family Medicine

## 2015-09-28 VITALS — BP 131/87 | HR 81 | Temp 98.5°F | Resp 20 | Ht 73.0 in | Wt 281.5 lb

## 2015-09-28 DIAGNOSIS — R769 Abnormal immunological finding in serum, unspecified: Secondary | ICD-10-CM

## 2015-09-28 DIAGNOSIS — D892 Hypergammaglobulinemia, unspecified: Secondary | ICD-10-CM | POA: Diagnosis not present

## 2015-09-28 DIAGNOSIS — R809 Proteinuria, unspecified: Secondary | ICD-10-CM | POA: Diagnosis not present

## 2015-09-28 DIAGNOSIS — R778 Other specified abnormalities of plasma proteins: Secondary | ICD-10-CM

## 2015-09-28 NOTE — Progress Notes (Signed)
Russell Thomas , [birth year], 55 y.o., male MRN: [id number] Patient Care Team    Relationship Specialty Notifications Start End  Ma Hillock, DO PCP - General Family Medicine  04/22/15   Marshell Garfinkel, MD Consulting Physician Pulmonary Disease  08/31/15   Inda Castle, MD Consulting Physician Gastroenterology  08/31/15     CC: labs Subjective:  Paraproteinemia: Patient following up on abnormal SPEP/UPEP. Discussed results in detail with patient and his wife today. Results are below. Patient states he has noticed an abnormal intermittent rash especially over his hands. He feels he is more fatigued after working out, that he feels is very off for him, but his wife feels that he is working out hard. He endorses intermittent foamy urine, urinary frequency and urinary urgency.  Prior note: Pt presents for an acute OV with complaints concerning lab results he received from prior provider. He had follow up SPEP and CMP labs last week for a compliant that originated in January with sore throat and lymphadenitis. He reports those symptoms have resolved. He endorses an occasional right cervical lymph node swelling with acute illness, that resolves with illness. Patient has been working out at [employer] and has lost 6 lbs in 2 months. He has not drastically changed his diet. He denies fever, chills, night sweats, dysuria, difficulty with urination, hematuria, low back discomfort. He endorses "frothy" urine and fatigue. Last PSA 12/2014 ~4.6. He reports all prostate examinations in the past have been normal. He states since he started working out, he has noticed he is more fatigued than usual after work outs.  He has had a CT with [facility] system on 2015, normal with exception of small GB stones.    He does smoke an occasional cigar.   05/28/2013 CT ABD: INDICATION: Right upper quadrant abdominal pain  TECHNIQUE: Unenhanced CT of the abdomen and pelvis  COMPARISON: None  FINDINGS: The lung bases are  clear. No pleural or pericardial effusions. A few small calcified gallstones. No evidence of acute cholecystitis. No biliary dilatation. The liver, spleen, pancreas, and adrenal glands are unremarkable. No renal masses or  calcifications. No ureteral stones or hydronephrosis. Normal urinary bladder. No diverticulitis or colitis. Normal appendix. Small fat-containing umbilical hernia without bowel obstruction. No ascites or pneumoperitoneum. No adenopathy. Degenerative  changes in the spine without fractures or bone destruction. No acute fractures or bone destruction  Recent Results (from the past 2160 hour(s))  Comprehensive metabolic panel     Status: Abnormal   Collection Time: 08/19/15 10:30 AM  Result Value Ref Range   Sodium 140 135 - 145 mEq/L   Potassium 4.7 3.5 - 5.1 mEq/L   Chloride 105 96 - 112 mEq/L   CO2 28 19 - 32 mEq/L   Glucose, Bld 103 (H) 70 - 99 mg/dL   BUN 17 6 - 23 mg/dL   Creatinine, Ser 1.01 0.40 - 1.50 mg/dL   Total Bilirubin 0.5 0.2 - 1.2 mg/dL   Alkaline Phosphatase 47 39 - 117 U/L   AST 18 0 - 37 U/L   ALT 25 0 - 53 U/L   Total Protein 6.9 6.0 - 8.3 g/dL   Albumin 4.3 3.5 - 5.2 g/dL   Calcium 9.5 8.4 - 10.5 mg/dL   GFR 80.79 >60.00 mL/min  CBC with Differential/Platelet     Status: None   Collection Time: 08/19/15 10:30 AM  Result Value Ref Range   WBC 8.0 4.0 - 10.5 K/uL   RBC 4.73 4.22 -  5.81 Mil/uL   Hemoglobin 14.6 13.0 - 17.0 g/dL   HCT 42.9 39.0 - 52.0 %   MCV 90.7 78.0 - 100.0 fl   MCHC 34.1 30.0 - 36.0 g/dL   RDW 13.5 11.5 - 15.5 %   Platelets 192.0 150.0 - 400.0 K/uL   Neutrophils Relative % 59.5 43.0 - 77.0 %   Lymphocytes Relative 30.2 12.0 - 46.0 %   Monocytes Relative 8.7 3.0 - 12.0 %   Eosinophils Relative 1.0 0.0 - 5.0 %   Basophils Relative 0.6 0.0 - 3.0 %   Neutro Abs 4.7 1.4 - 7.7 K/uL   Lymphs Abs 2.4 0.7 - 4.0 K/uL   Monocytes Absolute 0.7 0.1 - 1.0 K/uL   Eosinophils Absolute 0.1 0.0 - 0.7 K/uL   Basophils Absolute 0.0 0.0 -  0.1 K/uL  Protein electrophoresis, serum     Status: Abnormal   Collection Time: 08/19/15 10:30 AM  Result Value Ref Range   Total Protein, Serum Electrophoresis 5.9 (L) 6.1 - 8.1 g/dL   Albumin ELP 3.7 (L) 3.8 - 4.8 g/dL   Alpha-1-Globulin 0.2 0.2 - 0.3 g/dL   Alpha-2-Globulin 0.5 0.5 - 0.9 g/dL   Beta Globulin 0.4 0.4 - 0.6 g/dL   Beta 2 0.3 0.2 - 0.5 g/dL   Gamma Globulin 0.7 (L) 0.8 - 1.7 g/dL   Abnormal Protein Band1 NOT DET g/dL   SPE Interp. SEE NOTE     Comment: Potential early nephrotic pattern. Consider urine protein electrophoresis to confirm. Reviewed by Odis Hollingshead, MD, PhD, FCAP (Electronic Signature on File)    Abnormal Protein Band2 NOT DET g/dL   Abnormal Protein Band3 NOT DET g/dL  CBC w/Diff     Status: None   Collection Time: 08/31/15  3:38 PM  Result Value Ref Range   WBC 8.4 3.8 - 10.8 K/uL   RBC 5.08 4.20 - 5.80 MIL/uL   Hemoglobin 15.8 13.2 - 17.1 g/dL   HCT 46.3 38.5 - 50.0 %   MCV 91.1 80.0 - 100.0 fL   MCH 31.1 27.0 - 33.0 pg   MCHC 34.1 32.0 - 36.0 g/dL   RDW 13.8 11.0 - 15.0 %   Platelets 238 140 - 400 K/uL   MPV 10.8 7.5 - 12.5 fL   Neutro Abs 5,124 1,500 - 7,800 cells/uL   Lymphs Abs 2,436 850 - 3,900 cells/uL   Monocytes Absolute 756 200 - 950 cells/uL   Eosinophils Absolute 84 15 - 500 cells/uL   Basophils Absolute 0 0 - 200 cells/uL   Neutrophils Relative % 61 %   Lymphocytes Relative 29 %   Monocytes Relative 9 %   Eosinophils Relative 1 %   Basophils Relative 0 %   Smear Review Criteria for review not met     Comment: ** Please note change in unit of measure and reference range(s). **  Urine Microalbumin w/creat. ratio     Status: None   Collection Time: 08/31/15  3:38 PM  Result Value Ref Range   Creatinine, Urine 191 20 - 370 mg/dL   Microalb, Ur 0.6 Not estab mg/dL   Microalb Creat Ratio 3 <30 mcg/mg creat    Comment: The ADA has defined abnormalities in albumin excretion as follows:           Category            Result                            (  mcg/mg creatinine)                 Normal:    <30       Microalbuminuria:    30 - 299   Clinical albuminuria:    > or = 300   The ADA recommends that at least two of three specimens collected within a 3 - 6 month period be abnormal before considering a patient to be within a diagnostic category.     Urinalysis, Routine w reflex microscopic     Status: None   Collection Time: 08/31/15  3:38 PM  Result Value Ref Range   Color, Urine YELLOW YELLOW    Comment: ** Please note change in unit of measure and reference range(s). **      APPearance CLEAR CLEAR   Specific Gravity, Urine 1.024 1.001 - 1.035   pH 5.5 5.0 - 8.0   Glucose, UA NEGATIVE NEGATIVE   Bilirubin Urine NEGATIVE NEGATIVE   Ketones, ur NEGATIVE NEGATIVE   Hgb urine dipstick NEGATIVE NEGATIVE   Protein, ur NEGATIVE NEGATIVE   Nitrite NEGATIVE NEGATIVE   Leukocytes, UA NEGATIVE NEGATIVE  TSH     Status: None   Collection Time: 08/31/15  3:38 PM  Result Value Ref Range   TSH CANCELED 0.40 - 4.50 mIU/L    Comment: Test not performed, no serum was received.  Result canceled by the ancillary   HgB A1c     Status: None   Collection Time: 08/31/15  3:38 PM  Result Value Ref Range   Hgb A1c MFr Bld 5.5 <5.7 %    Comment:   For the purpose of screening for the presence of diabetes:   <5.7%       Consistent with the absence of diabetes 5.7-6.4 %   Consistent with increased risk for diabetes (prediabetes) >=6.5 %     Consistent with diabetes   This assay result is consistent with a decreased risk of diabetes.   Currently, no consensus exists regarding use of hemoglobin A1c for diagnosis of diabetes in children.   According to American Diabetes Association (ADA) guidelines, hemoglobin A1c <7.0% represents optimal control in non-pregnant diabetic patients. Different metrics may apply to specific patient populations. Standards of Medical Care in Diabetes (ADA).      Mean Plasma  Glucose 111 mg/dL  BASIC METABOLIC PANEL WITH GFR     Status: None   Collection Time: 08/31/15  3:39 PM  Result Value Ref Range   Sodium 140 135 - 146 mmol/L   Potassium 4.5 3.5 - 5.3 mmol/L   Chloride 103 98 - 110 mmol/L   CO2 26 20 - 31 mmol/L   Glucose, Bld 82 65 - 99 mg/dL   BUN 19 7 - 25 mg/dL   Creat 1.06 0.70 - 1.33 mg/dL    Comment:   For patients > or = 57 years of age: The upper reference limit for Creatinine is approximately 13% higher for people identified as African-American.      Calcium 9.4 8.6 - 10.3 mg/dL   GFR, Est African American >89 >=60 mL/min   GFR, Est Non African American 78 >=60 mL/min  TSH     Status: None   Collection Time: 08/31/15  3:39 PM  Result Value Ref Range   TSH 1.04 0.40 - 4.50 mIU/L  Sed Rate (ESR)     Status: None   Collection Time: 09/07/15  8:52 AM  Result Value Ref Range   Sed Rate 3 0 -  20 mm/hr  C-reactive protein     Status: None   Collection Time: 09/07/15  8:52 AM  Result Value Ref Range   CRP 0.9 0.5 - 20.0 mg/dL  UPEP/UIFE/Light Chains/TP, 24-Hr Ur     Status: Abnormal   Collection Time: 09/15/15  9:25 AM  Result Value Ref Range   Protein, Ur 5.1 Not Estab. mg/dL   Protein, 24H Urine 153 (H) 30 - 150 mg/24 hr   ALBUMIN, U 100.0 %   ALPHA 1 URINE 0.0 %   ALPHA-2-GLOBULIN, U 0.0 %   % Beta 0.0 %   GAMMA GLOBULIN URINE 0.0 %   M-SPIKE, % Not Observed Not Observed %   Immunofixation, Urine Comment     Comment: An apparent normal immunofixation pattern.   NOTE: Comment     Comment: Protein electrophoresis scan will follow via computer, mail, or courier delivery.    PDF .    Free Kappa Lt Chains,Ur 8.01 1.35 - 24.19 mg/L    Comment: **Results verified by repeat testing**   Free Lambda Lt Chains,Ur 0.42 0.24 - 6.66 mg/L    Comment: **Results verified by repeat testing**   Kappa/Lambda Ratio,U 19.07 (H) 2.04 - 10.37     Allergies  Allergen Reactions  . Cephalosporins Rash    hives  . Ciprofloxacin Rash    Rash    . Contrast Media [Iodinated Diagnostic Agents] Hives, Itching and Rash    GADOLINIUM  . Omeprazole Rash    REACTION: rash  . Penicillins Rash and Itching    Weezing, blisters rash  . Shrimp [Shellfish Allergy] Rash    rash  . Sulfonamide Derivatives     Rash    Social History  Substance Use Topics  . Smoking status: Former Smoker    Types: Cigars  . Smokeless tobacco: Never Used     Comment:  RARE ; <1 -2 cigar / YEAR  . Alcohol use Yes     Comment: rare ; 0-1 drink / month   Past Medical History:  Diagnosis Date  . Adenomatous colon polyp 2008   Dr Deatra Ina, GI  . Allergic rhinitis   . Anxiety   . Diverticulosis   . Gallbladder attack 2015   stones present  . Gastritis   . Hyperlipemia 2009  . IBS (irritable bowel syndrome)   . Internal hemorrhoids 2012   banded   . Lichen planus   . Sleep apnea    [facility]   Past Surgical History:  Procedure Laterality Date  . COLONOSCOPY  04/10/2010, 08/12/2006   3/12 with banding; 2008: adenomatous polyp, diverticulosis  . SEPTOPLASTY    . thumb surgery    . TONSILLECTOMY    . UPPER GASTROINTESTINAL ENDOSCOPY  10/24/2001   gastritis  . UPPER GASTROINTESTINAL ENDOSCOPY  11/03/12   Family History  Problem Relation Age of Onset  . Thyroid disease Mother     hypothyroidism  . Allergies Mother   . Parkinson's disease Mother     not definite  . Atrial fibrillation Mother   . Colitis Father   . Alcohol abuse Paternal Grandfather      Medication List       Accurate as of 09/28/15  3:02 PM. Always use your most recent med list.          azelastine 0.1 % nasal spray Commonly known as:  ASTELIN Place 2 sprays into both nostrils 2 (two) times daily. Use in each nostril as directed   cetirizine 10 MG  tablet Commonly known as:  ZYRTEC Take 10 mg by mouth daily.   fluticasone 50 MCG/ACT nasal spray Commonly known as:  FLONASE Place into both nostrils daily.   LORazepam 0.5 MG tablet Commonly known  as:  ATIVAN Reported on 06/10/2015   montelukast 10 MG tablet Commonly known as:  SINGULAIR Take 1 tablet (10 mg total) by mouth at bedtime.       No results found for this or any previous visit (from the past 24 hour(s)). No results found.   ROS: Negative, with the exception of above mentioned in HPI   Objective:  BP 131/87 (BP Location: Left Arm, Patient Position: Sitting, Cuff Size: Large)   Pulse 81   Temp 98.5 F (36.9 C)   Resp 20   Ht '6\' 1"'$  (1.854 m)   Wt 281 lb 8 oz (127.7 kg)   SpO2 96%   BMI 37.14 kg/m  Body mass index is 37.14 kg/m. Gen: Afebrile. No acute distress. Nontoxic in appearance, well developed, well nourished. Obese. Pleasant caucasian male.  HENT: AT. [place].  MMM. Skin: Mild hyperpigmented rash bilateral dorsal hands.  Neuro: Normal gait.Alert. Oriented x3  Psych: Normal affect, dress and demeanor. Normal speech. Normal thought content and judgment.  Assessment/Plan: Russell Thomas is a 55 y.o. male present for acute OV for  Paraproteinemia/abnormal SPEP/UPEP: - no systemic signs of nephrotic syndrome/kidney disease or liver disease. Pt endorses mild fatigue, questionable rash of hands and "frothy" urine.  - Discussed in detail left results with patient and wife today. Discussed this could be an early pathology versus abnormal variant/false positive labs and further studies and specialist recommendations would be needed from this point forward. Referral to heme/onc has been placed and patient has an appointment set up with them.   electronically signed by:  Howard Pouch, DO  [facility]

## 2015-09-30 DIAGNOSIS — R778 Other specified abnormalities of plasma proteins: Secondary | ICD-10-CM | POA: Insufficient documentation

## 2015-10-17 ENCOUNTER — Ambulatory Visit (HOSPITAL_BASED_OUTPATIENT_CLINIC_OR_DEPARTMENT_OTHER): Payer: PRIVATE HEALTH INSURANCE | Admitting: Hematology

## 2015-10-17 ENCOUNTER — Telehealth: Payer: Self-pay | Admitting: Hematology

## 2015-10-17 ENCOUNTER — Encounter: Payer: Self-pay | Admitting: Hematology

## 2015-10-17 VITALS — BP 133/81 | HR 81 | Temp 99.0°F | Resp 18 | Ht 73.0 in | Wt 279.6 lb

## 2015-10-17 DIAGNOSIS — D472 Monoclonal gammopathy: Secondary | ICD-10-CM | POA: Diagnosis not present

## 2015-10-17 NOTE — Progress Notes (Signed)
[facility]    HEMATOLOGY/ONCOLOGY CONSULTATION NOTE  Date of Service: 10/17/2015  Patient Care Team: Ma Hillock, DO as PCP - General (Family Medicine) Marshell Garfinkel, MD as Consulting Physician (Pulmonary Disease) Inda Castle, MD as Consulting Physician (Gastroenterology)  CHIEF COMPLAINTS/PURPOSE OF CONSULTATION:  Paraproteinemia  HISTORY OF PRESENTING ILLNESS:   Russell Thomas is a 55 y.o. male who has been referred to [place] by Dr .Howard Pouch, DO for evaluation and management of paraproteinemia.  Patient has a history of sleep apnea on CPAP, dyslipidemia, anxiety, severe allergic rhinitis, irritable bowel syndrome Who had issues with sinusitis and upper respiratory infection and some right cervical lymphadenitis.  He first had an SPEP on 02/14/2015 that showed possibility of a faint restricted band in the beta 1 region. On IFE there was a band of restricted mobility in the IgG and kappa lanes. It is unclear why the SPEP was done in the first place.  SPEP was repeated by the primary care physician on 08/19/2015 and no abnormal M spike was detected. His gammaglobulin levels were borderline reduced at 0.7 g/dL. SPEP was interpreted as possible early nephrotic pattern.  Patient was referred to [place] for possible paraproteinemia. Patient has no focal bone pains. Labs today showed no anemia. He has not had any previous renal failure or hypercalcemia. No other new focal concerns.  No weight loss. No new fatigue. Has been working out regularly.  MEDICAL HISTORY:  Past Medical History:  Diagnosis Date  . Adenomatous colon polyp 2008   Dr Deatra Ina, GI  . Allergic rhinitis   . Anxiety   . Diverticulosis   . Gallbladder attack 2015   stones present  . Gastritis   . Hyperlipemia 2009  . IBS (irritable bowel syndrome)   . Internal hemorrhoids 2012   banded   . Lichen planus   . Sleep apnea    [facility]    SURGICAL HISTORY: Past Surgical History:  Procedure  Laterality Date  . COLONOSCOPY  04/10/2010, 08/12/2006   3/12 with banding; 2008: adenomatous polyp, diverticulosis  . SEPTOPLASTY    . thumb surgery    . TONSILLECTOMY    . UPPER GASTROINTESTINAL ENDOSCOPY  10/24/2001   gastritis  . UPPER GASTROINTESTINAL ENDOSCOPY  11/03/12    SOCIAL HISTORY: Social History   Social History  . Marital status: Married    Spouse name: N/A  . Number of children: 1  . Years of education: N/A   Occupational History  . [employer]    Social History Main Topics  . Smoking status: Former Smoker    Types: Cigars  . Smokeless tobacco: Never Used     Comment:  RARE ; <1 -2 cigar / YEAR  . Alcohol use Yes     Comment: rare ; 0-1 drink / month  . Drug use: No  . Sexual activity: Yes   Other Topics Concern  . 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           FAMILY HISTORY: Family History  Problem Relation Age of Onset  . Thyroid disease Mother     hypothyroidism  . Allergies Mother   . Parkinson's disease Mother     not definite  . Atrial fibrillation Mother   . Colitis Father   . Alcohol abuse Paternal Grandfather  ALLERGIES:  is allergic to cephalosporins; ciprofloxacin; contrast media [iodinated diagnostic agents]; omeprazole; penicillins; shrimp [shellfish allergy]; and sulfonamide derivatives.  MEDICATIONS:  Current Outpatient Prescriptions  Medication Sig Dispense Refill  . azelastine (ASTELIN) 0.1 % nasal spray Place 2 sprays into both nostrils 2 (two) times daily. Use in each nostril as directed 30 mL 11  . cetirizine (ZYRTEC) 10 MG tablet Take 10 mg by mouth daily.    . fluticasone (FLONASE) 50 MCG/ACT nasal spray Place into both nostrils daily.    Marland Kitchen LORazepam (ATIVAN) 0.5 MG tablet Reported on 06/10/2015    . montelukast (SINGULAIR) 10 MG tablet Take 1 tablet (10 mg total) by mouth at bedtime. 30 tablet 3     No current facility-administered medications for this visit.     REVIEW OF SYSTEMS:    10 Point review of Systems was done is negative except as noted above.  PHYSICAL EXAMINATION: ECOG PERFORMANCE STATUS: 1 - Symptomatic but completely ambulatory  . Vitals:   10/17/15 1407  BP: 133/81  Pulse: 81  Resp: 18  Temp: 99 F (37.2 C)   Filed Weights   10/17/15 1407  Weight: 279 lb 9.6 oz (126.8 kg)   .Body mass index is 36.89 kg/m.  GENERAL:alert, in no acute distress and comfortable SKIN: skin color, texture, turgor are normal, no rashes or significant lesions EYES: normal, conjunctiva are pink and non-injected, sclera clear OROPHARYNX:no exudate, no erythema and lips, buccal mucosa, and tongue normal  NECK: supple, no JVD, thyroid normal size, non-tender, without nodularity LYMPH:  no palpable lymphadenopathy in the cervical, axillary or inguinal LUNGS: clear to auscultation with normal respiratory effort HEART: regular rate & rhythm,  no murmurs and no lower extremity edema ABDOMEN: abdomen soft, non-tender, normoactive bowel sounds , no palpable hepato-splenomegaly. Musculoskeletal: no cyanosis of digits and no clubbing  PSYCH: alert & oriented x 3 with fluent speech NEURO: no focal motor/sensory deficits  LABORATORY DATA:  I have reviewed the data as listed  . CBC Latest Ref Rng & Units 10/18/2015 08/31/2015 08/19/2015  WBC 4.0 - 10.3 10e3/uL 7.0 8.4 8.0  Hemoglobin 13.0 - 17.1 g/dL 15.4 15.8 14.6  Hematocrit 38.4 - 49.9 % 44.2 46.3 42.9  Platelets 140 - 400 10e3/uL 195 238 192.0    . CMP Latest Ref Rng & Units 10/18/2015 08/31/2015 08/19/2015  Glucose 65 - 99 mg/dL - 82 103(H)  BUN 7 - 25 mg/dL - 19 17  Creatinine 0.70 - 1.33 mg/dL - 1.06 1.01  Sodium 135 - 146 mmol/L - 140 140  Potassium 3.5 - 5.3 mmol/L - 4.5 4.7  Chloride 98 - 110 mmol/L - 103 105  CO2 20 - 31 mmol/L - 26 28  Calcium 8.6 - 10.3 mg/dL - 9.4 9.5  Total Protein 6.0 - 8.5 g/dL 6.8 - 6.9   Total Bilirubin 0.2 - 1.2 mg/dL - - 0.5  Alkaline Phos 39 - 117 U/L - - 47  AST 0 - 37 U/L - - 18  ALT 0 - 53 U/L - - 25   Component     Latest Ref Rng & Units 09/07/2015 10/18/2015  Sed Rate     0 - 20 mm/hr 3   CRP     0.5 - 20.0 mg/dL 0.9   LDH     125 - 245 U/L  156          RADIOGRAPHIC STUDIES: I have personally reviewed the radiological images as listed and agreed with the findings in the report.  No results found.  ASSESSMENT & PLAN:   55 yo referred for possible paraproteinemia  1) Possible Paraproteinemia.  Patient has no Anemia, hypercalcemia, renal failure, bone pains. Patient's myeloma panel with SPEP and IFE shows no monoclonal paraproteinemia. Quantitative immunoglobulin levels are within normal limits. Serum kappa and lambda free light chains are within normal limits with a normal ratio. UPEP shows no evidence of a monoclonal protein within normal immunofixation pattern. Plan -Patient has no evidence of monoclonal paraproteinemia based on these labs. -I discussed what these labs meant and provided patient education material regarding this. -No further workup is recommended with regards to the paraproteinemia unless there is a change in his clinical context. -Bone marrow examination is not recommended. -Patient and his sister had multiple questions regarding the lab results which were answered in details.  -Continue follow-up with primary care physician for management of other comorbidities.  All of the patients questions were answered with apparent satisfaction. The patient knows to call the clinic with any problems, questions or concerns.  I spent 30 minutes counseling the patient face to face. The total time spent in the appointment was 40 minutes and more than 50% was on counseling and direct patient cares.    Sullivan Lone MD [place] AAHIVMS [facility] Hematology/Oncology Physician [facility]  (Office):       [phone] (Work cell):   [phone] (Fax):           [phone]  10/17/2015 2:14 PM

## 2015-10-17 NOTE — Telephone Encounter (Signed)
GAVE PATIENT AVS REPORT AND APPOINTMENT FOR LAB TOMORROW. PER 9/11 LOS F/U AS NEEDED - PATIENT AWARE.

## 2015-10-18 ENCOUNTER — Other Ambulatory Visit (HOSPITAL_BASED_OUTPATIENT_CLINIC_OR_DEPARTMENT_OTHER): Payer: PRIVATE HEALTH INSURANCE

## 2015-10-18 DIAGNOSIS — D472 Monoclonal gammopathy: Secondary | ICD-10-CM | POA: Diagnosis not present

## 2015-10-18 LAB — CBC & DIFF AND RETIC
BASO%: 0.1 % (ref 0.0–2.0)
Basophils Absolute: 0 10*3/uL (ref 0.0–0.1)
EOS ABS: 0.1 10*3/uL (ref 0.0–0.5)
EOS%: 1.4 % (ref 0.0–7.0)
HEMATOCRIT: 44.2 % (ref 38.4–49.9)
HEMOGLOBIN: 15.4 g/dL (ref 13.0–17.1)
Immature Retic Fract: 4.1 % (ref 3.00–10.60)
LYMPH%: 31.6 % (ref 14.0–49.0)
MCH: 31.4 pg (ref 27.2–33.4)
MCHC: 34.8 g/dL (ref 32.0–36.0)
MCV: 90.2 fL (ref 79.3–98.0)
MONO#: 0.6 10*3/uL (ref 0.1–0.9)
MONO%: 8.6 % (ref 0.0–14.0)
NEUT%: 58.3 % (ref 39.0–75.0)
NEUTROS ABS: 4.1 10*3/uL (ref 1.5–6.5)
PLATELETS: 195 10*3/uL (ref 140–400)
RBC: 4.9 10*6/uL (ref 4.20–5.82)
RDW: 12.9 % (ref 11.0–14.6)
Retic %: 1.61 % (ref 0.80–1.80)
Retic Ct Abs: 78.89 10*3/uL (ref 34.80–93.90)
WBC: 7 10*3/uL (ref 4.0–10.3)
lymph#: 2.2 10*3/uL (ref 0.9–3.3)

## 2015-10-18 LAB — LACTATE DEHYDROGENASE: LDH: 156 U/L (ref 125–245)

## 2015-10-19 ENCOUNTER — Encounter: Payer: Self-pay | Admitting: *Deleted

## 2015-10-19 LAB — KAPPA/LAMBDA LIGHT CHAINS
IG KAPPA FREE LIGHT CHAIN: 23.7 mg/L — AB (ref 3.3–19.4)
IG LAMBDA FREE LIGHT CHAIN: 23.2 mg/L (ref 5.7–26.3)
KAPPA/LAMBDA FLC RATIO: 1.02 (ref 0.26–1.65)

## 2015-10-19 NOTE — Progress Notes (Signed)
Late entry from 10/18/15: This RN went to call out another patient's name, Russell Thomas (both 1st and last name called out) for a phlebotomy to assist another nurse. When I paged patient, no patient came back to the infusion room at the time of page.  This patient stood up and walked toward me.  I acknowledge him as Russell Thomas and he nodded. I extended my hand out and introduced myself, and stated I would be the nurse to perform his phlebotomy.  I asked if he's familiar with our infusion room and if he has had a phlebotomy before; he stated yes he had.  I asked if he'd eaten breakfast; he stated no, but he drank coffee and usually doesn't have any trouble "when coming here".  I asked him where the best place on his arm to perform his phlebotomy. He stated, "they have a hard time with my right arm, so we can do my left arm?" I stated that was fine.  I performed VS at 0915: BP 132/84, HR 79, temp 98.2, RR 18, oxygen 100%. Prior to starting the phlebotomy, I asked if he had seen the physician, and he stated he did yesterday; "I saw him yesterday but they couldn't add me on because you guys were closed". I asked him what time he saw the physician: "Around 215, and was done about 345". I again asked if he was here for a phlebotomy procedure to have a pint of blood drawn from his arm, and he stated yes. I explained to patient the phlebotomy procedure, and patient agreed and verbalized understanding of today's visit. 1 unit phlebotomy performed from [phone]; patient tolerated well.  At this point, he pulled out his AVS from yesterday's office visit and stated, I need my labs drawn too. I looked in the computer under Russell Thomas and saw those labs were not ordered. I called lab and when I mentioned the name Russell Thomas, the patient stated, my name is Russell Thomas.  At that point, I hung up with the lab and explained the error in which I called another patient's name and this patient stood up. He stated, "well I only heard  Russell Thomas". I explained my error and stated that he would need to be stuck again for his labs.  Patient was visibly upset and did not want me to perform VS on him, and requested he be taken to the lab.  I took the patient to [employer] in the lab and explained the situation.  Pt voiced he was okay, but just concerned, to which I apologized again and assured him we would draw his labs appropriately. Gabriel Cirri checked patient's arm band and verified all labs that needed to be drawn. Patient did state that if his wife was here, she would have caught that he did not need to be in infusion; I again apologized and he stated he was fine, just concerned about making sure his information in the system was correct. Dr. Irene Limbo notified of error; no new actions taken. Patient was discharged from the lab in no acute distress.

## 2015-10-20 LAB — MULTIPLE MYELOMA PANEL, SERUM
ALBUMIN SERPL ELPH-MCNC: 4 g/dL (ref 2.9–4.4)
Albumin/Glob SerPl: 1.5 (ref 0.7–1.7)
Alpha 1: 0.2 g/dL (ref 0.0–0.4)
Alpha2 Glob SerPl Elph-Mcnc: 0.6 g/dL (ref 0.4–1.0)
B-GLOBULIN SERPL ELPH-MCNC: 1.1 g/dL (ref 0.7–1.3)
Gamma Glob SerPl Elph-Mcnc: 0.8 g/dL (ref 0.4–1.8)
Globulin, Total: 2.8 g/dL (ref 2.2–3.9)
IGG (IMMUNOGLOBIN G), SERUM: 788 mg/dL (ref 700–1600)
IgA, Qn, Serum: 176 mg/dL (ref 90–386)
IgM, Qn, Serum: 54 mg/dL (ref 20–172)
TOTAL PROTEIN: 6.8 g/dL (ref 6.0–8.5)

## 2015-10-21 ENCOUNTER — Telehealth: Payer: Self-pay | Admitting: *Deleted

## 2015-10-21 NOTE — Telephone Encounter (Signed)
Pt called to inquire about lab results from 9/12.   Labs reviewed with Dr. Irene Limbo.  Labs appear WNL.  Pt notified.  Instructed patient to follow up with PCP and contact us with additional questions/concerns.  Pt verbalized understanding.

## 2015-11-22 ENCOUNTER — Telehealth: Payer: Self-pay | Admitting: Hematology

## 2015-11-22 NOTE — Telephone Encounter (Signed)
Faxed pt records to dr Laurann Montana  [phone]

## 2016-01-23 ENCOUNTER — Ambulatory Visit: Payer: PRIVATE HEALTH INSURANCE | Admitting: Family Medicine

## 2016-01-27 ENCOUNTER — Encounter: Payer: PRIVATE HEALTH INSURANCE | Admitting: Family Medicine

## 2016-04-11 ENCOUNTER — Encounter: Payer: Self-pay | Admitting: *Deleted

## 2016-12-03 HISTORY — PX: BIOPSY PROSTATE: PRO28

## 2016-12-17 ENCOUNTER — Other Ambulatory Visit: Payer: Self-pay | Admitting: Internal Medicine

## 2016-12-17 DIAGNOSIS — K824 Cholesterolosis of gallbladder: Secondary | ICD-10-CM

## 2016-12-18 NOTE — Progress Notes (Signed)
[facility]    HEMATOLOGY/ONCOLOGY CONSULTATION NOTE  Date of Service: 12/19/2016  Patient Care Team: Ma Hillock, DO as PCP - General (Family Medicine) Marshell Garfinkel, MD as Consulting Physician (Pulmonary Disease) Inda Castle, MD as Consulting Physician (Gastroenterology)  CHIEF COMPLAINTS/PURPOSE OF CONSULTATION:  Paraproteinemia  HISTORY OF PRESENTING ILLNESS:   Russell Thomas is a 56 y.o. male who has been referred to [place] by Dr .Raoul Pitch, Reinaldo Raddle, DO for evaluation and management of paraproteinemia.  Patient has a history of sleep apnea on CPAP, dyslipidemia, anxiety, severe allergic rhinitis, irritable bowel syndrome Who had issues with sinusitis and upper respiratory infection and some right cervical lymphadenitis.  He first had an SPEP on 02/14/2015 that showed possibility of a faint restricted band in the beta 1 region. On IFE there was a band of restricted mobility in the IgG and kappa lanes. It is unclear why the SPEP was done in the first place.  SPEP was repeated by the primary care physician on 08/19/2015 and no abnormal M spike was detected. His gammaglobulin levels were borderline reduced at 0.7 g/dL. SPEP was interpreted as possible early nephrotic pattern.  Patient was referred to [place] for possible paraproteinemia. Patient has no focal bone pains. Labs today showed no anemia. He has not had any previous renal failure or hypercalcemia. No other new focal concerns.  No weight loss. No new fatigue. Has been working out regularly.  INTERVAL HISTORY:   Pt presents to the office today for discussion regarding newly diagnosed prostate cancer. He reports that he is doing well overall. He notes that he has started eating better and discontinuing his social alcohol consumption since his diagnosis. He notes that he has been fasting as well. He is attending a [place] on 12/24/2016, to connect with other individuals.   On 12/06/2016, he was diagnosed with  prostate cancer due to an elevated PSA level of 5.68 on 10/23/2016. He was noted to have an elevated PSA of 5.68 on 10/23/2016 by his primary care physician, Dr. Laurann Montana at [facility]. Accordingly, he was referred for evaluation in urology by Dr. Ardis Hughs at [facility]. The patient proceeded to transrectal ultrasound with biopsies of the prostate on 12/03/2016.  On 12/03/2016, out of 12 core biopsies, 2 were positive with over 90% of the core involved. The maximum Gleason score was 3 + 4, and this was seen in right base lateral and right mid lateral.  His prior PSAs are as follows: 5.1 in 2015, 5.3 in 2016, 5.4 in 2017, 4.12 on 02/23/16, 4.91 on 03/25/16, and most recently 5.68 on 10/23/2016. He was advised by Dr. Louis Meckel, that surgery may be the best option to aid with his new diagnosis of prostate cancer. He will have a second opinion set up with Dr. Alinda Money on next Tuesday, 12/25/2016 regarding his pending surgical procedure. He reports that he doesn't have a prior family history of prostate cancer.   On review of systems, pt denies any other symptoms.    MEDICAL HISTORY:  Past Medical History:  Diagnosis Date  . Adenomatous colon polyp 2008   Dr Deatra Ina, GI  . Allergic rhinitis   . Anxiety   . Diverticulosis   . Gallbladder attack 2015   stones present  . Gastritis   . Hyperlipemia 2009  . IBS (irritable bowel syndrome)   . Internal hemorrhoids 2012   banded   . Lichen planus   . Sleep apnea    [facility]  SURGICAL HISTORY: Past Surgical History:  Procedure Laterality Date  . COLONOSCOPY  04/10/2010, 08/12/2006   3/12 with banding; 2008: adenomatous polyp, diverticulosis  . SEPTOPLASTY    . thumb surgery    . TONSILLECTOMY    . UPPER GASTROINTESTINAL ENDOSCOPY  10/24/2001   gastritis  . UPPER GASTROINTESTINAL ENDOSCOPY  11/03/12    SOCIAL HISTORY: Social History   Socioeconomic History  . Marital status: Married    Spouse name: Not on  file  . Number of children: 1  . Years of education: Not on file  . Highest education level: Not on file  Social Needs  . Financial resource strain: Not on file  . Food insecurity - worry: Not on file  . Food insecurity - inability: Not on file  . Transportation needs - medical: Not on file  . Transportation needs - non-medical: Not on file  Occupational History  . Occupation: Scientist, clinical (histocompatibility and immunogenetics): [employer]   Tobacco Use  . Smoking status: Former Smoker    Types: Cigars  . Smokeless tobacco: Never Used  . Tobacco comment:  RARE ; <1 -2 cigar / YEAR  Substance and Sexual Activity  . Alcohol use: Yes    Comment: rare ; 0-1 drink / month  . Drug use: No  . Sexual activity: Yes  Other Topics Concern  . 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        FAMILY HISTORY: Family History  Problem Relation Age of Onset  . Thyroid disease Mother        hypothyroidism  . Allergies Mother   . Parkinson's disease Mother        not definite  . Atrial fibrillation Mother   . Colitis Father   . Alcohol abuse Paternal Grandfather     ALLERGIES:  is allergic to cephalosporins; ciprofloxacin; contrast media [iodinated diagnostic agents]; omeprazole; penicillins; shrimp [shellfish allergy]; and sulfonamide derivatives.  MEDICATIONS:  Current Outpatient Medications  Medication Sig Dispense Refill  . azelastine (ASTELIN) 0.1 % nasal spray Place 2 sprays into both nostrils 2 (two) times daily. Use in each nostril as directed (Patient not taking: Reported on 10/17/2015) 30 mL 11  . cetirizine (ZYRTEC) 10 MG tablet Take 10 mg by mouth daily.    . fluticasone (FLONASE) 50 MCG/ACT nasal spray Place into both nostrils daily.    Marland Kitchen LORazepam (ATIVAN) 0.5 MG tablet Reported on 06/10/2015    . montelukast (SINGULAIR) 10 MG tablet Take 1 tablet (10 mg total) by mouth at bedtime. (Patient not  taking: Reported on 10/17/2015) 30 tablet 3   No current facility-administered medications for this visit.     REVIEW OF SYSTEMS:    10 Point review of Systems was done is negative except as noted above.  PHYSICAL EXAMINATION:  ECOG PERFORMANCE STATUS: 1 - Symptomatic but completely ambulatory  . Vitals:   12/19/16 0846  BP: (!) 141/83  Pulse: 83  Resp: 18  Temp: (!) 97.5 F (36.4 C)  SpO2: 98%   Filed Weights   12/19/16 0846  Weight: 297 lb 11.2 oz (135 kg)   .Body mass index is 39.28 kg/m.  GENERAL:alert, in no acute distress and comfortable SKIN: skin color, texture, turgor are normal, no rashes or significant lesions EYES: normal, conjunctiva are pink and non-injected, sclera clear NECK: supple LUNGS: no respiratory  distress HEART: regular rate Musculoskeletal: no cyanosis of digits and no clubbing  PSYCH: alert & oriented x 3 with fluent speech NEURO: no focal motor/sensory deficits  LABORATORY DATA:  I have reviewed the data as listed  . CBC Latest Ref Rng & Units 10/18/2015 08/31/2015 08/19/2015  WBC 4.0 - 10.3 10e3/uL 7.0 8.4 8.0  Hemoglobin 13.0 - 17.1 g/dL 15.4 15.8 14.6  Hematocrit 38.4 - 49.9 % 44.2 46.3 42.9  Platelets 140 - 400 10e3/uL 195 238 192.0    . CMP Latest Ref Rng & Units 10/18/2015 08/31/2015 08/19/2015  Glucose 65 - 99 mg/dL - 82 103(H)  BUN 7 - 25 mg/dL - 19 17  Creatinine 0.70 - 1.33 mg/dL - 1.06 1.01  Sodium 135 - 146 mmol/L - 140 140  Potassium 3.5 - 5.3 mmol/L - 4.5 4.7  Chloride 98 - 110 mmol/L - 103 105  CO2 20 - 31 mmol/L - 26 28  Calcium 8.6 - 10.3 mg/dL - 9.4 9.5  Total Protein 6.0 - 8.5 g/dL 6.8 - 6.9  Total Bilirubin 0.2 - 1.2 mg/dL - - 0.5  Alkaline Phos 39 - 117 U/L - - 47  AST 0 - 37 U/L - - 18  ALT 0 - 53 U/L - - 25   Component     Latest Ref Rng & Units 09/07/2015 10/18/2015  Sed Rate     0 - 20 mm/hr 3   CRP     0.5 - 20.0 mg/dL 0.9   LDH     125 - 245 U/L  156          RADIOGRAPHIC STUDIES: I have  personally reviewed the radiological images as listed and agreed with the findings in the report. No results found.  ASSESSMENT & PLAN:   56 yo referred for possible paraproteinemia  1) Possible Paraproteinemia.  Patient has no Anemia, hypercalcemia, renal failure, bone pains. Patient's myeloma panel with SPEP and IFE shows no monoclonal paraproteinemia. Quantitative immunoglobulin levels are within normal limits. Serum kappa and lambda free light chains are within normal limits with a normal ratio. UPEP shows no evidence of a monoclonal protein within normal immunofixation pattern.  -Continue follow-up with primary care physician for management of other comorbidities.   2) Newly diagnosed T1c localized prostate adenocarcinoma Gleasons 3+4 with >90% involvement of 2 prostate Bx cores. Intermediate risk. PLan -patient has f/u with Dr Alinda Money to discuss prostate surgery option. -might need baseline CT abd/pelvis to clinical evaluate for lymphadenopathy. -role for adjuvant radiation therapy or hormone blocking therapy based on surgical pathology results.  Plan:  -Continue follow up with [facility] -Follow up in the office PRN   RTC with Dr. Irene Limbo PRN  All of the patients questions were answered with apparent satisfaction. The patient knows to call the clinic with any problems, questions or concerns. I spent 30 minutes counseling the patient face to face. The total time spent in the appointment was 30minutes and more than 50% was on counseling and direct patient cares.    Sullivan Lone MD [place] [facility] Hematology/Oncology Physician [place]  (Office):       [phone] (Work cell):  [phone] (Fax):           [phone]  12/19/2016 8:51 AM   This document serves as a record of services personally performed by Sullivan Lone, MD. It was created on his behalf by Steva Colder, a trained medical scribe. The creation of this record is based on the  scribe's personal observations and the provider's statements to them.   .I have reviewed the above documentation for accuracy and completeness, and I agree with the above. Brunetta Genera MD

## 2016-12-19 ENCOUNTER — Encounter: Payer: Self-pay | Admitting: Hematology

## 2016-12-19 ENCOUNTER — Ambulatory Visit (HOSPITAL_BASED_OUTPATIENT_CLINIC_OR_DEPARTMENT_OTHER): Payer: PRIVATE HEALTH INSURANCE | Admitting: Hematology

## 2016-12-19 VITALS — BP 141/83 | HR 83 | Temp 97.5°F | Resp 18 | Ht 73.0 in | Wt 297.7 lb

## 2016-12-19 DIAGNOSIS — C61 Malignant neoplasm of prostate: Secondary | ICD-10-CM

## 2016-12-19 DIAGNOSIS — D472 Monoclonal gammopathy: Secondary | ICD-10-CM

## 2016-12-19 NOTE — Patient Instructions (Signed)
Thank you for choosing [facility] to provide your oncology and hematology care.  To afford each patient quality time with our providers, please arrive 30 minutes before your scheduled appointment time.  If you arrive late for your appointment, you may be asked to reschedule.  We strive to give you quality time with our providers, and arriving late affects you and other patients whose appointments are after yours.   If you are a no show for multiple scheduled visits, you may be dismissed from the clinic at the providers discretion.    Again, thank you for choosing [facility], our hope is that these requests will decrease the amount of time that you wait before being seen by our physicians.  ______________________________________________________________________  Should you have questions after your visit to the  Cancer Center, please contact our office at [phone] between the hours of 8:30 and 4:30 p.m.    Voicemails left after 4:30p.m will not be returned until the following business day.    For prescription refill requests, please have your pharmacy contact us directly.  Please also try to allow 48 hours for prescription requests.    Please contact the scheduling department for questions regarding scheduling.  For scheduling of procedures such as PET scans, CT scans, MRI, Ultrasound, etc please contact central scheduling at [phone].    Resources For Cancer Patients and Caregivers:   Oncolink.org:  A wonderful resource for patients and healthcare providers for information regarding your disease, ways to tract your treatment, what to expect, etc.     American Cancer Society:  800-227-2345  Can help patients locate various types of support and financial assistance  Cancer Care: 1-800-813-HOPE (4673) Provides financial assistance, online support groups, medication/co-pay assistance.    [place] DSS:  336-641-3447 Where to apply for food  stamps, Medicaid, and utility assistance  Medicare Rights Center: 800-333-4114 Helps people with Medicare understand their rights and benefits, navigate the Medicare system, and secure the quality healthcare they deserve  SCAT: 336-333-6589 [employer] [employer]'s shared-ride transportation service for eligible riders who have a disability that prevents them from riding the fixed route bus.    For additional information on assistance programs please contact our social worker:   Grier Hock/Abigail Elmore:  [phone]            

## 2016-12-21 ENCOUNTER — Ambulatory Visit
Admission: RE | Admit: 2016-12-21 | Discharge: 2016-12-21 | Disposition: A | Discharge status: Home / Self Care | Payer: PRIVATE HEALTH INSURANCE | Source: Ambulatory Visit | Attending: Internal Medicine | Admitting: Internal Medicine

## 2016-12-21 DIAGNOSIS — K824 Cholesterolosis of gallbladder: Secondary | ICD-10-CM

## 2016-12-25 ENCOUNTER — Other Ambulatory Visit: Payer: Self-pay | Admitting: Urology

## 2016-12-25 DIAGNOSIS — C61 Malignant neoplasm of prostate: Secondary | ICD-10-CM

## 2017-01-04 ENCOUNTER — Other Ambulatory Visit: Payer: Self-pay | Admitting: Urology

## 2017-01-21 ENCOUNTER — Other Ambulatory Visit: Payer: Self-pay | Admitting: Urology

## 2017-01-21 ENCOUNTER — Ambulatory Visit
Admission: RE | Admit: 2017-01-21 | Discharge: 2017-01-21 | Disposition: A | Discharge status: Home / Self Care | Payer: PRIVATE HEALTH INSURANCE | Source: Ambulatory Visit | Attending: Urology | Admitting: Urology

## 2017-01-21 DIAGNOSIS — C61 Malignant neoplasm of prostate: Secondary | ICD-10-CM

## 2017-01-21 MED ORDER — GADOBENATE DIMEGLUMINE 529 MG/ML IV SOLN: 20.0000 mL | Freq: Once | INTRAVENOUS | Status: DC | PRN

## 2017-01-22 ENCOUNTER — Other Ambulatory Visit (HOSPITAL_COMMUNITY): Payer: Self-pay | Admitting: Urology

## 2017-01-22 DIAGNOSIS — C61 Malignant neoplasm of prostate: Secondary | ICD-10-CM

## 2017-02-08 NOTE — Patient Instructions (Addendum)
Russell Thomas  02/08/2017   Your procedure is scheduled on: 02-14-17  Report to [facility] Main  Entrance    Report to admitting at 8:45AM  [place]. DEVICE WILL BE PROVIDED!   Call this number if you have problems the morning of surgery [phone]    Remember: Do not eat food After Midnight on Tuesday 02-12-17. Drink plenty of clear liquids all day Wednesday 02-13-17 and follow all bowel prep orders provided by your surgeon. Nothing by mouth after midnight on Wednesday!     Take these medicines the morning of surgery with A SIP OF WATER: NONE                                You may not have any metal on your body including hair pins and              piercings  Do not wear jewelry, make-up, lotions, powders or perfumes, deodorant              Men may shave face and neck.   Do not bring valuables to the hospital. [place].  Contacts, dentures or bridgework may not be worn into surgery.  Leave suitcase in the car. After surgery it may be brought to your room.                 Please read over the following fact sheets you were given: _____________________________________________________________________    CLEAR LIQUID DIET   Foods Allowed                                                                     Foods Excluded  Coffee and tea, regular and decaf                             liquids that you cannot  Plain Jell-O in any flavor                                             see through such as: Fruit ices (not with fruit pulp)                                     milk, soups, orange juice  Iced Popsicles                                    All solid food Carbonated beverages, regular and diet  Cranberry, grape and apple juices Sports drinks like Gatorade Lightly seasoned clear broth or consume(fat free) Sugar, honey  syrup  Sample Menu Breakfast                                Lunch                                     Supper Cranberry juice                    Beef broth                            Chicken broth Jell-O                                     Grape juice                           Apple juice Coffee or tea                        Jell-O                                      Popsicle                                                Coffee or tea                        Coffee or tea  _____________________________________________________________________  [facility] - Preparing for Surgery Before surgery, you can play an important role.  Because skin is not sterile, your skin needs to be as free of germs as possible.  You can reduce the number of germs on your skin by washing with CHG (chlorahexidine gluconate) soap before surgery.  CHG is an antiseptic cleaner which kills germs and bonds with the skin to continue killing germs even after washing. Please DO NOT use if you have an allergy to CHG or antibacterial soaps.  If your skin becomes reddened/irritated stop using the CHG and inform your nurse when you arrive at Short Stay. Do not shave (including legs and underarms) for at least 48 hours prior to the first CHG shower.  You may shave your face/neck. Please follow these instructions carefully:  1.  Shower with CHG Soap the night before surgery and the  morning of Surgery.  2.  If you choose to wash your hair, wash your hair first as usual with your  normal  shampoo.  3.  After you shampoo, rinse your hair and body thoroughly to remove the  shampoo.                           4.  Use CHG as you would any other liquid soap.  You can apply chg directly  to the skin and wash  Gently with a scrungie or clean washcloth.  5.  Apply the CHG Soap to your body ONLY FROM THE NECK DOWN.   Do not use on face/ open                           Wound or open sores. Avoid contact with eyes, ears mouth  and genitals (private parts).                       Wash face,  Genitals (private parts) with your normal soap.             6.  Wash thoroughly, paying special attention to the area where your surgery  will be performed.  7.  Thoroughly rinse your body with warm water from the neck down.  8.  DO NOT shower/wash with your normal soap after using and rinsing off  the CHG Soap.                9.  Pat yourself dry with a clean towel.            10.  Wear clean pajamas.            11.  Place clean sheets on your bed the night of your first shower and do not  sleep with pets. Day of Surgery : Do not apply any lotions/deodorants the morning of surgery.  Please wear clean clothes to the hospital/surgery center.  FAILURE TO FOLLOW THESE INSTRUCTIONS MAY RESULT IN THE CANCELLATION OF YOUR SURGERY PATIENT SIGNATURE_________________________________  NURSE SIGNATURE__________________________________  ________________________________________________________________________   Russell Thomas  An incentive spirometer is a tool that can help keep your lungs clear and active. This tool measures how well you are filling your lungs with each breath. Taking long deep breaths may help reverse or decrease the chance of developing breathing (pulmonary) problems (especially infection) following:  A long period of time when you are unable to move or be active. BEFORE THE PROCEDURE   If the spirometer includes an indicator to show your best effort, your nurse or respiratory therapist will set it to a desired goal.  If possible, sit up straight or lean slightly forward. Try not to slouch.  Hold the incentive spirometer in an upright position. INSTRUCTIONS FOR USE  1. Sit on the edge of your bed if possible, or sit up as far as you can in bed or on a chair. 2. Hold the incentive spirometer in an upright position. 3. Breathe out normally. 4. Place the mouthpiece in your mouth and seal your lips tightly  around it. 5. Breathe in slowly and as deeply as possible, raising the piston or the ball toward the top of the column. 6. Hold your breath for 3-5 seconds or for as long as possible. Allow the piston or ball to fall to the bottom of the column. 7. Remove the mouthpiece from your mouth and breathe out normally. 8. Rest for a few seconds and repeat Steps 1 through 7 at least 10 times every 1-2 hours when you are awake. Take your time and take a few normal breaths between deep breaths. 9. The spirometer may include an indicator to show your best effort. Use the indicator as a goal to work toward during each repetition. 10. After each set of 10 deep breaths, practice coughing to be sure your lungs are clear. If you have an incision (the cut made at the time of  surgery), support your incision when coughing by placing a pillow or rolled up towels firmly against it. Once you are able to get out of bed, walk around indoors and cough well. You may stop using the incentive spirometer when instructed by your caregiver.  RISKS AND COMPLICATIONS  Take your time so you do not get dizzy or light-headed.  If you are in pain, you may need to take or ask for pain medication before doing incentive spirometry. It is harder to take a deep breath if you are having pain. AFTER USE  Rest and breathe slowly and easily.  It can be helpful to keep track of a log of your progress. Your caregiver can provide you with a simple table to help with this. If you are using the spirometer at home, follow these instructions: [place] IF:   You are having difficultly using the spirometer.  You have trouble using the spirometer as often as instructed.  Your pain medication is not giving enough relief while using the spirometer.  You develop fever of 100.5 F (38.1 C) or higher. SEEK IMMEDIATE MEDICAL CARE IF:   You cough up bloody sputum that had not been present before.  You develop fever of 102 F (38.9 C) or  greater.  You develop worsening pain at or near the incision site. MAKE SURE YOU:   Understand these instructions.  Will watch your condition.  Will get help right away if you are not doing well or get worse. Document Released: 06/04/2006 Document Revised: 04/16/2011 Document Reviewed: 08/05/2006 ExitCare Patient Information 2014 ExitCare, [place].   ________________________________________________________________________  WHAT IS A BLOOD TRANSFUSION? Blood Transfusion Information  A transfusion is the replacement of blood or some of its parts. Blood is made up of multiple cells which provide different functions.  Red blood cells carry oxygen and are used for blood loss replacement.  White blood cells fight against infection.  Platelets control bleeding.  Plasma helps clot blood.  Other blood products are available for specialized needs, such as hemophilia or other clotting disorders. BEFORE THE TRANSFUSION  Who gives blood for transfusions?   Healthy volunteers who are fully evaluated to make sure their blood is safe. This is blood bank blood. Transfusion therapy is the safest it has ever been in the practice of medicine. Before blood is taken from a donor, a complete history is taken to make sure that person has no history of diseases nor engages in risky social behavior (examples are intravenous drug use or sexual activity with multiple partners). The donor's travel history is screened to minimize risk of transmitting infections, such as malaria. The donated blood is tested for signs of infectious diseases, such as HIV and hepatitis. The blood is then tested to be sure it is compatible with you in order to minimize the chance of a transfusion reaction. If you or a relative donates blood, this is often done in anticipation of surgery and is not appropriate for emergency situations. It takes many days to process the donated blood. RISKS AND COMPLICATIONS Although transfusion therapy  is very safe and saves many lives, the main dangers of transfusion include:   Getting an infectious disease.  Developing a transfusion reaction. This is an allergic reaction to something in the blood you were given. Every precaution is taken to prevent this. The decision to have a blood transfusion has been considered carefully by your caregiver before blood is given. Blood is not given unless the benefits outweigh the risks. AFTER THE  TRANSFUSION  Right after receiving a blood transfusion, you will usually feel much better and more energetic. This is especially true if your red blood cells have gotten low (anemic). The transfusion raises the level of the red blood cells which carry oxygen, and this usually causes an energy increase.  The nurse administering the transfusion will monitor you carefully for complications. HOME CARE INSTRUCTIONS  No special instructions are needed after a transfusion. You may find your energy is better. Speak with your caregiver about any limitations on activity for underlying diseases you may have. SEEK MEDICAL CARE IF:   Your condition is not improving after your transfusion.  You develop redness or irritation at the intravenous (IV) site. SEEK IMMEDIATE MEDICAL CARE IF:  Any of the following symptoms occur over the next 12 hours:  Shaking chills.  You have a temperature by mouth above 102 F (38.9 C), not controlled by medicine.  Chest, back, or muscle pain.  People around you feel you are not acting correctly or are confused.  Shortness of breath or difficulty breathing.  Dizziness and fainting.  You get a rash or develop hives.  You have a decrease in urine output.  Your urine turns a dark color or changes to pink, red, or brown. Any of the following symptoms occur over the next 10 days:  You have a temperature by mouth above 102 F (38.9 C), not controlled by medicine.  Shortness of breath.  Weakness after normal activity.  The white  part of the eye turns yellow (jaundice).  You have a decrease in the amount of urine or are urinating less often.  Your urine turns a dark color or changes to pink, red, or brown. Document Released: 01/20/2000 Document Revised: 04/16/2011 Document Reviewed: 09/08/2007 [facility] Patient Information 2014 [place], [place].  _______________________________________________________________________

## 2017-02-11 ENCOUNTER — Encounter (HOSPITAL_COMMUNITY)
Admission: RE | Admit: 2017-02-11 | Discharge: 2017-02-11 | Disposition: A | Discharge status: Home / Self Care | Payer: Managed Care, Other (non HMO) | Source: Ambulatory Visit | Attending: Urology | Admitting: Urology

## 2017-02-11 ENCOUNTER — Encounter (HOSPITAL_COMMUNITY): Payer: Self-pay

## 2017-02-11 ENCOUNTER — Ambulatory Visit (HOSPITAL_COMMUNITY)
Admission: RE | Admit: 2017-02-11 | Discharge: 2017-02-11 | Disposition: A | Discharge status: Home / Self Care | Payer: Managed Care, Other (non HMO) | Source: Ambulatory Visit | Attending: Urology | Admitting: Urology

## 2017-02-11 ENCOUNTER — Other Ambulatory Visit: Payer: Self-pay

## 2017-02-11 DIAGNOSIS — C61 Malignant neoplasm of prostate: Secondary | ICD-10-CM | POA: Diagnosis present

## 2017-02-11 HISTORY — DX: Prediabetes: R73.03

## 2017-02-11 HISTORY — DX: Other seasonal allergic rhinitis: J30.2

## 2017-02-11 LAB — BASIC METABOLIC PANEL
ANION GAP: 5 (ref 5–15)
BUN: 19 mg/dL (ref 6–20)
CO2: 27 mmol/L (ref 22–32)
Calcium: 9.3 mg/dL (ref 8.9–10.3)
Chloride: 108 mmol/L (ref 101–111)
Creatinine, Ser: 0.89 mg/dL (ref 0.61–1.24)
GFR calc Af Amer: >60 mL/min (ref >60)
Glucose, Bld: 90 mg/dL (ref 65–99)
POTASSIUM: 4.7 mmol/L (ref 3.5–5.1)
Sodium: 140 mmol/L (ref 135–145)

## 2017-02-11 LAB — CBC
HEMATOCRIT: 43.8 % (ref 39.0–52.0)
Hemoglobin: 15.2 g/dL (ref 13.0–17.0)
MCH: 31.5 pg (ref 26.0–34.0)
MCHC: 34.7 g/dL (ref 30.0–36.0)
MCV: 90.9 fL (ref 78.0–100.0)
Platelets: 220 10*3/uL (ref 150–400)
RBC: 4.82 MIL/uL (ref 4.22–5.81)
RDW: 13.1 % (ref 11.5–15.5)
WBC: 9.1 10*3/uL (ref 4.0–10.5)

## 2017-02-11 LAB — ABO/RH: ABO/RH(D): A POS

## 2017-02-11 MED ORDER — TECHNETIUM TC 99M MEDRONATE IV KIT
22.0000 | PACK | Freq: Once | INTRAVENOUS | Status: AC | PRN
  Administered 2017-02-11: 22 via INTRAVENOUS

## 2017-02-13 MED ORDER — VANCOMYCIN HCL 10 G IV SOLR
1500.0000 mg | Freq: Once | INTRAVENOUS | Status: AC
  Administered 2017-02-14: 1500 mg via INTRAVENOUS
  Filled 2017-02-13: qty 1500

## 2017-02-13 NOTE — H&P (Signed)
Office Visit Report     02/08/2017   --------------------------------------------------------------------------------   Russell Thomas  MRN: [id number]  PRIMARY CARE:  Delanna Ahmadi, MD  DOB: 05-12-1960, 57 year old Male  REFERRING:    SSN: [id number]  PROVIDER:  Louis Meckel, M.D.    TREATING:  Raynelle Bring, M.D.    LOCATION:  [facility] [phone]   --------------------------------------------------------------------------------   CC/HPI: CC: Prostate Cancer   Physician requesting consult: Dr. Burman Nieves  PCP: Dr. Lavone Orn   Russell Thomas is a 57 year old gentleman who was noted to have an elevated PSA of 5.68 prompting a TRUS biopsy of the prostate by Dr. Louis Meckel on 12/03/16. This demonstrated Gleason 3+4=7 adenocarcinoma of the prostate with 2 out of 12 biopsy cores positive for malignancy.   Family history: None.   Imaging studies: None.   PMH: He has a history of anxiety, GERD, sleep apnea, obesity (294 lbs) and depression. He has multiple allergies as listed. All of his allergies involve itching and hives. Specifically, with MR contrast, he has had facial flushing and a scratchy throat.  PSH: No abdominal surgeries.   TNM stage: cT2a Nx Mx (R lateral base)  PSA: 5.68  Gleason score: 3+4=7  Biopsy (12/03/16): 2/12 cores positive  Left: Benign  Right: R lateral mid (90%, 3+4=7), R lateral base (95%, 3+4=7, PNI)  Prostate volume: 33.0 cc   Nomogram  OC disease: 54%  EPE: 45%  SVI: 2%  LNI: 2%  PFS (5 year, 10 year): 88%, 80%   Urinary function: IPSS is 2.  Erectile function: SHIM score is 24.   Interval history:   Mr. Margraf returns today for further discussion pending his upcoming surgery for prostate cancer next week. He did undergo an MRI that demonstrated no evidence of metastatic disease but did raise concern for extraprostatic extension on the right side. His questions regarding his MRI results. He is scheduled for a bone scan  next week to complete his metastatic evaluation considering findings suggestive of locally advanced cancer. He also has numerous other questions regarding perioperative care.     ALLERGIES: Cephalosporins Cipro TABS Contrast Media Ready-Box MISC Penicillins Shellfish-derived Products  Sulfa Drugs    MEDICATIONS: Aleve  [place] 1 PO Daily  Diazepam 5 mg tablet 1 tablet PO Q HS PRN  Prednisone 50 mg tablet 1 tablet PO 13 hrs, 7 hrs and 1 hr before imaging study     GU PSH: Prostate Needle Biopsy - 12/03/2016 Vasectomy - 2007      [place] Notes: Hand Surgery, Oral Surgery Tooth Extraction, Tonsillectomy, Surgery Of Male Genitalia Vasectomy   NON-GU PSH: Dental Surgery Procedure - 2007 Remove Tonsils - 2007 Surgical Pathology, Gross And Microscopic Examination For Prostate Needle - 12/03/2016    GU PMH: Prostate Cancer - 12/25/2016 Elevated PSA - 12/13/2016, - 10/22/2016, The patient's PSA has been between 4 and 5-1/2 over the past 4 years. It seems to be bouncing around in this range over that time., - 04/02/2016, - 01/05/2016 Chronic prostatitis - 01/05/2016 Urinary Frequency (Stable) - 01/05/2016, Increased urinary frequency, - 2014 Dysuria, Dysuria - 2014 Inflammatory Disease Prostate, Unspec, Prostatitis - 2014 Nocturia, Nocturia - 2014 Renal calculus, Nephrolithiasis - 2014      PMH Notes:   1) Prostate cancer:   NON-GU PMH: Muscle weakness (generalized) - 01/08/2017 Anxiety, Anxiety (Symptom) - 2014 Personal history of other diseases of the nervous system and sense organs, History of sleep apnea - 2014  Personal history of other mental and behavioral disorders, History of depression - 2014 GERD    FAMILY HISTORY: 1 son - Other Atrial Fibrillation - Mother Family Health Status Number - Runs In Family Parkinson's Disease - Mother Patient's father is still living - Other patient's mother is still living - Other    Notes: Mother is 55  Father is 60   SOCIAL HISTORY:  Marital Status: Married Preferred Language: English; Ethnicity: Not Hispanic Or Latino; Race: White Current Smoking Status: Patient has never smoked.  Drinks 2 drinks per month.  Drinks 3 caffeinated drinks per day. Patient's occupation Brewing technologist.     Notes: Current Some Day Smoker, Occupation:, Marital History - Currently Married, Alcohol Use, Tobacco Use, Caffeine Use   REVIEW OF SYSTEMS:    GU Review Male:   Patient denies frequent urination, hard to postpone urination, burning/ pain with urination, get up at night to urinate, leakage of urine, stream starts and stops, trouble starting your streams, and have to strain to urinate .  Gastrointestinal (Upper):   Patient denies nausea and vomiting.  Gastrointestinal (Lower):   Patient denies diarrhea and constipation.  Constitutional:   Patient denies fever, night sweats, weight loss, and fatigue.  Skin:   Patient denies skin rash/ lesion and itching.  Eyes:   Patient denies blurred vision and double vision.  Ears/ Nose/ Throat:   Patient denies sore throat and sinus problems.  Hematologic/Lymphatic:   Patient denies swollen glands and easy bruising.  Cardiovascular:   Patient denies leg swelling and chest pains.  Respiratory:   Patient denies cough and shortness of breath.  Endocrine:   Patient denies excessive thirst.  Musculoskeletal:   Patient denies joint pain and back pain.  Neurological:   Patient denies headaches and dizziness.  Psychologic:   Patient denies depression and anxiety.   VITAL SIGNS:      02/08/2017 12:03 PM  Weight 285 lb / 129.27 kg  Height 73 in / 185.42 cm  BP 133/86 mmHg  Pulse 91 /min  BMI 37.6 kg/m   MULTI-SYSTEM PHYSICAL EXAMINATION:    Constitutional: Well-nourished. No physical deformities. Normally developed. Good grooming.  Respiratory: No labored breathing, no use of accessory muscles. Clear bilaterally.  Cardiovascular: Normal temperature, normal extremity pulses, no swelling, no varicosities.  RRR.     PAST DATA REVIEWED:  Source Of History:  Patient  Lab Test Review:   PSA  Records Review:   Pathology Reports, Previous Patient Records  Urine Test Review:   Urinalysis  X-Ray Review: MRI Prostate GSORAD: Reviewed Films.     10/15/16 03/26/16 02/10/16 03/06/06  PSA  Total PSA 5.68 ng/mL 4.91 ng/dl 4.12 ng/dl 1.74   Free PSA 0.59 ng/mL     % Free PSA 10 % PSA       03/06/06  Hormones  Testosterone, Total 1.99     PROCEDURES:          Urinalysis Dipstick Dipstick Cont'd  Color: Yellow Bilirubin: Neg  Appearance: Clear Ketones: Neg  Specific Gravity: 1.020 Blood: Neg  pH: 6.0 Protein: Neg  Glucose: Neg Urobilinogen: 0.2    Nitrites: Neg    Leukocyte Esterase: Neg    ASSESSMENT:      ICD-10 Details  1 GU:   Prostate Cancer - C61    PLAN:           Schedule Return Visit/Planned Activity: Keep Scheduled Appointment          Document Letter(s):  Created for Patient: Clinical  Summary         Notes:   1. Probable locally advanced prostate cancer: We reviewed his MRI results and I have recommended that he proceed with a unilateral left-sided nerve sparing robotic prostatectomy and bilateral pelvic lymphadenectomy considering his MRI findings. His bone scan is scheduled for Monday and I will notify him of the results. We did discuss the implications on his erectile function with regard to a unilateral nerve sparing procedure. He has appropriate expectations.   He had numerous questions regarding his perioperative care as well as his cancer prognosis and functional outcomes. These were answered to his stated satisfaction and completely. He feels prepared to proceed pending his bone scan.   He will therefore proceed with a unilateral left nerve sparing robot-assisted laparoscopic radical prostatectomy and bilateral pelvic lymphadenectomy next week.   Mr. Gruenberg does have multiple allergies and will be administered vancomycin perioperatively for prophylaxis. I also  will likely provide him a prescription for doxycycline around the time of catheter removal considering his allergies to sulfa antibiotics, fluoroquinolones, and cephalosporins.   Cc: Dr. Lavone Orn    E & M CODE: I spent at least 52 minutes face to face with the patient, more than 50% of that time was spent on counseling and/or coordinating care.     * Signed by Raynelle Bring, M.D. on 02/08/17 at 7:25 PM (EST)*

## 2017-02-14 ENCOUNTER — Ambulatory Visit (HOSPITAL_COMMUNITY): Payer: Managed Care, Other (non HMO) | Admitting: Anesthesiology

## 2017-02-14 ENCOUNTER — Encounter (HOSPITAL_COMMUNITY)
Admission: RE | Disposition: A | Discharge status: Home / Self Care | Payer: Self-pay | Source: Ambulatory Visit | Attending: Urology

## 2017-02-14 ENCOUNTER — Encounter (HOSPITAL_COMMUNITY): Payer: Self-pay | Admitting: *Deleted

## 2017-02-14 ENCOUNTER — Other Ambulatory Visit: Payer: Self-pay

## 2017-02-14 ENCOUNTER — Observation Stay (HOSPITAL_COMMUNITY)
Admission: RE | Admit: 2017-02-14 | Discharge: 2017-02-15 | Disposition: A | Discharge status: Home / Self Care | Payer: Managed Care, Other (non HMO) | Source: Ambulatory Visit | Attending: Urology | Admitting: Urology

## 2017-02-14 DIAGNOSIS — K219 Gastro-esophageal reflux disease without esophagitis: Secondary | ICD-10-CM | POA: Insufficient documentation

## 2017-02-14 DIAGNOSIS — Z87891 Personal history of nicotine dependence: Secondary | ICD-10-CM | POA: Insufficient documentation

## 2017-02-14 DIAGNOSIS — F419 Anxiety disorder, unspecified: Secondary | ICD-10-CM | POA: Diagnosis not present

## 2017-02-14 DIAGNOSIS — Z882 Allergy status to sulfonamides status: Secondary | ICD-10-CM | POA: Insufficient documentation

## 2017-02-14 DIAGNOSIS — Z6839 Body mass index (BMI) 39.0-39.9, adult: Secondary | ICD-10-CM | POA: Diagnosis not present

## 2017-02-14 DIAGNOSIS — C61 Malignant neoplasm of prostate: Secondary | ICD-10-CM | POA: Diagnosis present

## 2017-02-14 DIAGNOSIS — Z881 Allergy status to other antibiotic agents status: Secondary | ICD-10-CM | POA: Diagnosis not present

## 2017-02-14 DIAGNOSIS — E669 Obesity, unspecified: Secondary | ICD-10-CM | POA: Diagnosis not present

## 2017-02-14 DIAGNOSIS — Z91013 Allergy to seafood: Secondary | ICD-10-CM | POA: Diagnosis not present

## 2017-02-14 DIAGNOSIS — Z91041 Radiographic dye allergy status: Secondary | ICD-10-CM | POA: Diagnosis not present

## 2017-02-14 DIAGNOSIS — F329 Major depressive disorder, single episode, unspecified: Secondary | ICD-10-CM | POA: Insufficient documentation

## 2017-02-14 DIAGNOSIS — Z880 Allergy status to penicillin: Secondary | ICD-10-CM | POA: Diagnosis not present

## 2017-02-14 DIAGNOSIS — G4730 Sleep apnea, unspecified: Secondary | ICD-10-CM | POA: Insufficient documentation

## 2017-02-14 DIAGNOSIS — Z79899 Other long term (current) drug therapy: Secondary | ICD-10-CM | POA: Insufficient documentation

## 2017-02-14 DIAGNOSIS — Z8546 Personal history of malignant neoplasm of prostate: Secondary | ICD-10-CM | POA: Diagnosis present

## 2017-02-14 HISTORY — PX: ROBOT ASSISTED LAPAROSCOPIC RADICAL PROSTATECTOMY: SHX5141

## 2017-02-14 HISTORY — PX: LYMPHADENECTOMY: SHX5960

## 2017-02-14 LAB — HEMOGLOBIN AND HEMATOCRIT, BLOOD
HCT: 40.4 % (ref 39.0–52.0)
HEMOGLOBIN: 14 g/dL (ref 13.0–17.0)

## 2017-02-14 LAB — TYPE AND SCREEN
ABO/RH(D): A POS
ANTIBODY SCREEN: NEGATIVE

## 2017-02-14 SURGERY — XI ROBOTIC ASSISTED LAPAROSCOPIC RADICAL PROSTATECTOMY LEVEL 3
Anesthesia: General

## 2017-02-14 MED ORDER — VANCOMYCIN HCL IN DEXTROSE 1-5 GM/200ML-% IV SOLN
1000.0000 mg | Freq: Two times a day (BID) | INTRAVENOUS | Status: AC
  Administered 2017-02-14: 1000 mg via INTRAVENOUS
  Filled 2017-02-14: qty 200

## 2017-02-14 MED ORDER — KCL IN DEXTROSE-NACL 20-5-0.45 MEQ/L-%-% IV SOLN
INTRAVENOUS | Status: DC
  Administered 2017-02-14: 150 mL via INTRAVENOUS
  Filled 2017-02-14 (×2): qty 1000

## 2017-02-14 MED ORDER — ONDANSETRON HCL 4 MG/2ML IJ SOLN: 4.0000 mg | Freq: Once | INTRAMUSCULAR | Status: DC | PRN

## 2017-02-14 MED ORDER — STERILE WATER FOR INJECTION IJ SOLN
INTRAMUSCULAR | Status: AC
  Filled 2017-02-14: qty 10

## 2017-02-14 MED ORDER — PROPOFOL 10 MG/ML IV BOLUS
INTRAVENOUS | Status: AC
Start: 2017-02-14 — End: 2017-02-14
  Filled 2017-02-14: qty 20

## 2017-02-14 MED ORDER — HYDROMORPHONE HCL 2 MG/ML IJ SOLN
INTRAMUSCULAR | Status: AC
  Filled 2017-02-14: qty 1

## 2017-02-14 MED ORDER — ONDANSETRON HCL 4 MG/2ML IJ SOLN
INTRAMUSCULAR | Status: DC | PRN
  Administered 2017-02-14: 4 mg via INTRAVENOUS

## 2017-02-14 MED ORDER — ROCURONIUM BROMIDE 50 MG/5ML IV SOSY
PREFILLED_SYRINGE | INTRAVENOUS | Status: AC
  Filled 2017-02-14: qty 5

## 2017-02-14 MED ORDER — SUGAMMADEX SODIUM 200 MG/2ML IV SOLN
INTRAVENOUS | Status: DC | PRN
  Administered 2017-02-14: 300 mg via INTRAVENOUS

## 2017-02-14 MED ORDER — DOXYCYCLINE HYCLATE 100 MG PO CAPS: 100.0000 mg | ORAL_CAPSULE | Freq: Two times a day (BID) | ORAL | 0 refills | Status: AC

## 2017-02-14 MED ORDER — ONDANSETRON HCL 4 MG/2ML IJ SOLN
4.0000 mg | INTRAMUSCULAR | Status: DC | PRN
  Administered 2017-02-14: 4 mg via INTRAVENOUS
  Filled 2017-02-14: qty 2

## 2017-02-14 MED ORDER — ONDANSETRON HCL 4 MG/2ML IJ SOLN
INTRAMUSCULAR | Status: AC
Start: 2017-02-14 — End: 2017-02-14
  Filled 2017-02-14: qty 2

## 2017-02-14 MED ORDER — HYDROMORPHONE HCL 1 MG/ML IJ SOLN
0.2500 mg | INTRAMUSCULAR | Status: DC | PRN
  Administered 2017-02-14 (×2): 0.25 mg via INTRAVENOUS
  Administered 2017-02-14: 0.5 mg via INTRAVENOUS

## 2017-02-14 MED ORDER — LIDOCAINE 2% (20 MG/ML) 5 ML SYRINGE
INTRAMUSCULAR | Status: AC
Start: 2017-02-14 — End: 2017-02-14
  Filled 2017-02-14: qty 5

## 2017-02-14 MED ORDER — ENOXAPARIN SODIUM 40 MG/0.4ML ~~LOC~~ SOLN
40.0000 mg | SUBCUTANEOUS | Status: DC
  Administered 2017-02-15: 40 mg via SUBCUTANEOUS
  Filled 2017-02-14: qty 0.4

## 2017-02-14 MED ORDER — SODIUM CHLORIDE 0.9 % IJ SOLN
INTRAMUSCULAR | Status: AC
Start: 2017-02-14 — End: 2017-02-14
  Filled 2017-02-14: qty 10

## 2017-02-14 MED ORDER — KETOROLAC TROMETHAMINE 15 MG/ML IJ SOLN
15.0000 mg | Freq: Four times a day (QID) | INTRAMUSCULAR | Status: DC
  Administered 2017-02-14 – 2017-02-15 (×4): 15 mg via INTRAVENOUS
  Filled 2017-02-14 (×4): qty 1

## 2017-02-14 MED ORDER — DIPHENHYDRAMINE HCL 12.5 MG/5ML PO ELIX: 12.5000 mg | ORAL_SOLUTION | Freq: Four times a day (QID) | ORAL | Status: DC | PRN

## 2017-02-14 MED ORDER — MIDAZOLAM HCL 2 MG/2ML IJ SOLN
INTRAMUSCULAR | Status: AC
  Filled 2017-02-14: qty 2

## 2017-02-14 MED ORDER — ZOLPIDEM TARTRATE 5 MG PO TABS
5.0000 mg | ORAL_TABLET | Freq: Every evening | ORAL | Status: DC | PRN
  Administered 2017-02-14: 5 mg via ORAL
  Filled 2017-02-14: qty 1

## 2017-02-14 MED ORDER — DEXAMETHASONE SODIUM PHOSPHATE 10 MG/ML IJ SOLN
INTRAMUSCULAR | Status: AC
  Filled 2017-02-14: qty 1

## 2017-02-14 MED ORDER — STERILE WATER FOR IRRIGATION IR SOLN
Status: DC | PRN
  Administered 2017-02-14: 1000 mL

## 2017-02-14 MED ORDER — FLEET ENEMA 7-19 GM/118ML RE ENEM
1.0000 | ENEMA | Freq: Once | RECTAL | Status: DC
  Filled 2017-02-14: qty 1

## 2017-02-14 MED ORDER — NAPHAZOLINE-PHENIRAMINE 0.025-0.3 % OP SOLN: 1.0000 [drp] | Freq: Every day | OPHTHALMIC | Status: DC | PRN

## 2017-02-14 MED ORDER — LACTATED RINGERS IV SOLN
INTRAVENOUS | Status: DC | PRN
  Administered 2017-02-14: 1 mL

## 2017-02-14 MED ORDER — BUPIVACAINE-EPINEPHRINE 0.25% -1:200000 IJ SOLN
INTRAMUSCULAR | Status: AC
  Filled 2017-02-14: qty 1

## 2017-02-14 MED ORDER — DEXAMETHASONE SODIUM PHOSPHATE 10 MG/ML IJ SOLN
INTRAMUSCULAR | Status: DC | PRN
  Administered 2017-02-14: 10 mg via INTRAVENOUS

## 2017-02-14 MED ORDER — LACTATED RINGERS IV SOLN
INTRAVENOUS | Status: DC
  Administered 2017-02-14 (×3): via INTRAVENOUS

## 2017-02-14 MED ORDER — MENTHOL 3 MG MT LOZG
1.0000 | LOZENGE | OROMUCOSAL | Status: DC | PRN
  Filled 2017-02-14: qty 9

## 2017-02-14 MED ORDER — MORPHINE SULFATE (PF) 4 MG/ML IV SOLN
2.0000 mg | INTRAVENOUS | Status: DC | PRN
  Administered 2017-02-14: 2 mg via INTRAVENOUS
  Administered 2017-02-14: 4 mg via INTRAVENOUS
  Filled 2017-02-14 (×2): qty 1

## 2017-02-14 MED ORDER — LACTATED RINGERS IR SOLN
Status: DC | PRN
  Administered 2017-02-14: 1000 mL

## 2017-02-14 MED ORDER — SUGAMMADEX SODIUM 500 MG/5ML IV SOLN
INTRAVENOUS | Status: AC
  Filled 2017-02-14: qty 5

## 2017-02-14 MED ORDER — BUPIVACAINE-EPINEPHRINE 0.25% -1:200000 IJ SOLN
INTRAMUSCULAR | Status: DC | PRN
  Administered 2017-02-14: 30 mL

## 2017-02-14 MED ORDER — ACETAMINOPHEN 325 MG PO TABS
650.0000 mg | ORAL_TABLET | ORAL | Status: DC | PRN
  Administered 2017-02-14: 650 mg via ORAL
  Filled 2017-02-14: qty 2

## 2017-02-14 MED ORDER — PROPOFOL 10 MG/ML IV BOLUS
INTRAVENOUS | Status: DC | PRN
  Administered 2017-02-14: 200 mg via INTRAVENOUS

## 2017-02-14 MED ORDER — HEPARIN SODIUM (PORCINE) 1000 UNIT/ML IJ SOLN
INTRAMUSCULAR | Status: AC
Start: 2017-02-14 — End: 2017-02-14
  Filled 2017-02-14: qty 1

## 2017-02-14 MED ORDER — CHLORHEXIDINE GLUCONATE 0.12 % MT SOLN
15.0000 mL | Freq: Two times a day (BID) | OROMUCOSAL | Status: DC
  Administered 2017-02-14 – 2017-02-15 (×2): 15 mL via OROMUCOSAL
  Filled 2017-02-14 (×2): qty 15

## 2017-02-14 MED ORDER — DOCUSATE SODIUM 100 MG PO CAPS
100.0000 mg | ORAL_CAPSULE | Freq: Two times a day (BID) | ORAL | Status: DC
  Administered 2017-02-14 – 2017-02-15 (×2): 100 mg via ORAL
  Filled 2017-02-14 (×2): qty 1

## 2017-02-14 MED ORDER — MEPERIDINE HCL 50 MG/ML IJ SOLN: 6.2500 mg | INTRAMUSCULAR | Status: DC | PRN

## 2017-02-14 MED ORDER — ROCURONIUM BROMIDE 50 MG/5ML IV SOSY
PREFILLED_SYRINGE | INTRAVENOUS | Status: AC
Start: 2017-02-14 — End: 2017-02-14
  Filled 2017-02-14: qty 5

## 2017-02-14 MED ORDER — HYDRALAZINE HCL 20 MG/ML IJ SOLN
INTRAMUSCULAR | Status: AC
  Filled 2017-02-14: qty 1

## 2017-02-14 MED ORDER — HYDROMORPHONE HCL 1 MG/ML IJ SOLN
INTRAMUSCULAR | Status: DC | PRN
  Administered 2017-02-14 (×5): .4 mg via INTRAVENOUS

## 2017-02-14 MED ORDER — SODIUM CHLORIDE 0.9 % IJ SOLN
INTRAMUSCULAR | Status: AC
  Filled 2017-02-14: qty 10

## 2017-02-14 MED ORDER — HYDROCODONE-ACETAMINOPHEN 5-325 MG PO TABS: 1.0000 | ORAL_TABLET | Freq: Four times a day (QID) | ORAL | 0 refills | Status: DC | PRN

## 2017-02-14 MED ORDER — MAGNESIUM CITRATE PO SOLN
1.0000 | Freq: Once | ORAL | Status: DC
  Filled 2017-02-14: qty 296

## 2017-02-14 MED ORDER — DIPHENHYDRAMINE HCL 50 MG/ML IJ SOLN: 12.5000 mg | Freq: Four times a day (QID) | INTRAMUSCULAR | Status: DC | PRN

## 2017-02-14 MED ORDER — SUFENTANIL CITRATE 50 MCG/ML IV SOLN
INTRAVENOUS | Status: DC | PRN
  Administered 2017-02-14: 10 ug via INTRAVENOUS
  Administered 2017-02-14: 20 ug via INTRAVENOUS
  Administered 2017-02-14 (×2): 10 ug via INTRAVENOUS

## 2017-02-14 MED ORDER — SUCCINYLCHOLINE CHLORIDE 200 MG/10ML IV SOSY
PREFILLED_SYRINGE | INTRAVENOUS | Status: AC
  Filled 2017-02-14: qty 10

## 2017-02-14 MED ORDER — ORAL CARE MOUTH RINSE: 15.0000 mL | Freq: Two times a day (BID) | OROMUCOSAL | Status: DC

## 2017-02-14 MED ORDER — SUFENTANIL CITRATE 50 MCG/ML IV SOLN
INTRAVENOUS | Status: AC
Start: 2017-02-14 — End: 2017-02-14
  Filled 2017-02-14: qty 1

## 2017-02-14 MED ORDER — LORATADINE 10 MG PO TABS: 10.0000 mg | ORAL_TABLET | Freq: Every day | ORAL | Status: DC | PRN

## 2017-02-14 MED ORDER — MIDAZOLAM HCL 2 MG/2ML IJ SOLN
INTRAMUSCULAR | Status: DC | PRN
  Administered 2017-02-14: 2 mg via INTRAVENOUS

## 2017-02-14 MED ORDER — LIDOCAINE 2% (20 MG/ML) 5 ML SYRINGE
INTRAMUSCULAR | Status: DC | PRN
  Administered 2017-02-14: 100 mg via INTRAVENOUS

## 2017-02-14 MED ORDER — HYDROMORPHONE HCL 1 MG/ML IJ SOLN
INTRAMUSCULAR | Status: AC
  Administered 2017-02-14: 0.25 mg via INTRAVENOUS
  Filled 2017-02-14: qty 1

## 2017-02-14 MED ORDER — SODIUM CHLORIDE 0.9 % IR SOLN
Status: DC | PRN
  Administered 2017-02-14: 1000 mL via INTRAVESICAL

## 2017-02-14 MED ORDER — SODIUM CHLORIDE 0.9 % IV BOLUS (SEPSIS)
1000.0000 mL | Freq: Once | INTRAVENOUS | Status: AC
  Administered 2017-02-14: 1000 mL via INTRAVENOUS

## 2017-02-14 MED ORDER — ROCURONIUM BROMIDE 10 MG/ML (PF) SYRINGE
PREFILLED_SYRINGE | INTRAVENOUS | Status: DC | PRN
  Administered 2017-02-14: 10 mg via INTRAVENOUS
  Administered 2017-02-14: 20 mg via INTRAVENOUS
  Administered 2017-02-14: 50 mg via INTRAVENOUS
  Administered 2017-02-14: 20 mg via INTRAVENOUS
  Administered 2017-02-14: 10 mg via INTRAVENOUS

## 2017-02-14 SURGICAL SUPPLY — 50 items
ADH SKN CLS APL DERMABOND .7 (GAUZE/BANDAGES/DRESSINGS) ×2
APPLICATOR COTTON TIP 6IN STRL (MISCELLANEOUS) ×3 IMPLANT
CATH FOLEY 2WAY SLVR 18FR 30CC (CATHETERS) ×3 IMPLANT
CATH ROBINSON RED A/P 16FR (CATHETERS) ×3 IMPLANT
CATH ROBINSON RED A/P 8FR (CATHETERS) ×3 IMPLANT
CATH TIEMANN FOLEY 18FR 5CC (CATHETERS) ×3 IMPLANT
CHLORAPREP W/TINT 26ML (MISCELLANEOUS) ×3 IMPLANT
CLIP VESOLOCK LG 6/CT PURPLE (CLIP) ×6 IMPLANT
COVER SURGICAL LIGHT HANDLE (MISCELLANEOUS) ×3 IMPLANT
COVER TIP SHEARS 8 DVNC (MISCELLANEOUS) ×2 IMPLANT
COVER TIP SHEARS 8MM DA VINCI (MISCELLANEOUS) ×1
CUTTER ECHEON FLEX ENDO 45 340 (ENDOMECHANICALS) ×3 IMPLANT
DECANTER SPIKE VIAL GLASS SM (MISCELLANEOUS) ×3 IMPLANT
DERMABOND ADVANCED (GAUZE/BANDAGES/DRESSINGS) ×1
DERMABOND ADVANCED .7 DNX12 (GAUZE/BANDAGES/DRESSINGS) ×2 IMPLANT
DRAPE ARM DVNC X/XI (DISPOSABLE) ×8 IMPLANT
DRAPE COLUMN DVNC XI (DISPOSABLE) ×2 IMPLANT
DRAPE DA VINCI XI ARM (DISPOSABLE) ×4
DRAPE DA VINCI XI COLUMN (DISPOSABLE) ×1
DRAPE SURG IRRIG POUCH 19X23 (DRAPES) ×3 IMPLANT
DRSG TEGADERM 4X4.75 (GAUZE/BANDAGES/DRESSINGS) ×3 IMPLANT
ELECT REM PT RETURN 15FT ADLT (MISCELLANEOUS) ×3 IMPLANT
GLOVE BIO SURGEON STRL SZ 6.5 (GLOVE) ×9 IMPLANT
GLOVE BIOGEL M STRL SZ7.5 (GLOVE) ×6 IMPLANT
GOWN STRL REUS W/TWL LRG LVL3 (GOWN DISPOSABLE) ×9 IMPLANT
HOLDER FOLEY CATH W/STRAP (MISCELLANEOUS) ×3 IMPLANT
IRRIG SUCT STRYKERFLOW 2 WTIP (MISCELLANEOUS) ×3
IRRIGATION SUCT STRKRFLW 2 WTP (MISCELLANEOUS) ×2 IMPLANT
NDL SAFETY ECLIPSE 18X1.5 (NEEDLE) ×2 IMPLANT
NEEDLE HYPO 18GX1.5 SHARP (NEEDLE) ×1
PACK ROBOT UROLOGY CUSTOM (CUSTOM PROCEDURE TRAY) ×3 IMPLANT
SEAL CANN UNIV 5-8 DVNC XI (MISCELLANEOUS) ×8 IMPLANT
SEAL XI 5MM-8MM UNIVERSAL (MISCELLANEOUS) ×4
SOLUTION ELECTROLUBE (MISCELLANEOUS) ×3 IMPLANT
STAPLE RELOAD 45 GRN (STAPLE) ×2 IMPLANT
STAPLE RELOAD 45MM GREEN (STAPLE) ×1
SUT ETHILON 3 0 PS 1 (SUTURE) ×3 IMPLANT
SUT MNCRL 3 0 RB1 (SUTURE) ×2 IMPLANT
SUT MNCRL 3 0 VIOLET RB1 (SUTURE) ×4 IMPLANT
SUT MNCRL AB 4-0 PS2 18 (SUTURE) ×6 IMPLANT
SUT MONOCRYL 3 0 RB1 (SUTURE) ×3
SUT VIC AB 0 CT1 27 (SUTURE) ×2
SUT VIC AB 0 CT1 27XBRD ANTBC (SUTURE) ×2 IMPLANT
SUT VIC AB 0 UR5 27 (SUTURE) ×3 IMPLANT
SUT VIC AB 2-0 SH 27 (SUTURE) ×6
SUT VIC AB 2-0 SH 27X BRD (SUTURE) ×4 IMPLANT
SUT VICRYL 0 UR6 27IN ABS (SUTURE) ×6 IMPLANT
SYR 27GX1/2 1ML LL SAFETY (SYRINGE) ×3 IMPLANT
TOWEL OR 17X26 10 PK STRL BLUE (TOWEL DISPOSABLE) ×3 IMPLANT
TOWEL OR NON WOVEN STRL DISP B (DISPOSABLE) ×3 IMPLANT

## 2017-02-14 NOTE — Transfer of Care (Signed)
Immediate Anesthesia Transfer of Care Note  Patient: Russell Thomas  Procedure(s) Performed: XI ROBOTIC ASSISTED LAPAROSCOPIC RADICAL PROSTATECTOMY LEVEL 3 (N/A ) LYMPHADENECTOMY/PELVIC (Bilateral )  Patient Location: PACU  Anesthesia Type:General  Level of Consciousness: awake and alert   Airway & Oxygen Therapy: Patient Spontanous Breathing and Patient connected to face mask oxygen  Post-op Assessment: Report given to RN and Post -op Vital signs reviewed and stable  Post vital signs: Reviewed and stable  Last Vitals:  Vitals:   02/14/17 0930 02/14/17 1555  BP: (!) 142/95   Pulse: 95   Resp: 18   Temp: 36.8 C (P) 36.8 C  SpO2: 98%     Last Pain:  Vitals:   02/14/17 0930  TempSrc: Oral         Complications: No apparent anesthesia complications

## 2017-02-14 NOTE — Anesthesia Preprocedure Evaluation (Signed)
Anesthesia Evaluation  Patient identified by MRN, date of birth, ID band Patient awake    Reviewed: Allergy & Precautions, NPO status , Patient's Chart, lab work & pertinent test results  Airway Mallampati: III  TM Distance: >3 FB Neck ROM: Full    Dental   Pulmonary sleep apnea , former smoker,    Pulmonary exam normal        Cardiovascular Normal cardiovascular exam     Neuro/Psych Anxiety    GI/Hepatic GERD  Medicated and Controlled,  Endo/Other    Renal/GU      Musculoskeletal   Abdominal   Peds  Hematology   Anesthesia Other Findings   Reproductive/Obstetrics                             Anesthesia Physical Anesthesia Plan  ASA: III  Anesthesia Plan: General   Post-op Pain Management:    Induction: Intravenous  PONV Risk Score and Plan: 2 and Ondansetron, Dexamethasone and Midazolam  Airway Management Planned: Oral ETT  Additional Equipment:   Intra-op Plan:   Post-operative Plan: Extubation in OR  Informed Consent: I have reviewed the patients History and Physical, chart, labs and discussed the procedure including the risks, benefits and alternatives for the proposed anesthesia with the patient or authorized representative who has indicated his/her understanding and acceptance.     Plan Discussed with: CRNA and Surgeon  Anesthesia Plan Comments:         Anesthesia Quick Evaluation

## 2017-02-14 NOTE — Progress Notes (Signed)
Patient ID: Russell Thomas, male   DOB: [birth year], 57 y.o.   MRN: [id number]  Post-op note  Subjective: The patient is doing well.  No complaints.  Objective: Vital signs in last 24 hours: Temp:  [98.3 F (36.8 C)-98.6 F (37 C)] 98.6 F (37 C) (01/10 1712) Pulse Rate:  [84-96] 86 (01/10 1712) Resp:  [12-18] 16 (01/10 1712) BP: (132-142)/(79-95) 138/80 (01/10 1712) SpO2:  [97 %-100 %] 98 % (01/10 1712) Weight:  [134.7 kg (297 lb)] 134.7 kg (297 lb) (01/10 0942)  Intake/Output from previous day: No intake/output data recorded. Intake/Output this shift: Total I/O In: 3650 [I.V.:2650; IV Piggyback:1000] Out: 530 [Urine:200; Drains:80; Blood:250]  Physical Exam:  General: Alert and oriented. Abdomen: Soft, Nondistended. Incisions: Clean and dry. GU: Urine clear.  Lab Results: Recent Labs    02/14/17 1611  HGB 14.0  HCT 40.4    Assessment/Plan: POD#0   1) Continue to monitor, ambulate, IS   Pryor Curia. MD   LOS: 0 days   Russell Thomas,Russell Thomas 02/14/2017, 6:02 PM

## 2017-02-14 NOTE — Interval H&P Note (Signed)
History and Physical Interval Note:  02/14/2017 11:09 AM  Russell Thomas  has presented today for surgery, with the diagnosis of PROSTATE CANCER  The various methods of treatment have been discussed with the patient and family. After consideration of risks, benefits and other options for treatment, the patient has consented to  Procedure(s): XI ROBOTIC ASSISTED LAPAROSCOPIC RADICAL PROSTATECTOMY LEVEL 3 (N/A) LYMPHADENECTOMY/PELVIC (Bilateral) as a surgical intervention .  The patient's history has been reviewed, patient examined, no change in status, stable for surgery.  I have reviewed the patient's chart and labs.  Questions were answered to the patient's satisfaction.     Forest Redwine,LES

## 2017-02-14 NOTE — Anesthesia Procedure Notes (Signed)
Procedure Name: Intubation Date/Time: 02/14/2017 12:06 PM Performed by: Sharlette Dense, CRNA Patient Re-evaluated:Patient Re-evaluated prior to induction Oxygen Delivery Method: Circle system utilized Preoxygenation: Pre-oxygenation with 100% oxygen Induction Type: IV induction Ventilation: Mask ventilation without difficulty and Oral airway inserted - appropriate to patient size Laryngoscope Size: Miller and 3 Grade View: Grade II Tube type: Oral Tube size: 8.0 mm Number of attempts: 1 Placement Confirmation: ETT inserted through vocal cords under direct vision,  positive ETCO2 and breath sounds checked- equal and bilateral Secured at: 23 cm Tube secured with: Tape Dental Injury: Teeth and Oropharynx as per pre-operative assessment

## 2017-02-14 NOTE — Discharge Instructions (Signed)

## 2017-02-14 NOTE — Op Note (Signed)
Preoperative diagnosis: Clinically localized adenocarcinoma of the prostate (clinical stage T3a N0 M0)  Postoperative diagnosis: Clinically localized adenocarcinoma of the prostate (clinical stage T3a N0 M0)  Procedure:  1. Robotic assisted laparoscopic radical prostatectomy (left nerve sparing) 2. Bilateral robotic assisted laparoscopic pelvic lymphadenectomy  Surgeon: Pryor Curia. M.D.  Assistant(s): Debbrah Alar, PA-C  An assistant was required for this surgical procedure.  The duties of the assistant included but were not limited to suctioning, passing suture, camera manipulation, retraction. This procedure would not be able to be performed without an Environmental consultant.  Resident: Dr. Jonna Clark  Anesthesia: General  Complications: None  EBL: 250 mL  IVF:  2400 mL crystalloid  Specimens: 1. Prostate and seminal vesicles 2. Right pelvic lymph nodes 3. Left pelvic lymph nodes  Disposition of specimens: Pathology  Drains: 1. 20 Fr coude catheter 2. # 19 Blake pelvic drain  Indication: Russell Thomas is a 57 y.o. patient with clinically localized prostate cancer.  After a thorough review of the management options for treatment of prostate cancer, he elected to proceed with surgical therapy and the above procedure(s).  We have discussed the potential benefits and risks of the procedure, side effects of the proposed treatment, the likelihood of the patient achieving the goals of the procedure, and any potential problems that might occur during the procedure or recuperation. Informed consent has been obtained.  Description of procedure:  The patient was taken to the operating room and a general anesthetic was administered. He was given preoperative antibiotics, placed in the dorsal lithotomy position, and prepped and draped in the usual sterile fashion. Next a preoperative timeout was performed. A urethral catheter was placed into the bladder and a site was selected near  the umbilicus for placement of the camera port. This was placed using a standard open Hassan technique which allowed entry into the peritoneal cavity under direct vision and without difficulty. A 12 mm port was placed and a pneumoperitoneum established. The camera was then used to inspect the abdomen and there was no evidence of any intra-abdominal injuries or other abnormalities. The remaining abdominal ports were then placed. 8 mm robotic ports were placed in the right lower quadrant, left lower quadrant, and far left lateral abdominal wall. A 5 mm port was placed in the right upper quadrant and a 12 mm port was placed in the right lateral abdominal wall for laparoscopic assistance. All ports were placed under direct vision without difficulty. The surgical cart was then docked.   Utilizing the cautery scissors, the bladder was reflected posteriorly allowing entry into the space of Retzius and identification of the endopelvic fascia and prostate. The periprostatic fat was then removed from the prostate allowing full exposure of the endopelvic fascia. The endopelvic fascia was then incised from the apex back to the base of the prostate bilaterally and the underlying levator muscle fibers were swept laterally off the prostate thereby isolating the dorsal venous complex. The dorsal vein was then stapled and divided with a 45 mm Flex Echelon stapler. Attention then turned to the bladder neck which was divided anteriorly thereby allowing entry into the bladder and exposure of the urethral catheter. The catheter balloon was deflated and the catheter was brought into the operative field and used to retract the prostate anteriorly. The posterior bladder neck was then examined and was divided allowing further dissection between the bladder and prostate posteriorly until the vasa deferentia and seminal vessels were identified. The vasa deferentia were isolated, divided, and  lifted anteriorly. The seminal vesicles were  dissected down to their tips with care to control the seminal vascular arterial blood supply. These structures were then lifted anteriorly and the space between Denonvillier's fascia and the anterior rectum was developed with a combination of sharp and blunt dissection. This isolated the vascular pedicles of the prostate.  The lateral prostatic fascia on the left side of the prostate was then sharply incised allowing release of the neurovascular bundle. The vascular pedicle of the prostate on the left side was then ligated with Weck clips between the prostate and neurovascular bundle and divided with sharp cold scissor dissection resulting in neurovascular bundle preservation. On the right side, a wide non nerve sparing dissection was performed with Weck clips used to ligate the vascular pedicle of the prostate. The neurovascular bundle on the left side was then separated off the apex of the prostate and urethra.   The urethra was then sharply transected allowing the prostate specimen to be disarticulated. The pelvis was copiously irrigated and hemostasis was ensured. There was no evidence for rectal injury.  Attention then turned to the right pelvic sidewall. The fibrofatty tissue between the external iliac vein, confluence of the iliac vessels, hypogastric artery, and Cooper's ligament was dissected free from the pelvic sidewall with care to preserve the obturator nerve. Weck clips were used for lymphostasis and hemostasis. An identical procedure was performed on the contralateral side and the lymphatic packets were removed for permanent pathologic analysis.  Attention then turned to the urethral anastomosis. A 2-0 Vicryl slip knot was placed between Denonvillier's fascia, the posterior bladder neck, and the posterior urethra to reapproximate these structures. A double-armed 3-0 Monocryl suture was then used to perform a 360 running tension-free anastomosis between the bladder neck and urethra. A new  urethral catheter was then placed into the bladder and irrigated. There were no blood clots within the bladder and the anastomosis appeared to be watertight. A #19 Blake drain was then brought through the left lateral 8 mm port site and positioned appropriately within the pelvis. It was secured to the skin with a nylon suture. The surgical cart was then undocked. The right lateral 12 mm port site was closed at the fascial level with a 0 Vicryl suture placed laparoscopically. All remaining ports were then removed under direct vision. The prostate specimen was removed intact within the Endopouch retrieval bag via the periumbilical camera port site. This fascial opening was closed with two running 0 Vicryl sutures. 0.25% Marcaine was then injected into all port sites and all incisions were reapproximated at the skin level with 4-0 Monocryl subcuticular sutures and Dermabond. The patient appeared to tolerate the procedure well and without complications. The patient was able to be extubated and transferred to the recovery unit in satisfactory condition.   Pryor Curia MD

## 2017-02-15 ENCOUNTER — Encounter (HOSPITAL_COMMUNITY): Payer: Self-pay | Admitting: Urology

## 2017-02-15 ENCOUNTER — Other Ambulatory Visit: Payer: Self-pay

## 2017-02-15 DIAGNOSIS — C61 Malignant neoplasm of prostate: Secondary | ICD-10-CM | POA: Diagnosis not present

## 2017-02-15 LAB — HEMOGLOBIN AND HEMATOCRIT, BLOOD
HCT: 36 % — ABNORMAL LOW (ref 39.0–52.0)
Hemoglobin: 12.3 g/dL — ABNORMAL LOW (ref 13.0–17.0)

## 2017-02-15 MED ORDER — HYDROCODONE-ACETAMINOPHEN 5-325 MG PO TABS
1.0000 | ORAL_TABLET | Freq: Four times a day (QID) | ORAL | Status: DC | PRN
Start: 2017-02-15 — End: 2017-02-15
  Administered 2017-02-15: 2 via ORAL
  Filled 2017-02-15: qty 2

## 2017-02-15 MED ORDER — BISACODYL 10 MG RE SUPP
10.0000 mg | Freq: Once | RECTAL | Status: AC
  Administered 2017-02-15: 10 mg via RECTAL
  Filled 2017-02-15: qty 1

## 2017-02-15 NOTE — Discharge Summary (Signed)
Date of admission: 02/14/2017  Date of discharge: 02/15/2017  Admission diagnosis: Prostate Cancer  Discharge diagnosis: Prostate Cancer  History and Physical: For full details, please see admission history and physical. Briefly, Russell Thomas is a 57 y.o. gentleman with localized prostate cancer.  After discussing management/treatment options, he elected to proceed with surgical treatment.  Hospital Course: SHAQUILLE JANES was taken to the operating room on 02/14/2017 and underwent a robotic assisted laparoscopic radical prostatectomy. He tolerated this procedure well and without complications. Postoperatively, he was able to be transferred to a regular hospital room following recovery from anesthesia.  He was able to begin ambulating the night of surgery. He remained hemodynamically stable overnight.  He had excellent urine output with appropriately minimal output from his pelvic drain and his pelvic drain was removed on POD #1.  He was transitioned to oral pain medication, tolerated a clear liquid diet, and had met all discharge criteria and was able to be discharged home later on POD#1.  Laboratory values:  Recent Labs    02/14/17 1611 02/15/17 0436  HGB 14.0 12.3*  HCT 40.4 36.0*    Disposition: Home  Discharge instruction: He was instructed to be ambulatory but to refrain from heavy lifting, strenuous activity, or driving. He was instructed on urethral catheter care.  Discharge medications:   Allergies as of 02/15/2017      Reactions   Cephalosporins Hives, Rash   Ciprofloxacin Rash   Omeprazole Rash   Penicillins Itching, Rash, Other (See Comments)   Weezing, blisters Has patient had a PCN reaction causing immediate rash, facial/tongue/throat swelling, SOB or lightheadedness with hypotension: Yes Has patient had a PCN reaction causing severe rash involving mucus membranes or skin necrosis: No Has patient had a PCN reaction that required hospitalization: No Has patient had a  PCN reaction occurring within the last 10 years: No If all of the above answers are "NO", then may proceed with Cephalosporin use.   Shrimp [shellfish Allergy] Rash   Sulfonamide Derivatives Rash   Omnipaque [iohexol] Itching, Other (See Comments)   Scratchy palms, scratchy throat, face and chest redness, hives all over. (CT fluoroscopic Contrast)        Medication List    STOP taking these medications   FISH OIL PO   MULTIVITAMIN PO   naproxen sodium 220 MG tablet Commonly known as:  ALEVE     TAKE these medications   clobetasol cream 0.05 % Commonly known as:  TEMOVATE Apply 1 application topically 2 (two) times daily as needed (for rash).   diazepam 5 MG tablet Commonly known as:  VALIUM Take 5 mg by mouth at bedtime as needed for anxiety.   doxycycline 100 MG capsule Commonly known as:  VIBRAMYCIN Take 1 capsule (100 mg total) by mouth 2 (two) times daily for 3 days. Start day prior to appt for foley removal   HYDROcodone-acetaminophen 5-325 MG tablet Commonly known as:  NORCO Take 1-2 tablets by mouth every 6 (six) hours as needed for moderate pain or severe pain.   hydrocortisone cream 1 % Apply 1 application topically 2 (two) times daily as needed for itching.   loratadine 10 MG tablet Commonly known as:  CLARITIN Take 10 mg daily as needed by mouth for allergies.   mometasone 0.1 % cream Commonly known as:  ELOCON Apply 1 application topically daily as needed (for rash).   OPCON-A 0.027-0.315 % Soln Generic drug:  Naphazoline-Pheniramine Place 1-2 drops into both eyes daily as needed (for dry  eyes).       Followup: He will followup in 1 week for catheter removal and to discuss his surgical pathology results.

## 2017-02-15 NOTE — Progress Notes (Signed)
Patient ID: Russell Thomas, male   DOB: 1961/01/30, 57 y.o.   MRN: [id number]  1 Day Post-Op Subjective: The patient is doing well.  No nausea or vomiting. Pain is adequately controlled.  Objective: Vital signs in last 24 hours: Temp:  [97.6 F (36.4 C)-98.6 F (37 C)] 98.2 F (36.8 C) (01/11 0701) Pulse Rate:  [80-96] 82 (01/11 0701) Resp:  [12-18] 16 (01/11 0701) BP: (117-142)/(70-95) 117/75 (01/11 0701) SpO2:  [93 %-100 %] 98 % (01/11 0701) Weight:  [134.7 kg (297 lb)] 134.7 kg (297 lb) (01/10 0942)  Intake/Output from previous day: 01/10 0701 - 01/11 0700 In: 5410 [I.V.:4410; IV Piggyback:1000] Out: 2345 [Urine:1900; Drains:195; Blood:250] Intake/Output this shift: Total I/O In: -  Out: 900 [Urine:900]  Physical Exam:  General: Alert and oriented. CV: RRR Lungs: Clear bilaterally. GI: Soft, Nondistended. Incisions: Clean, dry, and intact Urine: Clear Extremities: Nontender, no erythema, no edema.  Lab Results: Recent Labs    02/14/17 1611 02/15/17 0436  HGB 14.0 12.3*  HCT 40.4 36.0*      Assessment/Plan: POD# 1 s/p robotic prostatectomy.  1) SL IVF 2) Ambulate, Incentive spirometry 3) Transition to oral pain medication 4) Dulcolax suppository 5) D/C pelvic drain 6) Plan for likely discharge later today   Pryor Curia. MD   LOS: 0 days   Nathanyal Ashmead,LES 02/15/2017, 7:52 AM

## 2017-02-25 NOTE — Anesthesia Postprocedure Evaluation (Signed)
Anesthesia Post Note  Patient: Russell Thomas  Procedure(s) Performed: XI ROBOTIC ASSISTED LAPAROSCOPIC RADICAL PROSTATECTOMY LEVEL 3 (N/A ) LYMPHADENECTOMY/PELVIC (Bilateral )     Patient location during evaluation: PACU Anesthesia Type: General Level of consciousness: awake and alert Pain management: pain level controlled Vital Signs Assessment: post-procedure vital signs reviewed and stable Respiratory status: spontaneous breathing, nonlabored ventilation, respiratory function stable and patient connected to nasal cannula oxygen Cardiovascular status: blood pressure returned to baseline and stable Postop Assessment: no apparent nausea or vomiting Anesthetic complications: no    Last Vitals:  Vitals:   02/15/17 1020 02/15/17 1426  BP: 107/66 122/74  Pulse: 78 86  Resp: 16 12  Temp: 36.9 C 36.9 C  SpO2: 98% 98%    Last Pain:  Vitals:   02/15/17 1426  TempSrc: Oral  PainSc:                  Caribou S

## 2017-04-13 ENCOUNTER — Encounter: Payer: Self-pay | Admitting: Gastroenterology

## 2017-06-04 ENCOUNTER — Telehealth: Payer: Self-pay | Admitting: Medical Oncology

## 2017-06-04 NOTE — Telephone Encounter (Signed)
I called pt to discuss his referral to the [facility]. I met "Russell Thomas" in our prostate support group after his initial diagnosis of prostate cancer. He underwent a robotic prostatectomy and unfortunately his pathology showed lymph node involvement. He is referred by Dr. Alinda Money to discuss next treatment steps.   1. I confirmed with the patient he is aware of his referral to the clinic 06/14/17 arriving at 8:30 am.  2. I discussed the format of the clinic and the physicians he will be seeing that day.  3. I discussed where the clinic is located and how to contact me.  4. I confirmed his address and informed him I would be mailing a packet of information and forms to be completed. I asked him to bring them with him the day of his appointment.   He voiced understanding of the above. I asked him to call me if he has any questions or concerns regarding his appointments or the forms he needs to complete.

## 2017-06-13 ENCOUNTER — Telehealth: Payer: Self-pay | Admitting: Medical Oncology

## 2017-06-13 NOTE — Progress Notes (Signed)
GU Location of Tumor / Histology: pT3aN1 adenocarcinoma of the prostate with 2 of 11 lymph nodes positive on the right  If Prostate Cancer, Gleason Score is (3 + 4) and PSA is (5.68) pretreatment.   Russell Thomas s/p UNS RAL radical prostatectomy and BPLND on 02/14/2017    Past/Anticipated interventions by urology, if any: prostate biopsy, prostatectomy, referral to [facility]  Past/Anticipated interventions by medical oncology, if any: no  Weight changes, if any: down 33 lb since diagnosis  Bowel/Bladder complaints, if any: Urinary incontinence down to 3-+4 pads per day from 5-6, urinary frequency, urgency and nocturia x 1-2   Nausea/Vomiting, if any: no  Pain issues, if any:  Burning in spine  SAFETY ISSUES:  Prior radiation? no  Pacemaker/ICD? no  Possible current pregnancy? no  Is the patient on methotrexate? no  Current Complaints / other details:  57 year old male. Married.

## 2017-06-13 NOTE — Telephone Encounter (Signed)
Left a message reminder for Prostate [facility] 06/14/17 arriving here at [facility] at 8:00am. I reviewed the location, [place] parking, registration and reminded him to bring his completed medical forms.

## 2017-06-14 ENCOUNTER — Other Ambulatory Visit: Payer: Self-pay

## 2017-06-14 ENCOUNTER — Telehealth: Payer: Self-pay | Admitting: Oncology

## 2017-06-14 ENCOUNTER — Inpatient Hospital Stay: Payer: Managed Care, Other (non HMO) | Attending: Oncology | Admitting: Oncology

## 2017-06-14 ENCOUNTER — Encounter: Payer: Self-pay | Admitting: Radiation Oncology

## 2017-06-14 ENCOUNTER — Ambulatory Visit
Admission: RE | Admit: 2017-06-14 | Discharge: 2017-06-14 | Disposition: A | Discharge status: Home / Self Care | Payer: Managed Care, Other (non HMO) | Source: Ambulatory Visit | Attending: Radiation Oncology | Admitting: Radiation Oncology

## 2017-06-14 ENCOUNTER — Encounter: Payer: Self-pay | Admitting: Medical Oncology

## 2017-06-14 ENCOUNTER — Encounter: Payer: Self-pay | Admitting: General Practice

## 2017-06-14 VITALS — BP 125/89 | HR 70 | Temp 98.7°F | Resp 18 | Ht 73.0 in | Wt 267.6 lb

## 2017-06-14 DIAGNOSIS — E119 Type 2 diabetes mellitus without complications: Secondary | ICD-10-CM | POA: Insufficient documentation

## 2017-06-14 DIAGNOSIS — Z87891 Personal history of nicotine dependence: Secondary | ICD-10-CM | POA: Diagnosis not present

## 2017-06-14 DIAGNOSIS — E785 Hyperlipidemia, unspecified: Secondary | ICD-10-CM | POA: Diagnosis not present

## 2017-06-14 DIAGNOSIS — Z86010 Personal history of colonic polyps: Secondary | ICD-10-CM | POA: Insufficient documentation

## 2017-06-14 DIAGNOSIS — C61 Malignant neoplasm of prostate: Secondary | ICD-10-CM

## 2017-06-14 DIAGNOSIS — Z8719 Personal history of other diseases of the digestive system: Secondary | ICD-10-CM | POA: Insufficient documentation

## 2017-06-14 DIAGNOSIS — C779 Secondary and unspecified malignant neoplasm of lymph node, unspecified: Secondary | ICD-10-CM | POA: Insufficient documentation

## 2017-06-14 DIAGNOSIS — F419 Anxiety disorder, unspecified: Secondary | ICD-10-CM | POA: Insufficient documentation

## 2017-06-14 DIAGNOSIS — Z79899 Other long term (current) drug therapy: Secondary | ICD-10-CM | POA: Diagnosis not present

## 2017-06-14 DIAGNOSIS — G4730 Sleep apnea, unspecified: Secondary | ICD-10-CM | POA: Diagnosis not present

## 2017-06-14 DIAGNOSIS — Z9079 Acquired absence of other genital organ(s): Secondary | ICD-10-CM | POA: Diagnosis not present

## 2017-06-14 DIAGNOSIS — K589 Irritable bowel syndrome without diarrhea: Secondary | ICD-10-CM | POA: Insufficient documentation

## 2017-06-14 HISTORY — DX: Malignant neoplasm of prostate: C61

## 2017-06-14 NOTE — Progress Notes (Signed)
Reason for Referral: Prostate cancer  HPI: 57 year old gentleman native of [place] and currently resides in [place].  He has history of hypertension, IBS and obesity and was diagnosed with prostate cancer in October 2018.  At that time his PSA was 5.68 and had a Gleason score of 3+4 = 7 in 2 cores.  He subsequently underwent a radical prostatectomy under the care of Dr. Alinda Money on February 14, 2017.  The final pathology showed a Gleason score 3+4 = 7 involving both lobes with extraprostatic extension and negative margins.  2 out of 5 metastatic lymph nodes noted in the right pelvis with a 0 out of 6 lymph nodes in the left pelvis.  He is PSA postoperatively on April 30 was undetectable.  He is recovering slowly from that operation and has suffered with incontinence although that is slowly improving.  He is also suffering from erectile dysfunction with no recovery of his erectile dysfunction at this time.  He has resumed work-related duties and continues to work full-time.  He denies any hematuria or dysuria.  He currently uses 2 pads a day.  He does not report any headaches, blurry vision, syncope or seizures. Does not report any fevers, chills or sweats.  Does not report any cough, wheezing or hemoptysis.  Does not report any chest pain, palpitation, orthopnea or leg edema.  Does not report any nausea, vomiting or abdominal pain.  Does not report any constipation or diarrhea.  Does not report any skeletal complaints.     Does not report any skin rashes or lesions. Does not report any heat or cold intolerance.  Does not report any lymphadenopathy or petechiae.  Does not report any anxiety or depression.  Remaining review of systems is negative.    Past Medical History:  Diagnosis Date  . Adenomatous colon polyp 2008   Dr Deatra Ina, GI  . Allergic rhinitis   . Anxiety   . Diverticulosis   . Gallbladder attack 2015   stones present  . Gastritis   . Hyperlipemia 2009  . IBS  (irritable bowel syndrome)   . Internal hemorrhoids 2012   banded   . Lichen planus   . Pre-diabetes   . Prostate cancer ([place])   . Seasonal allergies   . Sleep apnea    [facility]  :  Past Surgical History:  Procedure Laterality Date  . BIOPSY PROSTATE  12/03/2016  . COLONOSCOPY  04/10/2010, 08/12/2006   3/12 with banding; 2008: adenomatous polyp, diverticulosis  . LYMPHADENECTOMY Bilateral 02/14/2017   Procedure: LYMPHADENECTOMY/PELVIC;  Surgeon: Raynelle Bring, MD;  Location: [facility];  Service: Urology;  Laterality: Bilateral;  . ROBOT ASSISTED LAPAROSCOPIC RADICAL PROSTATECTOMY N/A 02/14/2017   Procedure: XI ROBOTIC ASSISTED LAPAROSCOPIC RADICAL PROSTATECTOMY LEVEL 3;  Surgeon: Raynelle Bring, MD;  Location: [facility];  Service: Urology;  Laterality: N/A;  . SEPTOPLASTY    . thumb surgery    . TONSILLECTOMY    . UPPER GASTROINTESTINAL ENDOSCOPY  10/24/2001   gastritis  . UPPER GASTROINTESTINAL ENDOSCOPY  11/03/12  :   Current Outpatient Medications:  .  loratadine (CLARITIN) 10 MG tablet, Take 10 mg daily as needed by mouth for allergies., Disp: , Rfl:  .  mometasone (ELOCON) 0.1 % cream, Apply 1 application topically daily as needed (for rash)., Disp: , Rfl:  .  Multiple Vitamins-Minerals (CENTRUM SILVER PO), Take by mouth., Disp: , Rfl:  .  nitrofurantoin (MACRODANTIN) 100 MG capsule, Take 100 mg by mouth 2 (two) times  daily., Disp: , Rfl: 0 .  Omega-3 Fatty Acids (FISH OIL) 1000 MG CAPS, Take by mouth., Disp: , Rfl:  .  sildenafil (REVATIO) 20 MG tablet, Take by mouth., Disp: , Rfl:  .  triamcinolone (NASACORT ALLERGY 24HR) 55 MCG/ACT AERO nasal inhaler, Place 2 sprays into the nose daily., Disp: , Rfl: :  Allergies  Allergen Reactions  . Cephalosporins Hives and Rash  . Ciprofloxacin Rash  . Omeprazole Rash  . Penicillins Itching, Rash and Other (See Comments)    Weezing, blisters Has patient had a PCN reaction causing immediate rash, facial/tongue/throat  swelling, SOB or lightheadedness with hypotension: Yes Has patient had a PCN reaction causing severe rash involving mucus membranes or skin necrosis: No Has patient had a PCN reaction that required hospitalization: No Has patient had a PCN reaction occurring within the last 10 years: No If all of the above answers are "NO", then may proceed with Cephalosporin use.   . Shrimp [Shellfish Allergy] Rash  . Sulfonamide Derivatives Rash  . Omnipaque [Iohexol] Itching and Other (See Comments)    Scratchy palms, scratchy throat, face and chest redness, hives all over. (CT fluoroscopic Contrast)    :  Family History  Problem Relation Age of Onset  . Thyroid disease Mother        hypothyroidism  . Allergies Mother   . Parkinson's disease Mother        not definite  . Atrial fibrillation Mother   . Colitis Father   . Alcohol abuse Paternal Grandfather   :  Social History   Socioeconomic History  . Marital status: Married    Spouse name: Not on file  . Number of children: 1  . Years of education: Not on file  . Highest education level: Not on file  Occupational History  . Occupation: Scientist, clinical (histocompatibility and immunogenetics): [employer]   Social Needs  . Financial resource strain: Not on file  . Food insecurity:    Worry: Not on file    Inability: Not on file  . Transportation needs:    Medical: Not on file    Non-medical: Not on file  Tobacco Use  . Smoking status: Former Smoker    Types: Cigars  . Smokeless tobacco: Never Used  . Tobacco comment:  RARE ; <1 -2 cigar / YEAR  Substance and Sexual Activity  . Alcohol use: Yes    Comment: rare ; 0-1 drink / month  . Drug use: No  . Sexual activity: Yes  Lifestyle  . Physical activity:    Days per week: Not on file    Minutes per session: Not on file  . Stress: Not on file  Relationships  . Social connections:    Talks on phone: Not on file    Gets together: Not on file    Attends religious service: Not on file    Active member of club or  organization: Not on file    Attends meetings of clubs or organizations: Not on file    Relationship status: Not on file  . Intimate partner violence:    Fear of current or ex partner: Not on file    Emotionally abused: Not on file    Physically abused: Not on file    Forced sexual activity: Not on file  Other Topics Concern  . 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      :    Exam: ECOG 0 General appearance: alert and cooperative appeared without distress. Head: atraumatic without any abnormalities. Eyes: conjunctivae/corneas clear. PERRL.  Sclera anicteric. Throat: lips, mucosa, and tongue normal; without oral thrush or ulcers. Resp: clear to auscultation bilaterally without rhonchi, wheezes or dullness to percussion. Cardio: regular rate and rhythm, S1, S2 normal, no murmur, click, rub or gallop GI: soft, non-tender; bowel sounds normal; no masses,  no organomegaly Skin: Skin color, texture, turgor normal. No rashes or lesions Lymph nodes: Cervical, supraclavicular, and axillary nodes normal. Neurologic: Grossly normal without any motor, sensory or deep tendon reflexes. Musculoskeletal: No joint deformity or effusion.  CBC    Component Value Date/Time   WBC 9.1 02/11/2017 1344   RBC 4.82 02/11/2017 1344   HGB 12.3 (L) 02/15/2017 0436   HGB 15.4 10/18/2015 0934   HCT 36.0 (L) 02/15/2017 0436   HCT 44.2 10/18/2015 0934   PLT 220 02/11/2017 1344   PLT 195 10/18/2015 0934   MCV 90.9 02/11/2017 1344   MCV 90.2 10/18/2015 0934   MCH 31.5 02/11/2017 1344   MCHC 34.7 02/11/2017 1344   RDW 13.1 02/11/2017 1344   RDW 12.9 10/18/2015 0934   LYMPHSABS 2.2 10/18/2015 0934   MONOABS 0.6 10/18/2015 0934   EOSABS 0.1 10/18/2015 0934   BASOSABS 0.0 10/18/2015 0934     Chemistry      Component Value Date/Time   NA 140 02/11/2017 1344   NA 138 11/29/2014    K 4.7 02/11/2017 1344   CL 108 02/11/2017 1344   CO2 27 02/11/2017 1344   BUN 19 02/11/2017 1344   BUN 18 11/29/2014   CREATININE 0.89 02/11/2017 1344   CREATININE 1.06 08/31/2015 1539      Component Value Date/Time   CALCIUM 9.3 02/11/2017 1344   ALKPHOS 47 08/19/2015 1030   AST 18 08/19/2015 1030   ALT 25 08/19/2015 1030   BILITOT 0.5 08/19/2015 1030       Assessment and Plan:   57 year old gentleman with prostate cancer diagnosed in October 2018.  At that time he had a Gleason score of 3+4 = 7 and a PSA of 5.68.  He underwent a radical prostatectomy on February 14, 2017 with the final pathological staging was T3N1 with 2 out of 5 lymph nodes on the right pelvis involved with cancer.  0 out of 6 in the left showed malignancy.  He does have extraprostatic extension was present.  He is postoperative PSA is undetectable on June 04, 2017.  His case was discussed today in the prostate cancer multidisciplinary clinic.  Treatment options were reviewed today with the patient and his wife extensively.  These options would include adjuvant radiation therapy and short-term androgen deprivation therapy for 6 months versus continued observation and surveillance and monitor his PSA closely.  Salvage radiation with ADT can be used upon rise in his PSA.  The rationale as well as the downside of both approaches with today in detail.  Complication associated with androgen deprivation was also reviewed at this time which include weight gain, hot flashes, erectile dysfunction and excessive fatigue.  We have also talked about long-term survivorship as well as the risk of cancer recurrence.  We also discussed systemic treatment options should the cancer metastasized to the bone as well as lymphadenopathy.  He had multiple questions regarding diagnosis as well as prognosis as well as quality of life issues that were addressed today.  He will make his decision in the near future whether to pursue adjuvant  radiation therapy or defer that option to a salvage option in the future.  30  minutes was spent with the patient face-to-face today.  More than 50% of time was dedicated to patient counseling, education and answering questions regarding future plan of care.

## 2017-06-14 NOTE — Consult Note (Signed)
[facility]     06/14/2017   --------------------------------------------------------------------------------   [place]  MRN: [id number]  PRIMARY CARE:  Delanna Ahmadi, MD  DOB: 05-18-1960, 57 year old Male  REFERRING:    SSN:-[id number]  PROVIDER:  Louis Meckel, M.D.    TREATING:  Raynelle Bring, M.D.    LOCATION:  [facility] [phone]   --------------------------------------------------------------------------------   CC/HPI: CC: Prostate Cancer   PCP: Dr. Lavone Orn  Location of consult: [facility] - Prostate Cancer [facility]   [place] is a 57 year old gentleman who was initially diagnosed with Gleason 3+4=7 adenocarcinoma of the prostate in October 2018. His PSA was 5.68 at the time. He did have a palpable right nodule. He elected surgical therapy and is s/p a UNS RAL radical prostatectomy and BPLND on 02/14/17 with wide excision on the right side due to suspicion of EPE on preoperative MRI. His initial PSA postoperatively was undetectable (<0.015).   Diagnosis: pT3a N1 Mx, Gleason 3+4=7 adenocarcinoma with negative surgical margins (2/11 LNs positive)  Pretreatment PSA: 5.68  Pretreatment SHIM score: 24    PMH: He has a history of anxiety, obesity, GERD, sleep apnea, and depression.   He presents today to discuss options for possible adjuvant therapy. Since his last visit with me, he has had a 2nd opinion with Dr. Leroy Libman at [facility]. He did offer him the option of androgen deprivation therapy but did not recommend adjuvant radiation therapy. [place] continues to recover with regard to his continence. He has seen recent improvements and has been encouraged by these results. He is currently using 2 pads per day.     ALLERGIES: Cephalosporins Cipro TABS Omnipaque SOLN - Itching, Swelling, CT contrast Penicillins Shellfish-derived Products  Sulfa Drugs    MEDICATIONS: Sildenafil 20 mg  tablet 1-5 tablets PO PRN as directed  [place] 1 PO Daily     GU PSH: Laparoscopy; Lymphadenectomy - 02/14/2017 Prostate Needle Biopsy - 12/03/2016 Robotic Radical Prostatectomy - 02/14/2017 Vasectomy - 2007      [place] Notes: Hand Surgery, Oral Surgery Tooth Extraction, Tonsillectomy, Surgery Of Male Genitalia Vasectomy   NON-GU PSH: Dental Surgery Procedure - 2007 Remove Tonsils - 2007 Surgical Pathology, Gross And Microscopic Examination For Prostate Needle - 12/03/2016    GU PMH: Pelvic/perineal pain - 06/13/2017 Acute Cystitis/UTI - 04/12/2017 Urinary Frequency (Stable) - 03/14/2017, Increased urinary frequency, - 2014 Bacteriuria - 03/11/2017 ED due to arterial insufficiency - 02/20/2017 Stress Incontinence - 02/20/2017 Prostate Cancer - 12/25/2016 Dysuria, Dysuria - 2014 Inflammatory Disease Prostate, Unspec, Prostatitis - 2014 Nocturia, Nocturia - 2014 Renal calculus, Nephrolithiasis - 2014      PMH Notes:   1) Prostate cancer: He is s/p a UNS RAL radical prostatectomy and BPLND on 02/14/17.   Diagnosis: pT3a N1 Mx, Gleason 3+4=7 adenocarcinoma with negative surgical margins (2/11 LNs positive)  Pretreatment PSA: 5.68  Pretreatment SHIM score: 24   NON-GU PMH: Muscle weakness (generalized) - 03/14/2017 Other specified disorders of muscle - 03/14/2017 Anxiety, Anxiety (Symptom) - 2014 Depression GERD Sleep Apnea    FAMILY HISTORY: 1 son - Other Atrial Fibrillation - Mother Family Health Status Number - Runs In Family Parkinson's Disease - Mother Patient's father is still living - Other patient's mother is still living - Other    Notes: Mother is 58  Father is 45   SOCIAL HISTORY: Marital Status: Married Preferred Language: English; Ethnicity: Not Hispanic Or Latino; Race: White Current Smoking Status:  Patient has never smoked.  Drinks 2 drinks per month.  Drinks 3 caffeinated drinks per day. Patient's occupation Brewing technologist.    REVIEW OF SYSTEMS:    GU Review  Male:   Patient denies frequent urination, hard to postpone urination, burning/ pain with urination, get up at night to urinate, leakage of urine, stream starts and stops, trouble starting your streams, and have to strain to urinate .  Gastrointestinal (Lower):   Patient denies diarrhea and constipation.  Gastrointestinal (Upper):   Patient denies nausea and vomiting.  Constitutional:   Patient denies night sweats, weight loss, fever, and fatigue.  Skin:   Patient denies skin rash/ lesion and itching.  Eyes:   Patient denies blurred vision and double vision.  Ears/ Nose/ Throat:   Patient denies sore throat and sinus problems.  Hematologic/Lymphatic:   Patient denies swollen glands and easy bruising.  Cardiovascular:   Patient denies leg swelling and chest pains.  Respiratory:   Patient denies cough and shortness of breath.  Endocrine:   Patient denies excessive thirst.  Musculoskeletal:   Patient denies back pain and joint pain.  Neurological:   Patient denies headaches and dizziness.  Psychologic:   Patient denies depression and anxiety.   VITAL SIGNS: None   MULTI-SYSTEM PHYSICAL EXAMINATION:    Constitutional: Well-nourished. No physical deformities. Normally developed. Good grooming.     PAST DATA REVIEWED:  Source Of History:  Patient  Lab Test Review:   PSA  Records Review:   Pathology Reports, Previous Patient Records  Urine Test Review:   Urinalysis   04/05/17 10/15/16 03/26/16 02/10/16 03/06/06  PSA  Total PSA <0.015 ng/mL 5.68 ng/mL 4.91 ng/dl 4.12 ng/dl 1.74   Free PSA  0.59 ng/mL     % Free PSA  10 % PSA       03/06/06  Hormones  Testosterone, Total 1.99    Notes:                     PSA from 06/04/2017 was less than 0.015.   PROCEDURES: None   ASSESSMENT:      ICD-10 Details  1 GU:   Prostate Cancer - C61    PLAN:           Document Letter(s):  Created for Patient: Clinical Summary         Notes:   1. Lymph node positive prostate cancer: I had a long  detailed discussion with [place] and his wife today regarding the option of adjuvant therapy verses continued PSA surveillance. We discussed the options offered to him at [facility]. We also reviewed his recent PSA from 06/04/2017 that was completely undetectable.   As he currently has no evidence of detectable disease, we discussed the option of androgen deprivation therapy. We discussed the level 1 evidence that does exist suggesting earlier androgen deprivation can be beneficial compared to later androgen deprivation with regard to survival for lymph node positive disease. However, we discussed this in the context of the [place] trial that enrolled patient is in a different era when less complete lymphadenectomy is were performed. Furthermore, we discussed the control arm of this trial but did not start therapy until much later than would be considered standard today. Therefore, I did not recommend early androgen deprivation therapy strongly although I did agree with the concept of earlier rather than later androgen deprivation therapy. However, he understands that this is not a curative treatment.   We then discussed the option  of adjuvant therapy with the idea of trying to lower his risk of recurrence. He understands that he is at a high risk of recurrence considering his extraprostatic disease and lymph node positive disease. However, he does have limited lymph node positive disease and he understands the importance of this distinction. He was not offered the option of adjuvant radiation therapy at [facility]. He understands that there is not level 1 evidence to treat lymph node positive disease in the setting. However, there is robust retrospective dated that suggests a benefit particularly in combination with androgen deprivation therapy for decreasing the risk of biochemical recurrence. We also discussed his increased risk for local recurrence considering his extraprostatic disease. For these  reasons, he was offered the option of adjuvant radiation therapy with treatment of his prostatic fossa and pelvic lymph nodes. It was also recommended that he consider androgen deprivation therapy concomitantly. We reviewed the lack of dated to tell us the duration of therapy but leaned toward recommending long-term therapy for 18-24 months as long as he was tolerating this relatively well. We did review the potential side effects of androgen deprivation therapy and radiation therapy in detail.   [place] asked numerous questions that were answered to the best my ability and to his stated satisfaction. He is going to consider his options and will notify me how he would like to proceed.   If he elects to proceed with observation, he did raise the question of whether proceeding with additional imaging such as a fluciclovine PET scan would be beneficial. I did discuss the lack of evidence to suggest benefit in the absence of recurrent disease. However, this may offer him significant psychological benefit if his PET scan were to be negative in the setting of continued observation. He has had preliminary discussions with his insurance company who did apparently tell him that they would approve this if we agreed it was beneficial. He understands that I would only recommend this for his psychological benefit in the setting of him proceeding with observation and close PSA surveillance.   He will notify me how he would like to proceed and we will arrange appropriate follow-up/treatment as indicated.   He is scheduled to see both Dr. Tammi Klippel and Dr. Alen Blew this morning as well.   Cc: Dr. Tammi Klippel Dr. Alen Blew       Next Appointment:      Next Appointment: 07/03/2017 12:00 PM    Appointment Type: 42 Physical Therapy    Location: [facility] [phone]    Provider: Doran Durand      E & M CODE: I spent at least 55 minutes face to face with the patient, more than 50% of that time was  spent on counseling and/or coordinating care.

## 2017-06-14 NOTE — Progress Notes (Signed)
Radiation Oncology         [phone] ________________________________ Multidisciplinary Prostate Conference Initial outpatient Consultation  Name: Russell Thomas MRN: [id number]  Date: 06/14/2017  DOB: [birth year]  LG:[id number], Jenny Reichmann, MD  Raynelle Bring, MD   REFERRING PHYSICIAN: Raynelle Bring, MD  DIAGNOSIS: 57 year-old gentleman with h/o Stage pT3aN1M0 adenocarcinoma of the prostate with Gleason Score of 3+4, and pretreatment PSA of 5.68, now s/p radical prostatectomy with adverse pathology findings of EPE, 2/12 lymph nodes positive and.PNI.      ICD-10-CM   1. Malignant neoplasm of prostate ([place]) C61     HISTORY OF PRESENT ILLNESS: Russell Thomas is a 57 y.o. male who was originally diagnosed with prostate cancer in October 2018 with a pre-treatment PSA of 5.68. Prostate MRI on 01/21/2017 showed a 2.3 cm lesion in the right mid gland to base, corresponding to the patient's known high-grade macroscopic prostate cancer. Additionally, there was suspected mild extracapsular extension with involvement of the right neurovascular bundle but no pelvic lymphadenopathy or definite seminal vesicle invasion. The patient underwent radical robot assisted laparoscopic prostatectomy with Dr. Alinda Money on 02/14/2017 for pT3a N1 Mx, Gleason 3+4 adenocarcinoma.  Surgical margins were negative but there was EPE, PNI and two of five lymph nodes sampled from the right pelvis were positive for metastatic carcinoma. A post-treatment PSA was undetectable 04/05/17 and again 06/04/17.  Bone scan on 02/11/2017 showed no findings suspicious for metastatic disease to the skeleton.  The patient consulted with [facility] radiation oncologist Dr. Tharon Aquas on 04/17/2017 regarding adjuvant radiation treatment options.  He also met with Dr. Marcello Moores at [facility] to discuss adjuvant ADT.  There=ir recommendation was to proceed with adjuvant ADT alone.  He has continued to make good progress regaining his continence and is currently  down to using 2ppd.  The patient reviewed the biopsy results with his urologist and he has kindly been referred today for discussion of potential radiation treatment options.   PREVIOUS RADIATION THERAPY: No  PAST MEDICAL HISTORY:  Past Medical History:  Diagnosis Date  . Adenomatous colon polyp 2008   Dr Deatra Ina, GI  . Allergic rhinitis   . Anxiety   . Diverticulosis   . Gallbladder attack 2015   stones present  . Gastritis   . Hyperlipemia 2009  . IBS (irritable bowel syndrome)   . Internal hemorrhoids 2012   banded   . Lichen planus   . Pre-diabetes   . Prostate cancer ([place])   . Seasonal allergies   . Sleep apnea    [facility]      PAST SURGICAL HISTORY: Past Surgical History:  Procedure Laterality Date  . BIOPSY PROSTATE  12/03/2016  . COLONOSCOPY  04/10/2010, 08/12/2006   3/12 with banding; 2008: adenomatous polyp, diverticulosis  . LYMPHADENECTOMY Bilateral 02/14/2017   Procedure: LYMPHADENECTOMY/PELVIC;  Surgeon: Raynelle Bring, MD;  Location: [facility];  Service: Urology;  Laterality: Bilateral;  . ROBOT ASSISTED LAPAROSCOPIC RADICAL PROSTATECTOMY N/A 02/14/2017   Procedure: XI ROBOTIC ASSISTED LAPAROSCOPIC RADICAL PROSTATECTOMY LEVEL 3;  Surgeon: Raynelle Bring, MD;  Location: [facility];  Service: Urology;  Laterality: N/A;  . SEPTOPLASTY    . thumb surgery    . TONSILLECTOMY    . UPPER GASTROINTESTINAL ENDOSCOPY  10/24/2001   gastritis  . UPPER GASTROINTESTINAL ENDOSCOPY  11/03/12    FAMILY HISTORY:  Family History  Problem Relation Age of Onset  . Thyroid disease Mother        hypothyroidism  . Allergies Mother   .  Parkinson's disease Mother        not definite  . Atrial fibrillation Mother   . Colitis Father   . Alcohol abuse Paternal Grandfather     SOCIAL HISTORY:  Social History   Socioeconomic History  . Marital status: Married    Spouse name: Not on file  . Number of children: 1  . Years of education: Not on file  . Highest  education level: Not on file  Occupational History  . Occupation: Scientist, clinical (histocompatibility and immunogenetics): [employer]   Social Needs  . Financial resource strain: Not on file  . Food insecurity:    Worry: Not on file    Inability: Not on file  . Transportation needs:    Medical: Not on file    Non-medical: Not on file  Tobacco Use  . Smoking status: Former Smoker    Types: Cigars  . Smokeless tobacco: Never Used  . Tobacco comment:  RARE ; <1 -2 cigar / YEAR  Substance and Sexual Activity  . Alcohol use: Yes    Comment: rare ; 0-1 drink / month  . Drug use: No  . Sexual activity: Yes  Lifestyle  . Physical activity:    Days per week: Not on file    Minutes per session: Not on file  . Stress: Not on file  Relationships  . Social connections:    Talks on phone: Not on file    Gets together: Not on file    Attends religious service: Not on file    Active member of club or organization: Not on file    Attends meetings of clubs or organizations: Not on file    Relationship status: Not on file  . Intimate partner violence:    Fear of current or ex partner: Not on file    Emotionally abused: Not on file    Physically abused: Not on file    Forced sexual activity: Not on file  Other Topics Concern  . 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        ALLERGIES: Cephalosporins; Ciprofloxacin; Iodinated diagnostic agents; Omeprazole; Penicillins; Shrimp [shellfish allergy]; Sulfa antibiotics; Sulfonamide derivatives; and Omnipaque [iohexol]  MEDICATIONS:  Current Outpatient Medications  Medication Sig Dispense Refill  . loratadine (CLARITIN) 10 MG tablet Take 10 mg daily as needed by mouth for allergies.    . mometasone (ELOCON) 0.1 % cream Apply 1 application topically daily as needed (for rash).    . Multiple Vitamins-Minerals (CENTRUM SILVER PO) Take by mouth.    . Omega-3 Fatty  Acids (FISH OIL) 1000 MG CAPS Take by mouth.    . sildenafil (REVATIO) 20 MG tablet Take by mouth.    . triamcinolone (NASACORT ALLERGY 24HR) 55 MCG/ACT AERO nasal inhaler Place 2 sprays into the nose daily.    . nitrofurantoin (MACRODANTIN) 100 MG capsule Take 100 mg by mouth 2 (two) times daily.  0   No current facility-administered medications for this encounter.     REVIEW OF SYSTEMS:  On review of systems, the patient reports that he is doing well overall. He denies any chest pain, shortness of breath, cough, fevers, chills, night sweats. He reports weight loss of 33 lb since diagnosis. He denies any bowel disturbances, and denies abdominal pain, nausea or vomiting. He reports burning sensation in spine. He reports  urinary incontinence; down to 2 pads per day from 5-6. He also reports urinary frequency, urgency, and nocturia x 1-2. His IPSS was 5, indicating mild urinary symptoms. He is not able to complete sexual activity postoperatively. A complete review of systems is obtained and is otherwise negative.    PHYSICAL EXAM:  Wt Readings from Last 3 Encounters:  06/14/17 267 lb 9.6 oz (121.4 kg)  02/14/17 297 lb (134.7 kg)  02/11/17 297 lb (134.7 kg)   Temp Readings from Last 3 Encounters:  06/14/17 98.7 F (37.1 C) (Oral)  02/15/17 98.4 F (36.9 C) (Oral)  02/11/17 98.2 F (36.8 C) (Oral)   BP Readings from Last 3 Encounters:  06/14/17 125/89  02/15/17 122/74  02/11/17 136/88   Pulse Readings from Last 3 Encounters:  06/14/17 70  02/15/17 86  02/11/17 92   Pain Assessment Pain Score: 0-No pain/10  In general this is a well appearing caucasian male in no acute distress. The patient is accompanied by his wife today. He is alert and oriented x4 and appropriate throughout the examination. HEENT reveals that the patient is normocephalic, atraumatic. EOMs are intact. PERRLA. Skin is intact without any evidence of gross lesions. Cardiovascular exam reveals a regular rate and  rhythm, no clicks rubs or murmurs are auscultated. Chest is clear to auscultation bilaterally. Lymphatic assessment is performed and does not reveal any adenopathy in the cervical, supraclavicular, axillary, or inguinal chains. Abdomen has active bowel sounds in all quadrants and is intact. The abdomen is soft, non tender, non distended. Lower extremities are negative for pretibial pitting edema, deep calf tenderness, cyanosis or clubbing.   KPS = 100  100 - Normal; no complaints; no evidence of disease. 90   - Able to carry on normal activity; minor signs or symptoms of disease. 80   - Normal activity with effort; some signs or symptoms of disease. 16   - Cares for self; unable to carry on normal activity or to do active work. 60   - Requires occasional assistance, but is able to care for most of his personal needs. 50   - Requires considerable assistance and frequent medical care. 24   - Disabled; requires special care and assistance. 83   - Severely disabled; hospital admission is indicated although death not imminent. 70   - Very sick; hospital admission necessary; active supportive treatment necessary. 10   - Moribund; fatal processes progressing rapidly. 0     - Dead  Karnofsky DA, Abelmann [place], Craver LS and Burchenal [facility] [phone]) The use of the nitrogen mustards in the palliative treatment of carcinoma: with particular reference to bronchogenic carcinoma Cancer 1 634-56  LABORATORY DATA:  Lab Results  Component Value Date   WBC 9.1 02/11/2017   HGB 12.3 (L) 02/15/2017   HCT 36.0 (L) 02/15/2017   MCV 90.9 02/11/2017   PLT 220 02/11/2017   Lab Results  Component Value Date   NA 140 02/11/2017   K 4.7 02/11/2017   CL 108 02/11/2017   CO2 27 02/11/2017   Lab Results  Component Value Date   ALT 25 08/19/2015   AST 18 08/19/2015   ALKPHOS 47 08/19/2015   BILITOT 0.5 08/19/2015     RADIOGRAPHY: No results found.    IMPRESSION/PLAN: 1. 56 y.o. gentleman with h/o Stage  pT3aN1M0 adenocarcinoma of the prostate with Gleason Score of 3+4, and pre-treatement PSA of 5.68, now s/p radical prostatectomy with adverse findings on final pathology.  Today Dr. Tammi Klippel reviewed the findings  and workup thus far.  We discussed the natural history of prostate cancer.  We reviewed the implications of positive margins, extracapsular extension, lymph node involvement and seminal vesicle involvement on the risk of prostate cancer recurrence. We reviewed some of the evidence suggesting an advantage for patients who undergo adjuvant radiotherapy in the setting in terms of disease control and overall survival. We also discussed some of the dilemmas related to the available evidence.  We discussed the SWOG trial which did show an improvement in disease-free survival as well as overall survival using adjuvant radiotherapy. However, we discussed the fact that the study did not carefully control the usage of adjuvant radiotherapy in the observation arm. There is increasing evidence that careful surveillance with ultrasensitive PSA may provide an opportunity for early salvage in patients who undergo observation, which can lead to excellent results in terms of disease control and survival. We discussed radiation treatment directed to the prostatic fossa with regard to the logistics and delivery of external beam radiation treatment. The patient would like to proceed with continued PSA surveillance and allow for further improvement in his continence for now.  He will give further consideration to proceeding with adjuvant radiotherapy in the near future versus continued surveillance and proceeding with salvage XRT in combination with ADT if/when the PSA becomes detectable. We will share our findings with Dr. Alinda Money and will look forward to following along in his progress.   More than 50% of today's visit was spent in counseling and/or coordination of care.    Nicholos Johns, PA-C    Tyler Pita,  MD  [facility] Direct Dial: [phone]  Fax: [phone] [email or web address]  Skype  [email or web address]  This document serves as a record of services personally performed by Tyler Pita, MD and Freeman Caldron, PA-C. It was created on their behalf by Arlyce Harman, a trained medical scribe. The creation of this record is based on the scribe's personal observations and the provider's statements to them. This document has been checked and approved by the attending provider.

## 2017-06-14 NOTE — Progress Notes (Signed)
                               Care Plan Summary  Name: Russell Thomas DOB: [birth year]   Your Medical Team:   Urologist -  Dr. Raynelle Bring, [facility]  Radiation Oncologist - Dr. Tyler Pita, [facility]   Medical Oncologist - Dr. Zola Button, [place]  Recommendations: 1) Radiation -once continence has improved 2) Androgen deprivation-with radiation   * These recommendations are based on information available as of today's consult.      Recommendations may change depending on the results of further tests or exams.   Next Steps: 1) Consider options and call Cira Rue, RN with decision    When appointments need to be scheduled, you will be contacted by [facility] and/or [facility].  Patient provided with business cards for all team members and a copy of "[place] [facility] Patient Safety Sheet".  Questions?  Please do not hesitate to call Cira Rue, RN, BSN, OCN at [phone] any questions or concerns.  Shirlean Mylar is your Oncology Nurse Navigator and is available to assist you while you're receiving your medical care at [facility].

## 2017-06-14 NOTE — Telephone Encounter (Signed)
NO LOS 5/10 °

## 2017-06-17 NOTE — Progress Notes (Signed)
Russell Thomas Psychosocial Distress Screening Spiritual Care  Met with Russell Thomas, who goes by Russell Thomas, and his wife Russell Thomas, in [facility] to introduce [place] team/resources, reviewing distress screen per protocol.  The patient scored a 5 on the Psychosocial Distress Thermometer which indicates moderate distress. Also assessed for distress and other psychosocial needs.   ONCBCN DISTRESS SCREENING 06/17/2017  Screening Type Initial Screening  Distress experienced in past week (1-10) 5  Emotional problem type Adjusting to illness  Referral to support programs Yes    Had lengthy conversation with Russell Thomas and Russell Thomas, normalizing feelings and seeking support. In the past, Russell Thomas has found great inspiration and healing change through programs by life coach Russell Thomas. He is also finding support and encouragement in the Prostate Cancer Support Group.  Provided emotional support, suggested coping tools, affirmed strengths, and encouraged continued participation in [place].   Follow up needed: No. Couple plans to f/u with Spiritual Care/Support Team as desired, but please also page if immediate needs arise or circumstances change. Thank you.   [place], [place], [facility] Pager [phone] Voicemail [phone]

## 2017-07-26 ENCOUNTER — Other Ambulatory Visit: Payer: Self-pay | Admitting: Internal Medicine

## 2017-08-01 ENCOUNTER — Telehealth: Payer: Self-pay | Admitting: Hematology

## 2017-08-01 NOTE — Telephone Encounter (Signed)
Patient called to schedule an office visit with dr Irene Limbo.

## 2017-08-20 NOTE — Progress Notes (Signed)
Marland Kitchen    HEMATOLOGY/ONCOLOGY CONSULTATION NOTE  Date of Service: 08/28/2017  Patient Care Team: Lavone Orn, MD as PCP - General (Internal Medicine) Marshell Garfinkel, MD as Consulting Physician (Pulmonary Disease) Inda Castle, MD (Inactive) as Consulting Physician (Gastroenterology)  CHIEF COMPLAINTS/PURPOSE OF CONSULTATION:  Paraproteinemia  HISTORY OF PRESENTING ILLNESS:   Russell Thomas is a 57 y.o. male who has been referred to [place] by Dr .Lavone Orn, MD for evaluation and management of paraproteinemia.  Patient has a history of sleep apnea on CPAP, dyslipidemia, anxiety, severe allergic rhinitis, irritable bowel syndrome Who had issues with sinusitis and upper respiratory infection and some right cervical lymphadenitis.  He first had an SPEP on 02/14/2015 that showed possibility of a faint restricted band in the beta 1 region. On IFE there was a band of restricted mobility in the IgG and kappa lanes. It is unclear why the SPEP was done in the first place.  SPEP was repeated by the primary care physician on 08/19/2015 and no abnormal M spike was detected. His gammaglobulin levels were borderline reduced at 0.7 g/dL. SPEP was interpreted as possible early nephrotic pattern.  Patient was referred to [place] for possible paraproteinemia. Patient has no focal bone pains. Labs today showed no anemia. He has not had any previous renal failure or hypercalcemia. No other new focal concerns.  No weight loss. No new fatigue. Has been working out regularly.  INTERVAL HISTORY:   Russell Thomas returns today for management and evaluation of his possible paraproteinemia. The patient's last visit with [place] was on 12/19/16. The pt reports that he is doing well overall and enjoyed his recent trip to [place].   The pt had a prostatectomy on 05/30/93 with 2 implicated lymph nodes, and extra-prostatic extension. He has been followed by Dr. Tresa Moore and Dr. Raynelle Bring and his last PSA count was  undetectable on 08/06/17.   Prior to and after the surgery the pt had intermittent  Burning sensations in his spine and lower legs, below his knees. He was evaluated with a MRI spine on 07/20/17 which did not reveal an acute abnormality, but did note mild multilevel degenerative disc disease with loss of disc space height. He will be establishing care with a neurologist soon and has an appointment.   He was evaluated with a CT chest on 07/25/17 which revealed thyroid nodules and right sided hilar lymph nodes which be soon be biopsied.   The pt reports that he is considering pursuing radiation but has some concerns of changes in functional status. He has been doing exercises with PT to address his incontinence.   On review of systems, pt reports good energy levels, intermittent spine and b/l lower leg burning sensations, improving incontinence, and denies skin color changes, and any other symptoms.    MEDICAL HISTORY:  Past Medical History:  Diagnosis Date  . Adenomatous colon polyp 2008   Dr Deatra Ina, GI  . Allergic rhinitis   . Anxiety   . Diverticulosis   . Gallbladder attack 2015   stones present  . Gastritis   . Hyperlipemia 2009  . IBS (irritable bowel syndrome)   . Internal hemorrhoids 2012   banded   . Lichen planus   . Pre-diabetes   . Prostate cancer ([place])   . Seasonal allergies   . Sleep apnea    [facility]    SURGICAL HISTORY: Past Surgical History:  Procedure Laterality Date  . BIOPSY PROSTATE  12/03/2016  . COLONOSCOPY  04/10/2010,  08/12/2006   3/12 with banding; 2008: adenomatous polyp, diverticulosis  . LYMPHADENECTOMY Bilateral 02/14/2017   Procedure: LYMPHADENECTOMY/PELVIC;  Surgeon: Raynelle Bring, MD;  Location: [facility] ORS;  Service: Urology;  Laterality: Bilateral;  . ROBOT ASSISTED LAPAROSCOPIC RADICAL PROSTATECTOMY N/A 02/14/2017   Procedure: XI ROBOTIC ASSISTED LAPAROSCOPIC RADICAL PROSTATECTOMY LEVEL 3;  Surgeon: Raynelle Bring, MD;  Location: [facility]  ORS;  Service: Urology;  Laterality: N/A;  . SEPTOPLASTY    . thumb surgery    . TONSILLECTOMY    . UPPER GASTROINTESTINAL ENDOSCOPY  10/24/2001   gastritis  . UPPER GASTROINTESTINAL ENDOSCOPY  11/03/12    SOCIAL HISTORY: Social History   Socioeconomic History  . Marital status: Married    Spouse name: Not on file  . Number of children: 1  . Years of education: Not on file  . Highest education level: Not on file  Occupational History  . Occupation: Scientist, clinical (histocompatibility and immunogenetics): [employer]   Social Needs  . Financial resource strain: Not on file  . Food insecurity:    Worry: Not on file    Inability: Not on file  . Transportation needs:    Medical: Not on file    Non-medical: Not on file  Tobacco Use  . Smoking status: Former Smoker    Types: Cigars  . Smokeless tobacco: Never Used  . Tobacco comment:  RARE ; <1 -2 cigar / YEAR  Substance and Sexual Activity  . Alcohol use: Yes    Comment: rare ; 0-1 drink / month  . Drug use: No  . Sexual activity: Yes  Lifestyle  . Physical activity:    Days per week: Not on file    Minutes per session: Not on file  . Stress: Not on file  Relationships  . Social connections:    Talks on phone: Not on file    Gets together: Not on file    Attends religious service: Not on file    Active member of club or organization: Not on file    Attends meetings of clubs or organizations: Not on file    Relationship status: Not on file  . Intimate partner violence:    Fear of current or ex partner: Not on file    Emotionally abused: Not on file    Physically abused: Not on file    Forced sexual activity: Not on file  Other Topics Concern  . 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        FAMILY HISTORY: Family History  Problem Relation Age of Onset  . Thyroid disease Mother        hypothyroidism  . Allergies Mother   .  Parkinson's disease Mother        not definite  . Atrial fibrillation Mother   . Colitis Father   . Alcohol abuse Paternal Grandfather     ALLERGIES:  is allergic to cephalosporins; ciprofloxacin; iodinated diagnostic agents; omeprazole; penicillins; shrimp [shellfish allergy]; sulfa antibiotics; sulfonamide derivatives; and omnipaque [iohexol].  MEDICATIONS:  Current Outpatient Medications  Medication Sig Dispense Refill  . loratadine (CLARITIN) 10 MG tablet Take 10 mg daily as needed by mouth for allergies.    . mometasone (ELOCON) 0.1 % cream Apply 1 application topically daily as needed (for rash).    . Multiple Vitamins-Minerals (CENTRUM SILVER PO) Take by mouth.    Marland Kitchen  nitrofurantoin (MACRODANTIN) 100 MG capsule Take 100 mg by mouth 2 (two) times daily.  0  . Omega-3 Fatty Acids (FISH OIL) 1000 MG CAPS Take by mouth.    . sildenafil (REVATIO) 20 MG tablet Take by mouth.    . triamcinolone (NASACORT ALLERGY 24HR) 55 MCG/ACT AERO nasal inhaler Place 2 sprays into the nose daily.     No current facility-administered medications for this visit.     REVIEW OF SYSTEMS:    A 10+ POINT REVIEW OF SYSTEMS WAS OBTAINED including neurology, dermatology, psychiatry, cardiac, respiratory, lymph, extremities, GI, GU, Musculoskeletal, constitutional, breasts, reproductive, HEENT.  All pertinent positives are noted in the HPI.  All others are negative.   PHYSICAL EXAMINATION:  ECOG PERFORMANCE STATUS: 1 - Symptomatic but completely ambulatory  . Vitals:   08/28/17 1453  BP: (!) 132/93  Pulse: 70  Resp: 18  Temp: 98.1 F (36.7 C)  SpO2: 98%   Filed Weights   08/28/17 1453  Weight: 278 lb 6.4 oz (126.3 kg)   .Body mass index is 36.73 kg/m.  GENERAL:alert, in no acute distress and comfortable SKIN: no acute rashes, no significant lesions EYES: conjunctiva are pink and non-injected, sclera anicteric OROPHARYNX: MMM, no exudates, no oropharyngeal erythema or ulceration NECK: supple,  no JVD LYMPH:  no palpable lymphadenopathy in the cervical, axillary or inguinal regions LUNGS: clear to auscultation b/l with normal respiratory effort HEART: regular rate & rhythm ABDOMEN:  normoactive bowel sounds , non tender, not distended. No palpable hepatosplenomegaly.  Extremity: no pedal edema PSYCH: alert & oriented x 3 with fluent speech NEURO: no focal motor/sensory deficits   LABORATORY DATA:  I have reviewed the data as listed  . CBC Latest Ref Rng & Units 02/15/2017 02/14/2017 02/11/2017  WBC 4.0 - 10.5 K/uL - - 9.1  Hemoglobin 13.0 - 17.0 g/dL 12.3(L) 14.0 15.2  Hematocrit 39.0 - 52.0 % 36.0(L) 40.4 43.8  Platelets 150 - 400 K/uL - - 220    . CMP Latest Ref Rng & Units 02/11/2017 10/18/2015 08/31/2015  Glucose 65 - 99 mg/dL 90 - 82  BUN 6 - 20 mg/dL 19 - 19  Creatinine 0.61 - 1.24 mg/dL 0.89 - 1.06  Sodium 135 - 145 mmol/L 140 - 140  Potassium 3.5 - 5.1 mmol/L 4.7 - 4.5  Chloride 101 - 111 mmol/L 108 - 103  CO2 22 - 32 mmol/L 27 - 26  Calcium 8.9 - 10.3 mg/dL 9.3 - 9.4  Total Protein 6.0 - 8.5 g/dL - 6.8 -  Total Bilirubin 0.2 - 1.2 mg/dL - - -  Alkaline Phos 39 - 117 U/L - - -  AST 0 - 37 U/L - - -  ALT 0 - 53 U/L - - -   Component     Latest Ref Rng & Units 09/07/2015 10/18/2015  Sed Rate     0 - 20 mm/hr 3   CRP     0.5 - 20.0 mg/dL 0.9   LDH     125 - 245 U/L  156          RADIOGRAPHIC STUDIES: Microscopic Comment 1. PROSTATE RADICAL RESECTION/PROSTATECTOMY Procedure: Prostatectomy. Prostate gland: Weight: 58 grams Dimension: 4.8 cm from right to left, 4 cm from apex to base, 3.7 cm from anterior to posterior Histologic type: Prostatic adenocarcinoma, acinar type. Gleasons Score: 3+4=7 Tertiary score (if applicable): N/A. Involving (half lobe or less, one or both lobes): Both lobes. Estimated proportion (%) of prostate tissue involved by tumor: 20% Extraprostatic  extension: Present (Right anterior base, right posteriolateral mid to  base). Involvement of apex: Not identified. Margins: Negative. Seminal vesicles: Definitive invasion not identified. Lymph-Vascular invasion: Present. 1 of 3 FINAL for Campanaro, Aengus V [id number]) Microscopic Comment(continued) Treatment effect: N/A. Lymph nodes: number examined 11 ; number positive 2 TNM code: pT3a, pN1 Comments: None. Deeper levels were performed on select slides. Vicente Males MD Pathologist, Electronic Signature (Case signed 02/19/2017)   I have personally reviewed the radiological images as listed and agreed with the findings in the report. No results found.  ASSESSMENT & PLAN:   57 y.o. referred for possible paraproteinemia  1) Possible Paraproteinemia.  Patient has no Anemia, hypercalcemia, renal failure, bone pains. Patient's myeloma panel with SPEP and IFE shows no monoclonal paraproteinemia. Quantitative immunoglobulin levels are within normal limits. Serum kappa and lambda free light chains are within normal limits with a normal ratio. UPEP shows no evidence of a monoclonal protein within normal immunofixation pattern  PLAN: -Discussed that the patient's burning in his spine and bilateral legs is most likely a neurological problem -Recommend continuing follow up with PCP,and neurologist   2) pT3a, pN1 Stage IVA Prostate adenocarcinoma Gleasons 3+4  S/p radical prostatectomy with pathology as noted above PLAN: -he is following with Dr Alinda Money and Dr Tammi Klippel for f/u of consideration of adjuvant treatments. -he had detailed consideration of this treatment pending improvement in post surgical issues and till his son goes to college. -role for adjuvant radiation therapy or ADT therapy based on surgical pathology results were discussed. -he will f/u with rad onc and urology for final decision regarding these treatments -Continue follow-up with primary care physician for management of other comorbidities.   Plan:  -Continue follow up with [facility] -Follow up in the office PRN   RTC with Dr Irene Limbo as needed  All of the patients questions were answered with apparent satisfaction. The patient knows to call the clinic with any problems, questions or concerns.  The total time spent in the appt was 20 minutes and more than 50% was on counseling and direct patient cares.     Sullivan Lone MD MS AAHIVMS [facility] Hematology/Oncology Physician [facility]  (Office):       [phone] (Work cell):  [phone] (Fax):           [phone]  08/28/2017 3:50 PM   I, Baldwin Jamaica, am acting as a Education administrator for Dr Irene Limbo.   .I have reviewed the above documentation for accuracy and completeness, and I agree with the above. Brunetta Genera MD

## 2017-08-27 ENCOUNTER — Ambulatory Visit: Payer: Managed Care, Other (non HMO) | Admitting: Hematology

## 2017-08-28 ENCOUNTER — Encounter: Payer: Self-pay | Admitting: Hematology

## 2017-08-28 ENCOUNTER — Inpatient Hospital Stay: Payer: Managed Care, Other (non HMO) | Attending: Oncology | Admitting: Hematology

## 2017-08-28 VITALS — BP 132/93 | HR 70 | Temp 98.1°F | Resp 18 | Ht 73.0 in | Wt 278.4 lb

## 2017-08-28 DIAGNOSIS — C61 Malignant neoplasm of prostate: Secondary | ICD-10-CM | POA: Diagnosis present

## 2018-02-20 DIAGNOSIS — C61 Malignant neoplasm of prostate: Secondary | ICD-10-CM | POA: Diagnosis not present

## 2018-02-26 DIAGNOSIS — C61 Malignant neoplasm of prostate: Secondary | ICD-10-CM | POA: Diagnosis not present

## 2018-02-26 DIAGNOSIS — N393 Stress incontinence (female) (male): Secondary | ICD-10-CM | POA: Diagnosis not present

## 2018-02-26 DIAGNOSIS — N5201 Erectile dysfunction due to arterial insufficiency: Secondary | ICD-10-CM | POA: Diagnosis not present

## 2018-03-17 DIAGNOSIS — M1812 Unilateral primary osteoarthritis of first carpometacarpal joint, left hand: Secondary | ICD-10-CM | POA: Diagnosis not present

## 2018-04-30 DIAGNOSIS — G4733 Obstructive sleep apnea (adult) (pediatric): Secondary | ICD-10-CM | POA: Diagnosis not present

## 2018-07-30 DIAGNOSIS — Z8546 Personal history of malignant neoplasm of prostate: Secondary | ICD-10-CM | POA: Diagnosis not present

## 2018-07-30 DIAGNOSIS — Z0000 Encounter for general adult medical examination without abnormal findings: Secondary | ICD-10-CM | POA: Diagnosis not present

## 2018-07-30 DIAGNOSIS — Z1159 Encounter for screening for other viral diseases: Secondary | ICD-10-CM | POA: Diagnosis not present

## 2018-07-30 DIAGNOSIS — Z6841 Body Mass Index (BMI) 40.0 and over, adult: Secondary | ICD-10-CM | POA: Diagnosis not present

## 2018-07-30 DIAGNOSIS — G4733 Obstructive sleep apnea (adult) (pediatric): Secondary | ICD-10-CM | POA: Diagnosis not present

## 2018-07-30 DIAGNOSIS — Z13220 Encounter for screening for lipoid disorders: Secondary | ICD-10-CM | POA: Diagnosis not present

## 2018-07-31 DIAGNOSIS — C61 Malignant neoplasm of prostate: Secondary | ICD-10-CM | POA: Diagnosis not present

## 2018-08-04 DIAGNOSIS — R739 Hyperglycemia, unspecified: Secondary | ICD-10-CM | POA: Diagnosis not present

## 2018-08-05 DIAGNOSIS — G4733 Obstructive sleep apnea (adult) (pediatric): Secondary | ICD-10-CM | POA: Diagnosis not present

## 2018-09-04 DIAGNOSIS — G4733 Obstructive sleep apnea (adult) (pediatric): Secondary | ICD-10-CM | POA: Diagnosis not present

## 2018-10-05 DIAGNOSIS — G4733 Obstructive sleep apnea (adult) (pediatric): Secondary | ICD-10-CM | POA: Diagnosis not present

## 2018-11-05 DIAGNOSIS — G4733 Obstructive sleep apnea (adult) (pediatric): Secondary | ICD-10-CM | POA: Diagnosis not present

## 2018-11-06 DIAGNOSIS — F419 Anxiety disorder, unspecified: Secondary | ICD-10-CM | POA: Diagnosis not present

## 2018-11-06 DIAGNOSIS — Z192 Hormone resistant malignancy status: Secondary | ICD-10-CM | POA: Diagnosis not present

## 2018-11-06 DIAGNOSIS — C61 Malignant neoplasm of prostate: Secondary | ICD-10-CM | POA: Diagnosis not present

## 2018-11-21 DIAGNOSIS — G4733 Obstructive sleep apnea (adult) (pediatric): Secondary | ICD-10-CM | POA: Diagnosis not present

## 2018-11-26 DIAGNOSIS — K6289 Other specified diseases of anus and rectum: Secondary | ICD-10-CM | POA: Diagnosis not present

## 2018-11-26 DIAGNOSIS — K645 Perianal venous thrombosis: Secondary | ICD-10-CM | POA: Diagnosis not present

## 2018-12-05 DIAGNOSIS — G4733 Obstructive sleep apnea (adult) (pediatric): Secondary | ICD-10-CM | POA: Diagnosis not present

## 2019-01-05 DIAGNOSIS — G4733 Obstructive sleep apnea (adult) (pediatric): Secondary | ICD-10-CM | POA: Diagnosis not present

## 2019-02-18 DIAGNOSIS — G4733 Obstructive sleep apnea (adult) (pediatric): Secondary | ICD-10-CM | POA: Diagnosis not present

## 2019-03-03 DIAGNOSIS — C61 Malignant neoplasm of prostate: Secondary | ICD-10-CM | POA: Diagnosis not present

## 2019-03-06 DIAGNOSIS — C61 Malignant neoplasm of prostate: Secondary | ICD-10-CM | POA: Diagnosis not present

## 2019-03-06 DIAGNOSIS — N393 Stress incontinence (female) (male): Secondary | ICD-10-CM | POA: Diagnosis not present

## 2019-03-06 DIAGNOSIS — N5201 Erectile dysfunction due to arterial insufficiency: Secondary | ICD-10-CM | POA: Diagnosis not present

## 2019-03-06 DIAGNOSIS — C775 Secondary and unspecified malignant neoplasm of intrapelvic lymph nodes: Secondary | ICD-10-CM | POA: Diagnosis not present

## 2019-03-17 DIAGNOSIS — K432 Incisional hernia without obstruction or gangrene: Secondary | ICD-10-CM | POA: Diagnosis not present

## 2019-03-17 DIAGNOSIS — R1010 Upper abdominal pain, unspecified: Secondary | ICD-10-CM | POA: Diagnosis not present

## 2019-03-17 DIAGNOSIS — K229 Disease of esophagus, unspecified: Secondary | ICD-10-CM | POA: Diagnosis not present

## 2019-04-22 DIAGNOSIS — Z1159 Encounter for screening for other viral diseases: Secondary | ICD-10-CM | POA: Diagnosis not present

## 2019-04-27 DIAGNOSIS — K2970 Gastritis, unspecified, without bleeding: Secondary | ICD-10-CM | POA: Diagnosis not present

## 2019-04-27 DIAGNOSIS — R1010 Upper abdominal pain, unspecified: Secondary | ICD-10-CM | POA: Diagnosis not present

## 2019-05-20 DIAGNOSIS — G4733 Obstructive sleep apnea (adult) (pediatric): Secondary | ICD-10-CM | POA: Diagnosis not present

## 2019-06-09 ENCOUNTER — Other Ambulatory Visit: Payer: Self-pay | Admitting: Surgery

## 2019-06-09 DIAGNOSIS — K430 Incisional hernia with obstruction, without gangrene: Secondary | ICD-10-CM | POA: Diagnosis not present

## 2019-06-11 ENCOUNTER — Other Ambulatory Visit: Payer: Self-pay | Admitting: Surgery

## 2019-06-11 DIAGNOSIS — K430 Incisional hernia with obstruction, without gangrene: Secondary | ICD-10-CM

## 2019-06-23 DIAGNOSIS — K439 Ventral hernia without obstruction or gangrene: Secondary | ICD-10-CM | POA: Diagnosis not present

## 2019-06-23 DIAGNOSIS — K8020 Calculus of gallbladder without cholecystitis without obstruction: Secondary | ICD-10-CM | POA: Diagnosis not present

## 2019-07-14 DIAGNOSIS — H21341 Primary cyst of pars plana, right eye: Secondary | ICD-10-CM | POA: Diagnosis not present

## 2019-07-14 DIAGNOSIS — H43813 Vitreous degeneration, bilateral: Secondary | ICD-10-CM | POA: Diagnosis not present

## 2019-07-14 DIAGNOSIS — H43391 Other vitreous opacities, right eye: Secondary | ICD-10-CM | POA: Diagnosis not present

## 2019-07-14 DIAGNOSIS — H35431 Paving stone degeneration of retina, right eye: Secondary | ICD-10-CM | POA: Diagnosis not present

## 2019-08-04 DIAGNOSIS — R739 Hyperglycemia, unspecified: Secondary | ICD-10-CM | POA: Diagnosis not present

## 2019-08-04 DIAGNOSIS — Z23 Encounter for immunization: Secondary | ICD-10-CM | POA: Diagnosis not present

## 2019-08-04 DIAGNOSIS — Z0000 Encounter for general adult medical examination without abnormal findings: Secondary | ICD-10-CM | POA: Diagnosis not present

## 2019-08-04 DIAGNOSIS — F419 Anxiety disorder, unspecified: Secondary | ICD-10-CM | POA: Diagnosis not present

## 2019-08-04 DIAGNOSIS — Z8546 Personal history of malignant neoplasm of prostate: Secondary | ICD-10-CM | POA: Diagnosis not present

## 2019-08-04 DIAGNOSIS — G4733 Obstructive sleep apnea (adult) (pediatric): Secondary | ICD-10-CM | POA: Diagnosis not present

## 2019-08-04 DIAGNOSIS — R5383 Other fatigue: Secondary | ICD-10-CM | POA: Diagnosis not present

## 2019-08-11 DIAGNOSIS — E291 Testicular hypofunction: Secondary | ICD-10-CM | POA: Diagnosis not present

## 2019-08-19 DIAGNOSIS — G4733 Obstructive sleep apnea (adult) (pediatric): Secondary | ICD-10-CM | POA: Diagnosis not present

## 2019-09-14 DIAGNOSIS — C61 Malignant neoplasm of prostate: Secondary | ICD-10-CM | POA: Diagnosis not present

## 2019-09-22 DIAGNOSIS — C775 Secondary and unspecified malignant neoplasm of intrapelvic lymph nodes: Secondary | ICD-10-CM | POA: Diagnosis not present

## 2019-09-22 DIAGNOSIS — C61 Malignant neoplasm of prostate: Secondary | ICD-10-CM | POA: Diagnosis not present

## 2019-09-22 DIAGNOSIS — N481 Balanitis: Secondary | ICD-10-CM | POA: Diagnosis not present

## 2019-09-22 DIAGNOSIS — N5201 Erectile dysfunction due to arterial insufficiency: Secondary | ICD-10-CM | POA: Diagnosis not present

## 2019-10-01 DIAGNOSIS — F419 Anxiety disorder, unspecified: Secondary | ICD-10-CM | POA: Diagnosis not present

## 2019-10-01 DIAGNOSIS — Z23 Encounter for immunization: Secondary | ICD-10-CM | POA: Diagnosis not present

## 2019-11-05 DIAGNOSIS — F418 Other specified anxiety disorders: Secondary | ICD-10-CM | POA: Diagnosis not present

## 2019-11-23 DIAGNOSIS — G4733 Obstructive sleep apnea (adult) (pediatric): Secondary | ICD-10-CM | POA: Diagnosis not present

## 2019-12-25 ENCOUNTER — Other Ambulatory Visit: Payer: Self-pay | Admitting: Student

## 2019-12-30 DIAGNOSIS — R300 Dysuria: Secondary | ICD-10-CM | POA: Diagnosis not present

## 2019-12-30 DIAGNOSIS — M546 Pain in thoracic spine: Secondary | ICD-10-CM | POA: Diagnosis not present

## 2019-12-30 DIAGNOSIS — R7303 Prediabetes: Secondary | ICD-10-CM | POA: Diagnosis not present

## 2019-12-30 DIAGNOSIS — Z79899 Other long term (current) drug therapy: Secondary | ICD-10-CM | POA: Diagnosis not present

## 2020-02-18 DIAGNOSIS — G4733 Obstructive sleep apnea (adult) (pediatric): Secondary | ICD-10-CM | POA: Diagnosis not present

## 2020-03-29 DIAGNOSIS — C61 Malignant neoplasm of prostate: Secondary | ICD-10-CM | POA: Diagnosis not present

## 2020-04-05 DIAGNOSIS — N481 Balanitis: Secondary | ICD-10-CM | POA: Diagnosis not present

## 2020-04-05 DIAGNOSIS — C775 Secondary and unspecified malignant neoplasm of intrapelvic lymph nodes: Secondary | ICD-10-CM | POA: Diagnosis not present

## 2020-04-05 DIAGNOSIS — N5201 Erectile dysfunction due to arterial insufficiency: Secondary | ICD-10-CM | POA: Diagnosis not present

## 2020-04-05 DIAGNOSIS — C61 Malignant neoplasm of prostate: Secondary | ICD-10-CM | POA: Diagnosis not present

## 2020-05-23 DIAGNOSIS — F418 Other specified anxiety disorders: Secondary | ICD-10-CM | POA: Diagnosis not present

## 2020-05-23 DIAGNOSIS — F4321 Adjustment disorder with depressed mood: Secondary | ICD-10-CM | POA: Diagnosis not present

## 2020-05-27 DIAGNOSIS — G4733 Obstructive sleep apnea (adult) (pediatric): Secondary | ICD-10-CM | POA: Diagnosis not present

## 2020-06-01 DIAGNOSIS — M19042 Primary osteoarthritis, left hand: Secondary | ICD-10-CM | POA: Diagnosis not present

## 2020-06-09 DIAGNOSIS — M25642 Stiffness of left hand, not elsewhere classified: Secondary | ICD-10-CM | POA: Diagnosis not present

## 2020-06-09 DIAGNOSIS — M150 Primary generalized (osteo)arthritis: Secondary | ICD-10-CM | POA: Diagnosis not present

## 2020-06-28 ENCOUNTER — Other Ambulatory Visit: Payer: Self-pay | Admitting: Surgery

## 2020-06-28 DIAGNOSIS — K432 Incisional hernia without obstruction or gangrene: Secondary | ICD-10-CM | POA: Diagnosis not present

## 2020-06-28 DIAGNOSIS — M19042 Primary osteoarthritis, left hand: Secondary | ICD-10-CM | POA: Diagnosis not present

## 2020-07-06 NOTE — Patient Instructions (Addendum)
DUE TO COVID-19 ONLY ONE VISITOR IS ALLOWED TO COME WITH YOU AND STAY IN THE WAITING ROOM ONLY DURING PRE OP AND PROCEDURE DAY OF SURGERY. THE 2 VISITORs  MAY VISIT WITH YOU AFTER SURGERY IN YOUR PRIVATE ROOM DURING VISITING HOURS ONLY!  YOU NEED TO HAVE A COVID 19 TEST ON__6/9_ @_0900__ , THIS TEST MUST BE DONE BEFORE SURGERY,  COVID TESTING SITE [place] [address], IT IS ON THE RIGHT GOING OUT [address] APPROXIMATELY  2 MINUTES PAST [employer] ON THE RIGHT. ONCE YOUR COVID TEST IS COMPLETED,  PLEASE BEGIN THE QUARANTINE INSTRUCTIONS AS OUTLINED IN YOUR HANDOUT.                Russell Thomas    Your procedure is scheduled on: 07/18/20   Report to [facility] Main  Entrance   Report to Short Stay at 5:15 AM     Call this number if you have problems the morning of surgery [place], NO CHEWING GUM [place].   No food after midnight.    You may have clear liquid until 4:30 AM.    _____________________________________________________________________     CLEAR LIQUID DIET   Foods Allowed                                                                     Foods Excluded  Coffee and tea, regular and decaf                             liquids that you cannot  Plain Jell-O any favor except red or purple                                           see through such as: Fruit ices (not with fruit pulp)                                     milk, soups, orange juice  Iced Popsicles                                    All solid food Carbonated beverages, regular and diet                                    Cranberry, grape and apple juices Sports drinks like Gatorade Lightly seasoned clear broth or consume(fat free) Sugar, honey syrup  Sample Menu Breakfast                                Lunch  Supper Cranberry juice                     Beef broth                            Chicken broth Jell-O                                     Grape juice                           Apple juice Coffee or tea                        Jell-O                                      Popsicle                                                Coffee or tea                        Coffee or tea  _____________________________________________________________________      At 4:00 AM drink pre surgery drink.   Nothing by mouth after 4:30 AM.   Take these medicines the morning of surgery with A SIP OF WATER: Wellbutrin, Claritin, use your eye drops. Bring your mask and tubing if you have sleep apnea and use a C-Pap                                 You may not have any metal on your body including               piercings  Do not wear jewelry, lotions, powders or deodorant                        Men may shave face and neck.   Do not bring valuables to the hospital. [place].  Contacts, dentures or bridgework may not be worn into surgery.       Patients discharged the day of surgery will not be allowed to drive home.   IF YOU ARE HAVING SURGERY AND GOING HOME THE SAME DAY, YOU MUST HAVE AN ADULT TO DRIVE YOU HOME AND BE WITH YOU FOR 24 HOURS. YOU MAY GO HOME BY TAXI OR UBER OR ORTHERWISE, BUT AN ADULT MUST ACCOMPANY YOU HOME AND STAY WITH YOU FOR 24 HOURS.  Name and phone number of your driver:  Special Instructions: N/A              Please read over the following fact sheets you were given: _____________________________________________________________________             [facility] - Preparing for Surgery Before surgery, you can play an important role.  Because skin is not sterile, your skin needs to be as  free of germs as possible.  You can reduce the number of germs on your skin by washing with CHG (chlorahexidine gluconate) soap before surgery.  CHG is an antiseptic cleaner which kills germs  and bonds with the skin to continue killing germs even after washing. Please DO NOT use if you have an allergy to CHG or antibacterial soaps.  If your skin becomes reddened/irritated stop using the CHG and inform your nurse when you arrive at Short Stay. .  You may shave your face/neck.  Please follow these instructions carefully:  1.  Shower with CHG Soap the night before surgery and the  morning of Surgery.  2.  If you choose to wash your hair, wash your hair first as usual with your  normal  shampoo.  3.  After you shampoo, rinse your hair and body thoroughly to remove the  shampoo.                                        4.  Use CHG as you would any other liquid soap.  You can apply chg directly  to the skin and wash                       Gently with a scrungie or clean washcloth.  5.  Apply the CHG Soap to your body ONLY FROM THE NECK DOWN.   Do not use on face/ open                           Wound or open sores. Avoid contact with eyes, ears mouth and genitals (private parts).                       Wash face,  Genitals (private parts) with your normal soap.             6.  Wash thoroughly, paying special attention to the area where your surgery  will be performed.  7.  Thoroughly rinse your body with warm water from the neck down.  8.  DO NOT shower/wash with your normal soap after using and rinsing off  the CHG Soap.             9.  Pat yourself dry with a clean towel.            10.  Wear clean pajamas.            11.  Place clean sheets on your bed the night of your first shower and do not  sleep with pets. Day of Surgery : Do not apply any lotions/deodorants the morning of surgery.  Please wear clean clothes to the hospital/surgery center.  FAILURE TO FOLLOW THESE INSTRUCTIONS MAY RESULT IN THE CANCELLATION OF YOUR SURGERY PATIENT SIGNATURE_________________________________  NURSE  SIGNATURE__________________________________  ________________________________________________________________________

## 2020-07-13 ENCOUNTER — Other Ambulatory Visit: Payer: Self-pay

## 2020-07-13 ENCOUNTER — Encounter (HOSPITAL_COMMUNITY)
Admission: RE | Admit: 2020-07-13 | Discharge: 2020-07-13 | Disposition: A | Discharge status: Home / Self Care | Payer: BC Managed Care – PPO | Source: Ambulatory Visit | Attending: Surgery | Admitting: Surgery

## 2020-07-13 ENCOUNTER — Encounter (HOSPITAL_COMMUNITY): Payer: Self-pay

## 2020-07-13 DIAGNOSIS — Z01818 Encounter for other preprocedural examination: Secondary | ICD-10-CM | POA: Insufficient documentation

## 2020-07-13 LAB — CBC
HCT: 47.4 % (ref 39.0–52.0)
Hemoglobin: 16 g/dL (ref 13.0–17.0)
MCH: 31.3 pg (ref 26.0–34.0)
MCHC: 33.8 g/dL (ref 30.0–36.0)
MCV: 92.8 fL (ref 80.0–100.0)
Platelets: 221 10*3/uL (ref 150–400)
RBC: 5.11 MIL/uL (ref 4.22–5.81)
RDW: 13.2 % (ref 11.5–15.5)
WBC: 10.1 10*3/uL (ref 4.0–10.5)
nRBC: 0 % (ref 0.0–0.2)

## 2020-07-13 LAB — BASIC METABOLIC PANEL
Anion gap: 7 (ref 5–15)
BUN: 17 mg/dL (ref 8–23)
CO2: 24 mmol/L (ref 22–32)
Calcium: 8.9 mg/dL (ref 8.9–10.3)
Chloride: 108 mmol/L (ref 98–111)
Creatinine, Ser: 1.05 mg/dL (ref 0.61–1.24)
GFR, Estimated: >60 mL/min (ref >60)
Glucose, Bld: 98 mg/dL (ref 70–99)
Potassium: 4.1 mmol/L (ref 3.5–5.1)
Sodium: 139 mmol/L (ref 135–145)

## 2020-07-13 LAB — GLUCOSE, CAPILLARY: Glucose-Capillary: 103 mg/dL — ABNORMAL HIGH (ref 70–99)

## 2020-07-13 NOTE — Progress Notes (Signed)
Pt was concerned in regards to bowel prep for surgery. Informed pt no orders in epic for bowel prep and pt states has not been given. Pt requested for surgeon's office to be contacted in regards to this. Nurse spoke with Abigail Butts (triage) with [facility] whom stated there is no ordered prep for this type of surgery.   PCP Dr Lavone Orn  EKG/epic preformed today 07/13/2020   Patient has history of sleep apnea and does use CPAP but did not know pressure settings; pt aware to bring mask and tubing with him day of scheduled procedure.   Pt is prediabetic and states does not check cbg's   Capillary glucose 07/13/2020 during PAT visit 103  Patient denies shortness of breath, fever, cough, and chest pain at PAT appointment  Patient verbalized understanding of instructions that were given to them at the PAT appointment. Patient was also instructed that they will need to review over the PAT instructions again at home before surgery.

## 2020-07-14 ENCOUNTER — Other Ambulatory Visit (HOSPITAL_COMMUNITY)
Admission: RE | Admit: 2020-07-14 | Discharge: 2020-07-14 | Disposition: A | Discharge status: Home / Self Care | Payer: BC Managed Care – PPO | Source: Ambulatory Visit | Attending: Surgery | Admitting: Surgery

## 2020-07-14 ENCOUNTER — Other Ambulatory Visit (HOSPITAL_COMMUNITY): Payer: BC Managed Care – PPO

## 2020-07-14 DIAGNOSIS — Z01812 Encounter for preprocedural laboratory examination: Secondary | ICD-10-CM | POA: Insufficient documentation

## 2020-07-14 DIAGNOSIS — Z20822 Contact with and (suspected) exposure to covid-19: Secondary | ICD-10-CM | POA: Diagnosis not present

## 2020-07-14 LAB — HEMOGLOBIN A1C
Hgb A1c MFr Bld: 5.7 % — ABNORMAL HIGH (ref 4.8–5.6)
Mean Plasma Glucose: 117 mg/dL

## 2020-07-14 LAB — SARS CORONAVIRUS 2 (TAT 6-24 HRS): SARS Coronavirus 2: NEGATIVE

## 2020-07-15 ENCOUNTER — Encounter (HOSPITAL_COMMUNITY): Payer: Self-pay | Admitting: Surgery

## 2020-07-15 NOTE — Anesthesia Preprocedure Evaluation (Addendum)
Anesthesia Evaluation  Patient identified by MRN, date of birth, ID band Patient awake    Reviewed: Allergy & Precautions, NPO status , Patient's Chart, lab work & pertinent test results, reviewed documented beta blocker date and time   Airway Mallampati: III  TM Distance: >3 FB Neck ROM: Full    Dental no notable dental hx.    Pulmonary sleep apnea and Continuous Positive Airway Pressure Ventilation , former smoker,    Pulmonary exam normal breath sounds clear to auscultation       Cardiovascular hypertension, Normal cardiovascular exam Rhythm:Regular Rate:Normal  EKG 07/13/20 NSR, LVH  Echo 04/13/13 Left ventricle: The cavity size was normal. Wall thickness  was normal. Systolic function was normal. The estimated ejection fraction was in the range of 55% to 60%. Wall motion was normal; there were no regional wall motionabnormalities. Doppler parameters are consistent with abnormal left ventricular relaxation (grade 1 diastolic dysfunction).  - Aortic valve: There was no stenosis. Trivial  regurgitation.  - Mitral valve: Trivial regurgitation.  - Left atrium: The atrium was mildly dilated.  - Right ventricle: The cavity size was normal. Systolic  function was normal.  - Right atrium: The atrium was mildly dilated.  - Pulmonary arteries: No complete TR doppler jet so unableto estimate PA systolic pressure.  - Inferior vena cava: The vessel was normal in size; the  respirophasic diameter changes were in the normal range (= 50%); findings are consistent with normal central venous pressure.    Neuro/Psych Anxiety Left shoulder and back pain with paresthesias- chronic    GI/Hepatic Neg liver ROS, GERD  Medicated and Controlled,  Endo/Other  Obesity Hyperlipidemia Prediabetes- on no Rx, last HbA1c- 5.7  Renal/GU negative Renal ROS   Hx/o Prostate Ca S/P prostatectomy    Musculoskeletal Incisional hernia    Abdominal (+) + obese,   Peds  Hematology negative hematology ROS (+)   Anesthesia Other Findings   Reproductive/Obstetrics                           Anesthesia Physical Anesthesia Plan  ASA: 3  Anesthesia Plan: General   Post-op Pain Management:    Induction: Intravenous  PONV Risk Score and Plan: 4 or greater and Treatment may vary due to age or medical condition, Ondansetron and Midazolam  Airway Management Planned: Oral ETT  Additional Equipment:   Intra-op Plan:   Post-operative Plan: Extubation in OR  Informed Consent: I have reviewed the patients History and Physical, chart, labs and discussed the procedure including the risks, benefits and alternatives for the proposed anesthesia with the patient or authorized representative who has indicated his/her understanding and acceptance.     Dental advisory given  Plan Discussed with: CRNA and Anesthesiologist  Anesthesia Plan Comments:         Anesthesia Quick Evaluation

## 2020-07-17 MED ORDER — VANCOMYCIN HCL 1500 MG/300ML IV SOLN
1500.0000 mg | INTRAVENOUS | Status: AC
  Administered 2020-07-18: 1500 mg via INTRAVENOUS
  Filled 2020-07-17 (×2): qty 300

## 2020-07-17 NOTE — H&P (Signed)
Russell Thomas Appointment: 06/28/2020 11:30 AM Location: [facility] Patient #: [id number] DOB: Jul 16, 1960 Married / Language: Russell Thomas / Race: White Male   History of Present Illness (Russell Thomas A. Ninfa Linden MD; 06/28/2020 11:54 AM) The patient is a 60 year old male who presents with an incisional hernia.  Chief complaint: Incisional hernia  This is a 60 year old gentleman I saw last year with an incisional hernia from his robotic prostatectomy at the incision above the umbilicus.  We got a CAT scan last year showing small bowel in the hernia.  The patient decided to hold off on surgery given the Covid surg last year.  He is now having increasing symptoms from the hernia and further protrusion in the abdominal wall from the hernia.  He is having discomfort and constipation.  Past Surgical History   Colon Polyp Removal - Colonoscopy   Oral Surgery   Prostate Surgery - Removal   Sentinel Lymph Node Biopsy   Tonsillectomy   Vasectomy     Allergies Russell Thomas, Thomas; 06/28/2020 11:34 AM) Omnipaque *DIAGNOSTIC PRODUCTS*   Penicillins   Sulfa Antibiotics   Cipro *FLUOROQUINOLONES*   Cephalosporins   Allergies Reconciled    Medication History Russell Thomas, Thomas; 06/28/2020 11:35 AM) LORazepam  (1MG  Tablet, Oral) Active. buPROPion HCl ER (XL)  (300MG  Tablet ER 24HR, Oral) Active. Clotrimazole-Betamethasone  (1-0.05% Cream, External) Active. Sertraline HCl  (100MG  Tablet, Oral) Active. Sertraline HCl  (50MG  Tablet, Oral) Active. Meloxicam  (15MG  Tablet, Oral) Active. Claritin  (Oral) Specific strength unknown - Active. Medications Reconciled    Social History Leisure centre manager, Thomas;  Alcohol use   Occasional alcohol use. Caffeine use   Carbonated beverages, Coffee, Tea. No drug use   Tobacco use   Never smoker.  Family History Russell Thomas, Russell Thomas; 06/09/2019 3:45 PM) Arthritis   Mother. Heart Disease   Mother. Thyroid problems   Mother.  Other Problems (Russell Thomas, Thomas;   Anxiety Disorder   Cholelithiasis   Prostate Cancer   Sleep Apnea   Umbilical Hernia Repair      Review of Systems (Russell Thomas; 06/09/2019 3:45 PM) General Present- Weight Gain. Not Present- Appetite Loss, Chills, Fatigue, Fever, Night Sweats and Weight Loss. Skin Not Present- Change in Wart/Mole, Dryness, Hives, Jaundice, New Lesions, Non-Healing Wounds, Rash and Ulcer. HEENT Present- Seasonal Allergies, Visual Disturbances and Wears glasses/contact lenses. Not Present- Earache, Hearing Loss, Hoarseness, Nose Bleed, Oral Ulcers, Ringing in the Ears, Sinus Pain, Sore Throat and Yellow Eyes. Breast Not Present- Breast Mass, Breast Pain, Nipple Discharge and Skin Changes. Cardiovascular Not Present- Chest Pain, Difficulty Breathing Lying Down, Leg Cramps, Palpitations, Rapid Heart Rate, Shortness of Breath and Swelling of Extremities. Gastrointestinal Present- Abdominal Pain, Bloating and Constipation. Not Present- Bloody Stool, Change in Bowel Habits, Chronic diarrhea, Difficulty Swallowing, Excessive gas, Gets full quickly at meals, Hemorrhoids, Indigestion, Nausea, Rectal Pain and Vomiting. Male Genitourinary Present- Urgency. Not Present- Blood in Urine, Change in Urinary Stream, Frequency, Impotence, Nocturia, Painful Urination and Urine Leakage. Musculoskeletal Not Present- Back Pain, Joint Pain, Joint Stiffness, Muscle Pain, Muscle Weakness and Swelling of Extremities. Neurological Not Present- Decreased Memory, Fainting, Headaches, Numbness, Seizures, Tingling, Tremor, Trouble walking and Weakness. Psychiatric Present- Anxiety. Not Present- Bipolar, Change in Sleep Pattern, Depression, Fearful and Frequent crying. Endocrine Not Present- Cold Intolerance, Excessive Hunger, Hair Changes, Heat Intolerance, Hot flashes and New Diabetes. Hematology Not Present- Blood Thinners, Easy Bruising, Excessive bleeding, Gland problems, HIV and Persistent Infections.  Vitals (Ch Vitals (Joann Hassell Done  Thomas; 06/28/2020 11:34 AM) 06/28/2020 11:33 AM Weight: 289.25 lb   Height: 72 in  Body Surface Area: 2.49 m   Body Mass Index: 39.23 kg/m   Temp.: 98.3 F    Pulse: 113 (Regular)    P.OX: 95% (Room air) BP: 124/80(Sitting, Left Arm, Standard)       Physical Exam (Shanequia Kendrick A. Ninfa Linden MD; 06/28/2020 11:55 AM) The physical exam findings are as follows: Note:  It appears palpable on exam  The fascial defect above the umbilicus is much larger. I am able to reduce the contents. It is nontender.  Lungs clear CV RRR Neuro grossly intact Skin without rash    Assessment & Plan  INCISIONAL HERNIA (K43.2)  Impression: We again had discussion regarding incisional hernias. His hernia is definitely larger and more symptomatic. Urgent repair with mesh is strongly recommended. We discussed the possibility of increasing and the size of the hernia and strangulation without surgery. We next discussed hernia repair mesh including the open and laparoscopic techniques. After discussion, we will proceed with a laparoscopic incisional hernia repair with mesh. I discussed the risks includes but is not limited to bleeding, infection, injury to surrounding structures, the need to convert to an open procedure, use of mesh, hernia recurrence, cardiopulmonary issues, postoperative recovery, etc. He understands and surgery will be scheduled urgently

## 2020-07-18 ENCOUNTER — Encounter (HOSPITAL_COMMUNITY): Payer: Self-pay | Admitting: Surgery

## 2020-07-18 ENCOUNTER — Ambulatory Visit (HOSPITAL_COMMUNITY): Payer: BC Managed Care – PPO | Admitting: Anesthesiology

## 2020-07-18 ENCOUNTER — Observation Stay (HOSPITAL_COMMUNITY)
Admission: RE | Admit: 2020-07-18 | Discharge: 2020-07-19 | Disposition: A | Discharge status: Home / Self Care | Payer: BC Managed Care – PPO | Attending: Surgery | Admitting: Surgery

## 2020-07-18 ENCOUNTER — Other Ambulatory Visit: Payer: Self-pay

## 2020-07-18 ENCOUNTER — Encounter (HOSPITAL_COMMUNITY)
Admission: RE | Disposition: A | Discharge status: Home / Self Care | Payer: Self-pay | Source: Home / Self Care | Attending: Surgery

## 2020-07-18 DIAGNOSIS — Z8546 Personal history of malignant neoplasm of prostate: Secondary | ICD-10-CM | POA: Insufficient documentation

## 2020-07-18 DIAGNOSIS — I10 Essential (primary) hypertension: Secondary | ICD-10-CM | POA: Diagnosis not present

## 2020-07-18 DIAGNOSIS — R7303 Prediabetes: Secondary | ICD-10-CM | POA: Diagnosis not present

## 2020-07-18 DIAGNOSIS — K432 Incisional hernia without obstruction or gangrene: Secondary | ICD-10-CM | POA: Diagnosis not present

## 2020-07-18 DIAGNOSIS — E785 Hyperlipidemia, unspecified: Secondary | ICD-10-CM | POA: Diagnosis not present

## 2020-07-18 DIAGNOSIS — Z8719 Personal history of other diseases of the digestive system: Secondary | ICD-10-CM

## 2020-07-18 DIAGNOSIS — E119 Type 2 diabetes mellitus without complications: Secondary | ICD-10-CM | POA: Insufficient documentation

## 2020-07-18 HISTORY — PX: INCISIONAL HERNIA REPAIR: SHX193

## 2020-07-18 LAB — GLUCOSE, CAPILLARY: Glucose-Capillary: 104 mg/dL — ABNORMAL HIGH (ref 70–99)

## 2020-07-18 SURGERY — REPAIR, HERNIA, INCISIONAL, LAPAROSCOPIC
Anesthesia: General

## 2020-07-18 MED ORDER — CHLORHEXIDINE GLUCONATE CLOTH 2 % EX PADS: 6.0000 | MEDICATED_PAD | Freq: Once | CUTANEOUS | Status: DC

## 2020-07-18 MED ORDER — METHOCARBAMOL 500 MG PO TABS
500.0000 mg | ORAL_TABLET | Freq: Four times a day (QID) | ORAL | Status: DC | PRN
  Administered 2020-07-19: 500 mg via ORAL
  Filled 2020-07-18: qty 1

## 2020-07-18 MED ORDER — FENTANYL CITRATE (PF) 250 MCG/5ML IJ SOLN
INTRAMUSCULAR | Status: DC | PRN
  Administered 2020-07-18 (×2): 50 ug via INTRAVENOUS
  Administered 2020-07-18: 150 ug via INTRAVENOUS

## 2020-07-18 MED ORDER — TRAMADOL HCL 50 MG PO TABS: 50.0000 mg | ORAL_TABLET | Freq: Four times a day (QID) | ORAL | Status: DC | PRN

## 2020-07-18 MED ORDER — ONDANSETRON 4 MG PO TBDP: 4.0000 mg | ORAL_TABLET | Freq: Four times a day (QID) | ORAL | Status: DC | PRN

## 2020-07-18 MED ORDER — CHLORHEXIDINE GLUCONATE 0.12 % MT SOLN
15.0000 mL | Freq: Once | OROMUCOSAL | Status: AC
  Administered 2020-07-18: 15 mL via OROMUCOSAL

## 2020-07-18 MED ORDER — MIDAZOLAM HCL 2 MG/2ML IJ SOLN
INTRAMUSCULAR | Status: DC | PRN
  Administered 2020-07-18: 2 mg via INTRAVENOUS

## 2020-07-18 MED ORDER — ROCURONIUM BROMIDE 10 MG/ML (PF) SYRINGE
PREFILLED_SYRINGE | INTRAVENOUS | Status: AC
  Filled 2020-07-18: qty 10

## 2020-07-18 MED ORDER — PROPOFOL 10 MG/ML IV BOLUS
INTRAVENOUS | Status: AC
  Filled 2020-07-18: qty 20

## 2020-07-18 MED ORDER — BUPIVACAINE-EPINEPHRINE (PF) 0.25% -1:200000 IJ SOLN
INTRAMUSCULAR | Status: AC
  Filled 2020-07-18: qty 30

## 2020-07-18 MED ORDER — LIDOCAINE 2% (20 MG/ML) 5 ML SYRINGE
INTRAMUSCULAR | Status: AC
  Filled 2020-07-18: qty 5

## 2020-07-18 MED ORDER — SUGAMMADEX SODIUM 200 MG/2ML IV SOLN
INTRAVENOUS | Status: DC | PRN
  Administered 2020-07-18: 300 mg via INTRAVENOUS

## 2020-07-18 MED ORDER — MIDAZOLAM HCL 2 MG/2ML IJ SOLN
INTRAMUSCULAR | Status: AC
  Filled 2020-07-18: qty 2

## 2020-07-18 MED ORDER — ACETAMINOPHEN 500 MG PO TABS
1000.0000 mg | ORAL_TABLET | ORAL | Status: AC
  Administered 2020-07-18: 1000 mg via ORAL
  Filled 2020-07-18: qty 2

## 2020-07-18 MED ORDER — LIDOCAINE 2% (20 MG/ML) 5 ML SYRINGE
INTRAMUSCULAR | Status: DC | PRN
  Administered 2020-07-18: 100 mg via INTRAVENOUS

## 2020-07-18 MED ORDER — ONDANSETRON HCL 4 MG/2ML IJ SOLN
INTRAMUSCULAR | Status: AC
  Filled 2020-07-18: qty 2

## 2020-07-18 MED ORDER — BUPROPION HCL ER (XL) 150 MG PO TB24: 150.0000 mg | ORAL_TABLET | Freq: Every morning | ORAL | Status: DC

## 2020-07-18 MED ORDER — OXYCODONE HCL 5 MG PO TABS
5.0000 mg | ORAL_TABLET | ORAL | Status: DC | PRN
  Administered 2020-07-18 – 2020-07-19 (×2): 10 mg via ORAL
  Filled 2020-07-18: qty 2

## 2020-07-18 MED ORDER — LACTATED RINGERS IV SOLN: INTRAVENOUS | Status: DC

## 2020-07-18 MED ORDER — BRIMONIDINE TARTRATE 0.025 % OP SOLN: 1.0000 [drp] | Freq: Every day | OPHTHALMIC | Status: DC

## 2020-07-18 MED ORDER — ONDANSETRON HCL 4 MG/2ML IJ SOLN
INTRAMUSCULAR | Status: DC | PRN
  Administered 2020-07-18: 4 mg via INTRAVENOUS

## 2020-07-18 MED ORDER — ACETAMINOPHEN 500 MG PO TABS
1000.0000 mg | ORAL_TABLET | Freq: Four times a day (QID) | ORAL | Status: DC
  Administered 2020-07-18 – 2020-07-19 (×4): 1000 mg via ORAL
  Filled 2020-07-18 (×5): qty 2

## 2020-07-18 MED ORDER — LABETALOL HCL 5 MG/ML IV SOLN
INTRAVENOUS | Status: AC
  Filled 2020-07-18: qty 4

## 2020-07-18 MED ORDER — BUPIVACAINE-EPINEPHRINE 0.25% -1:200000 IJ SOLN
INTRAMUSCULAR | Status: DC | PRN
  Administered 2020-07-18: 30 mL

## 2020-07-18 MED ORDER — 0.9 % SODIUM CHLORIDE (POUR BTL) OPTIME
TOPICAL | Status: DC | PRN
  Administered 2020-07-18: 400 mL

## 2020-07-18 MED ORDER — ROCURONIUM BROMIDE 10 MG/ML (PF) SYRINGE
PREFILLED_SYRINGE | INTRAVENOUS | Status: DC | PRN
  Administered 2020-07-18: 90 mg via INTRAVENOUS
  Administered 2020-07-18: 10 mg via INTRAVENOUS

## 2020-07-18 MED ORDER — FENTANYL CITRATE (PF) 250 MCG/5ML IJ SOLN
INTRAMUSCULAR | Status: AC
  Filled 2020-07-18: qty 5

## 2020-07-18 MED ORDER — DEXAMETHASONE SODIUM PHOSPHATE 10 MG/ML IJ SOLN
INTRAMUSCULAR | Status: AC
  Filled 2020-07-18: qty 1

## 2020-07-18 MED ORDER — DIPHENHYDRAMINE HCL 12.5 MG/5ML PO ELIX: 12.5000 mg | ORAL_SOLUTION | Freq: Four times a day (QID) | ORAL | Status: DC | PRN

## 2020-07-18 MED ORDER — FENTANYL CITRATE (PF) 100 MCG/2ML IJ SOLN
25.0000 ug | INTRAMUSCULAR | Status: DC | PRN
  Administered 2020-07-18 (×3): 50 ug via INTRAVENOUS

## 2020-07-18 MED ORDER — HYDROMORPHONE HCL 1 MG/ML IJ SOLN
1.0000 mg | INTRAMUSCULAR | Status: DC | PRN
  Administered 2020-07-18 – 2020-07-19 (×4): 1 mg via INTRAVENOUS
  Filled 2020-07-18 (×4): qty 1

## 2020-07-18 MED ORDER — ENSURE PRE-SURGERY PO LIQD
296.0000 mL | Freq: Once | ORAL | Status: DC
  Filled 2020-07-18: qty 296

## 2020-07-18 MED ORDER — DEXAMETHASONE SODIUM PHOSPHATE 10 MG/ML IJ SOLN
INTRAMUSCULAR | Status: DC | PRN
  Administered 2020-07-18: 10 mg via INTRAVENOUS

## 2020-07-18 MED ORDER — ENOXAPARIN SODIUM 40 MG/0.4ML IJ SOSY
40.0000 mg | PREFILLED_SYRINGE | INTRAMUSCULAR | Status: DC
  Filled 2020-07-18: qty 0.4

## 2020-07-18 MED ORDER — FENTANYL CITRATE (PF) 100 MCG/2ML IJ SOLN
INTRAMUSCULAR | Status: AC
  Filled 2020-07-18: qty 2

## 2020-07-18 MED ORDER — LORATADINE 10 MG PO TABS
10.0000 mg | ORAL_TABLET | Freq: Every day | ORAL | Status: DC
  Administered 2020-07-19: 10 mg via ORAL
  Filled 2020-07-18: qty 1

## 2020-07-18 MED ORDER — PROMETHAZINE HCL 25 MG/ML IJ SOLN: 6.2500 mg | INTRAMUSCULAR | Status: DC | PRN

## 2020-07-18 MED ORDER — ORAL CARE MOUTH RINSE: 15.0000 mL | Freq: Once | OROMUCOSAL | Status: AC

## 2020-07-18 MED ORDER — POTASSIUM CHLORIDE IN NACL 20-0.9 MEQ/L-% IV SOLN
INTRAVENOUS | Status: DC
  Filled 2020-07-18 (×2): qty 1000

## 2020-07-18 MED ORDER — OXYCODONE HCL 5 MG PO TABS
5.0000 mg | ORAL_TABLET | Freq: Once | ORAL | Status: DC | PRN
Start: 2020-07-18 — End: 2020-07-18

## 2020-07-18 MED ORDER — ONDANSETRON HCL 4 MG/2ML IJ SOLN: 4.0000 mg | Freq: Four times a day (QID) | INTRAMUSCULAR | Status: DC | PRN

## 2020-07-18 MED ORDER — SUGAMMADEX SODIUM 500 MG/5ML IV SOLN
INTRAVENOUS | Status: AC
  Filled 2020-07-18: qty 5

## 2020-07-18 MED ORDER — LABETALOL HCL 5 MG/ML IV SOLN
INTRAVENOUS | Status: DC | PRN
  Administered 2020-07-18: 5 mg via INTRAVENOUS

## 2020-07-18 MED ORDER — OXYCODONE HCL 5 MG/5ML PO SOLN: 5.0000 mg | Freq: Once | ORAL | Status: DC | PRN

## 2020-07-18 MED ORDER — PROPOFOL 10 MG/ML IV BOLUS
INTRAVENOUS | Status: DC | PRN
  Administered 2020-07-18: 180 mg via INTRAVENOUS
  Administered 2020-07-18: 20 mg via INTRAVENOUS

## 2020-07-18 MED ORDER — DIPHENHYDRAMINE HCL 50 MG/ML IJ SOLN: 12.5000 mg | Freq: Four times a day (QID) | INTRAMUSCULAR | Status: DC | PRN

## 2020-07-18 SURGICAL SUPPLY — 41 items
APPLIER CLIP 5 13 M/L LIGAMAX5 (MISCELLANEOUS)
BINDER ABDOMINAL 12 ML 46-62 (SOFTGOODS) ×2 IMPLANT
CABLE HIGH FREQUENCY MONO STRZ (ELECTRODE) ×2 IMPLANT
CHLORAPREP W/TINT 26 (MISCELLANEOUS) ×2 IMPLANT
CLIP APPLIE 5 13 M/L LIGAMAX5 (MISCELLANEOUS) IMPLANT
COVER WAND RF STERILE (DRAPES) IMPLANT
DECANTER SPIKE VIAL GLASS SM (MISCELLANEOUS) ×2 IMPLANT
DERMABOND ADVANCED (GAUZE/BANDAGES/DRESSINGS) ×1
DERMABOND ADVANCED .7 DNX12 (GAUZE/BANDAGES/DRESSINGS) ×1 IMPLANT
DEVICE SECURE STRAP 25 ABSORB (INSTRUMENTS) ×2 IMPLANT
DEVICE TROCAR PUNCTURE CLOSURE (ENDOMECHANICALS) ×2 IMPLANT
ELECT REM PT RETURN 15FT ADLT (MISCELLANEOUS) ×2 IMPLANT
GLOVE SURG ENC MOIS LTX SZ7.5 (GLOVE) ×2 IMPLANT
GOWN STRL REUS W/TWL XL LVL3 (GOWN DISPOSABLE) ×6 IMPLANT
KIT BASIN OR (CUSTOM PROCEDURE TRAY) ×2 IMPLANT
KIT TURNOVER KIT A (KITS) ×2 IMPLANT
MARKER SKIN DUAL TIP RULER LAB (MISCELLANEOUS) ×2 IMPLANT
MESH VENTRALIGHT ST 6X8 (Mesh Specialty) ×2 IMPLANT
MESH VENTRLGHT ELLIPSE 8X6XMFL (Mesh Specialty) ×1 IMPLANT
NEEDLE SPNL 22GX3.5 QUINCKE BK (NEEDLE) ×2 IMPLANT
PAD POSITIONING PINK XL (MISCELLANEOUS) IMPLANT
PENCIL SMOKE EVACUATOR (MISCELLANEOUS) IMPLANT
PROTECTOR NERVE ULNAR (MISCELLANEOUS) IMPLANT
SCISSORS LAP 5X35 DISP (ENDOMECHANICALS) ×2 IMPLANT
SET IRRIG TUBING LAPAROSCOPIC (IRRIGATION / IRRIGATOR) IMPLANT
SET TUBE SMOKE EVAC HIGH FLOW (TUBING) ×2 IMPLANT
SHEARS HARMONIC ACE PLUS 36CM (ENDOMECHANICALS) ×2 IMPLANT
SLEEVE XCEL OPT CAN 5 100 (ENDOMECHANICALS) ×2 IMPLANT
SOL ANTI FOG 6CC (MISCELLANEOUS) ×1 IMPLANT
SOLUTION ANTI FOG 6CC (MISCELLANEOUS) ×1
STRIP CLOSURE SKIN 1/2X4 (GAUZE/BANDAGES/DRESSINGS) IMPLANT
SUT MNCRL AB 4-0 PS2 18 (SUTURE) IMPLANT
SUT NOVA NAB DX-16 0-1 5-0 T12 (SUTURE) ×2 IMPLANT
TACKER 5MM HERNIA 3.5CML NAB (ENDOMECHANICALS) IMPLANT
TAPE CLOTH 4X10 WHT NS (GAUZE/BANDAGES/DRESSINGS) IMPLANT
TOWEL OR 17X26 10 PK STRL BLUE (TOWEL DISPOSABLE) ×2 IMPLANT
TOWEL OR NON WOVEN STRL DISP B (DISPOSABLE) ×2 IMPLANT
TRAY FOLEY MTR SLVR 16FR STAT (SET/KITS/TRAYS/PACK) IMPLANT
TRAY LAPAROSCOPIC (CUSTOM PROCEDURE TRAY) ×2 IMPLANT
TROCAR BLADELESS OPT 5 100 (ENDOMECHANICALS) ×2 IMPLANT
TROCAR XCEL NON-BLD 11X100MML (ENDOMECHANICALS) ×2 IMPLANT

## 2020-07-18 NOTE — Transfer of Care (Signed)
Immediate Anesthesia Transfer of Care Note  Patient: Russell Thomas  Procedure(s) Performed: [place] WITH MESH  Patient Location: PACU  Anesthesia Type:General  Level of Consciousness: awake, alert  and oriented  Airway & Oxygen Therapy: Patient Spontanous Breathing and Patient connected to face mask oxygen  Post-op Assessment: Report given to RN and Post -op Vital signs reviewed and stable  Post vital signs: Reviewed and stable  Last Vitals:  Vitals Value Taken Time  BP    Temp    Pulse    Resp    SpO2      Last Pain:  Vitals:   07/18/20 0602  TempSrc: Oral  PainSc:       Patients Stated Pain Goal: 2 (42/35/36 1443)  Complications: No notable events documented.

## 2020-07-18 NOTE — Plan of Care (Signed)

## 2020-07-18 NOTE — Op Note (Signed)
07/18/2020  PATIENT:  Russell Thomas  60 y.o. male  Patient Care Team: Lavone Orn, MD as PCP - General (Internal Medicine) Marshell Garfinkel, MD as Consulting Physician (Pulmonary Disease) Inda Castle, MD (Inactive) as Consulting Physician (Gastroenterology)  PRE-OPERATIVE DIAGNOSIS:  INCISIONAL HERNIA  POST-OPERATIVE DIAGNOSIS:  INCISIONAL HERNIA  PROCEDURE:   LAPAROSCOPIC REPAIR OF  VENTRAL ABDOMINAL WALL HERNIA WITH MESH   SURGEON:  Coralie Keens, MD  ASSISTANT: Mosie Lukes, MD  ANESTHESIA:   General  EBL:  Minimal  DRAINS: none   SPECIMEN:  No Specimen  PATIENT DISPOSITION:  PACU - hemodynamically stable.   INDICATION: 60 y/o M hx prostatectomy c/b ventral abdominal wall incisional hernia containing loops of small bowel without evidence of obstruction. The patient presents today for operative repair.  OR FINDINGS: fat containing ventral abdominal wall incision hernia  Type of repair: Laparoscopic underlay repair   Placement of mesh: Intraperitoneal underlay repair  Name of mesh: Bard Ventralight dual sided (polypropylene / Seprafilm)  Size of mesh:  8 x 10 inch  Orientation: Vertical  Mesh overlap:  5cm   DESCRIPTION:   Informed consent was confirmed. The patient underwent general anesthesia without difficulty. The patient was positioned appropriately. VTE prevention in place. The patient's abdomen was clipped, prepped, & draped in a sterile fashion. Surgical timeout confirmed our plan.  The patient was positioned in reverse Trendelenburg. Abdominal entry was gained using optical entry technique in the left upper abdomen. Entry was clean. Pneumoperitoneum was established to 22mmHg, which the patient tolerated well. Camera inspection revealed no injury. Two additional left sided ports (55mm and 55mm) were carefully placed under direct laparoscopic visualization.   The small bowel contained in the hernia sac reduced spontaneously during induction of  anesthesia. We were able to visualize a fat containing ventral hernia, with a circular fascial defect approximately 3cm in diameter. The hernia contents were reduced using a combination of blunt dissection and cautery with the Harmonic device. Hemostasis was confirmed. To ensure that we would have at least 5 cm radial coverage outside of the hernia defect, we chose a  8x10 inch  dual sided mesh. I placed four #1 prolene stitches at the edge of the mesh at the 3, 6, 9, and 12:00 positions. We rolled the mesh & placed into the peritoneal cavity through the left lateral 51mm port site. The mesh was unrolled and positioned appropriately.  We secured the mesh to cover up the hernia defect using a laparoscopic suture passer to pass the tails of the Prolene through the abdominal wall & tagged them with clamps for good transfascial suturing. Pneumoperitoneum was then reduced and the fascial stitches were tied down. We tacked the edges & central part of the mesh to the peritoneum/posterior rectus fascia with SecureStrap absorbable tacks. On reinspection, the mesh laid well. Hemostasis was confirmed.  Pneumoperitoneum was evacuated. Ports were removed. The fascia at the 35mm port site was closed with 2-0 vicryl. The skin was closed with Monocryl at the port sites and dermabond. Dermabond was also applied to 4 transfascial stab incision sites.   All counts were correct x 2 at the end of the procedure. The patient tolerated the procedure well and was transported to the PACU in stable condition.  Gala Lewandowsky, MD [facility] Resident, PGY-6

## 2020-07-18 NOTE — Anesthesia Procedure Notes (Signed)
Procedure Name: Intubation Date/Time: 07/18/2020 7:28 AM Performed by: Sharlette Dense, CRNA Patient Re-evaluated:Patient Re-evaluated prior to induction Oxygen Delivery Method: Circle system utilized Preoxygenation: Pre-oxygenation with 100% oxygen Induction Type: IV induction Ventilation: Mask ventilation without difficulty and Oral airway inserted - appropriate to patient size Laryngoscope Size: Miller and 3 Grade View: Grade II Tube type: Oral Tube size: 8.0 mm Number of attempts: 1 Airway Equipment and Method: Stylet Placement Confirmation: ETT inserted through vocal cords under direct vision, positive ETCO2 and breath sounds checked- equal and bilateral Secured at: 22 cm Tube secured with: Tape Dental Injury: Teeth and Oropharynx as per pre-operative assessment

## 2020-07-18 NOTE — Interval H&P Note (Signed)
History and Physical Interval Note:no change in H and P  07/18/2020 6:53 AM  Russell Thomas  has presented today for surgery, with the diagnosis of INCISIONAL HERNIA.  The various methods of treatment have been discussed with the patient and family. After consideration of risks, benefits and other options for treatment, the patient has consented to  Procedure(s): [place] (N/A) as a surgical intervention.  The patient's history has been reviewed, patient examined, no change in status, stable for surgery.  I have reviewed the patient's chart and labs.  Questions were answered to the patient's satisfaction.     Coralie Keens

## 2020-07-19 DIAGNOSIS — Z8546 Personal history of malignant neoplasm of prostate: Secondary | ICD-10-CM | POA: Diagnosis not present

## 2020-07-19 DIAGNOSIS — E119 Type 2 diabetes mellitus without complications: Secondary | ICD-10-CM | POA: Diagnosis not present

## 2020-07-19 DIAGNOSIS — K432 Incisional hernia without obstruction or gangrene: Secondary | ICD-10-CM | POA: Diagnosis not present

## 2020-07-19 MED ORDER — OXYCODONE HCL 5 MG PO TABS: 5.0000 mg | ORAL_TABLET | Freq: Four times a day (QID) | ORAL | 0 refills | Status: DC | PRN

## 2020-07-19 NOTE — Discharge Summary (Signed)
Physician Discharge Summary  Patient ID: ISIAH SCHEEL MRN: [id number] DOB/AGE: 1960/12/02 60 y.o.  Admit date: 07/18/2020 Discharge date: 07/19/2020  Admission Diagnoses:  Discharge Diagnoses:  Active Problems:   S/P laparoscopic hernia repair INCISIONAL HERNIA  Discharged Condition: good  Hospital Course: uneventful post op recovery.  Discharged home POD#1  Consults: None  Significant Diagnostic Studies:   Treatments: surgery: laparoscopic incisional hernia repair with mesh  Discharge Exam: Blood pressure 114/70, pulse 63, temperature (!) 97.5 F (36.4 C), temperature source Oral, resp. rate 18, height 6\' 1"  (1.854 m), weight 133.5 kg, SpO2 97 %. General appearance: alert, cooperative, and no distress Resp: clear to auscultation bilaterally Cardio: regular rate and rhythm, S1, S2 normal, no murmur, click, rub or gallop Incision/Wound: abdomen soft, minimally tender, incisions clean  Disposition: Discharge disposition: 01-Home or Self Care        Allergies as of 07/19/2020       Reactions   Cephalosporins Hives, Rash   Ciprofloxacin Rash   Iodinated Diagnostic Agents Hives, Itching, Rash   GADOLINIUM   Omeprazole Rash   Penicillins Itching, Rash, Other (See Comments)   Weezing, blisters Has patient had a PCN reaction causing immediate rash, facial/tongue/throat swelling, SOB or lightheadedness with hypotension: Yes Has patient had a PCN reaction causing severe rash involving mucus membranes or skin necrosis: No Has patient had a PCN reaction that required hospitalization: No Has patient had a PCN reaction occurring within the last 10 years: No If all of the above answers are "NO", then may proceed with Cephalosporin use.   Shrimp [shellfish Allergy] Rash   Sulfa Antibiotics Hives, Rash   Sulfonamide Derivatives Rash   Omnipaque [iohexol] Hives, Itching   Scratchy palms, scratchy throat, face and chest redness, hives all over. (CT fluoroscopic Contrast)           Medication List     TAKE these medications    bisacodyl 5 MG EC tablet Commonly known as: DULCOLAX Take 10 mg by mouth daily as needed for moderate constipation.   buPROPion 150 MG 24 hr tablet Commonly known as: WELLBUTRIN XL Take 150 mg by mouth every morning.   CENTRUM SILVER PO Take 1 tablet by mouth daily.   loratadine 10 MG tablet Commonly known as: CLARITIN Take 10 mg by mouth daily.   Lumify 0.025 % Soln Generic drug: Brimonidine Tartrate Place 1 drop into both eyes daily.   oxyCODONE 5 MG immediate release tablet Commonly known as: Oxy IR/ROXICODONE Take 1 tablet (5 mg total) by mouth every 6 (six) hours as needed for moderate pain or severe pain.   triamcinolone 55 MCG/ACT Aero nasal inhaler Commonly known as: NASACORT Place 2 sprays into the nose daily as needed (allergies).        Follow-up Information     Coralie Keens, MD Follow up in 4 week(s).   Specialty: General Surgery Contact information: [place] [address] [phone]                 Signed: Coralie Keens 07/19/2020, 8:40 AM

## 2020-07-19 NOTE — Anesthesia Postprocedure Evaluation (Signed)
Anesthesia Post Note  Patient: Russell Thomas  Procedure(s) Performed: [place] WITH MESH     Patient location during evaluation: PACU Anesthesia Type: General Level of consciousness: awake and alert and oriented Pain management: pain level controlled Vital Signs Assessment: post-procedure vital signs reviewed and stable Respiratory status: spontaneous breathing, nonlabored ventilation and respiratory function stable Cardiovascular status: blood pressure returned to baseline and stable Postop Assessment: no apparent nausea or vomiting Anesthetic complications: no   No notable events documented.                 Aniqa Hare A.

## 2020-07-19 NOTE — Progress Notes (Signed)
Patient has ambulated in the [place] during  this shift. Tolerated well. Patient is voiding without difficulty and pain is at minimum with current pain regimen.  Abdominal surgical is clean and dry and abdominal binder is intact.   RT in earlier to set place CPAP.  Patient up in recliner earlier for 1.5 hours, tolerated well.

## 2020-07-19 NOTE — Discharge Instructions (Signed)
[facility] _______[facility]  UMBILICAL OR INGUINAL HERNIA REPAIR: POST OP INSTRUCTIONS  Always review your discharge instruction sheet given to you by the facility where your surgery was performed. IF YOU HAVE DISABILITY OR FAMILY LEAVE FORMS, YOU MUST BRING THEM TO THE OFFICE FOR PROCESSING.   DO NOT GIVE THEM TO YOUR DOCTOR.  1. A  prescription for pain medication may be given to you upon discharge.  Take your pain medication as prescribed, if needed.  If narcotic pain medicine is not needed, then you may take acetaminophen (Tylenol) or ibuprofen (Advil) as needed. 2. Take your usually prescribed medications unless otherwise directed. If you need a refill on your pain medication, please contact your pharmacy.  They will contact our office to request authorization. Prescriptions will not be filled after 5 pm or on week-ends. 3. You should follow a light diet the first 24 hours after arrival home, such as soup and crackers, etc.  Be sure to include lots of fluids daily.  Resume your normal diet the day after surgery. 4.Most patients will experience some swelling and bruising around the umbilicus or in the groin and scrotum.  Ice packs and reclining will help.  Swelling and bruising can take several days to resolve.  6. It is common to experience some constipation if taking pain medication after surgery.  Increasing fluid intake and taking a stool softener (such as Colace) will usually help or prevent this problem from occurring.  A mild laxative (Milk of Magnesia or Miralax) should be taken according to package directions if there are no bowel movements after 48 hours. 7. Unless discharge instructions indicate otherwise, you may remove your bandages 24-48 hours after surgery, and you may shower at that time.  You may have steri-strips (small skin tapes) in place directly over the incision.  These strips should be left on the skin for 7-10 days.  If your surgeon used skin glue on the  incision, you may shower in 24 hours.  The glue will flake off over the next 2-3 weeks.  Any sutures or staples will be removed at the office during your follow-up visit. 8. ACTIVITIES:  You may resume regular (light) daily activities beginning the next day--such as daily self-care, walking, climbing stairs--gradually increasing activities as tolerated.  You may have sexual intercourse when it is comfortable.  Refrain from any heavy lifting or straining until approved by your doctor.  a.You may drive when you are no longer taking prescription pain medication, you can comfortably wear a seatbelt, and you can safely maneuver your car and apply brakes. b.RETURN TO WORK:   _____________________________________________  9.You should see your doctor in the office for a follow-up appointment approximately 2-3 weeks after your surgery.  Make sure that you call for this appointment within a day or two after you arrive home to insure a convenient appointment time. 10.OTHER INSTRUCTIONS: __OK TO SHOWER STARTING TODAY WEAR THE BINDER FOR COMFORT TYLENOL, IBUPROFEN, AND ICE PACK ALSO FOR PAIN NO LIFTING MORE THAN 15 TO 20 POUNDS FOR 6 WEEKS_______________________    _____________________________________  WHEN TO CALL YOUR DOCTOR: Fever over 101.0 Inability to urinate Nausea and/or vomiting Extreme swelling or bruising Continued bleeding from incision. Increased pain, redness, or drainage from the incision  The clinic staff is available to answer your questions during regular business hours.  Please don't hesitate to call and ask to speak to one of the nurses for clinical concerns.  If you have a medical emergency, go to the  nearest emergency room or call 911.  A surgeon from [facility] is always on call at the hospital   [address], [place], [place], [place]  [address] ?  [address], [place], [place]   [address] [phone] ? [phone] ? FAX [phone] Web site:  [email or web address]

## 2020-07-21 ENCOUNTER — Encounter (HOSPITAL_COMMUNITY): Payer: Self-pay | Admitting: Surgery

## 2020-07-27 DIAGNOSIS — Z8719 Personal history of other diseases of the digestive system: Secondary | ICD-10-CM | POA: Diagnosis not present

## 2020-08-12 ENCOUNTER — Other Ambulatory Visit (HOSPITAL_COMMUNITY): Payer: Self-pay | Admitting: Surgery

## 2020-08-12 DIAGNOSIS — R1031 Right lower quadrant pain: Secondary | ICD-10-CM

## 2020-08-15 ENCOUNTER — Ambulatory Visit (HOSPITAL_COMMUNITY)
Admission: RE | Admit: 2020-08-15 | Discharge: 2020-08-15 | Disposition: A | Discharge status: Home / Self Care | Payer: BC Managed Care – PPO | Source: Ambulatory Visit | Attending: Surgery | Admitting: Surgery

## 2020-08-15 ENCOUNTER — Other Ambulatory Visit: Payer: Self-pay

## 2020-08-15 DIAGNOSIS — Z9889 Other specified postprocedural states: Secondary | ICD-10-CM | POA: Diagnosis not present

## 2020-08-15 DIAGNOSIS — R1031 Right lower quadrant pain: Secondary | ICD-10-CM | POA: Diagnosis not present

## 2020-08-15 DIAGNOSIS — K4090 Unilateral inguinal hernia, without obstruction or gangrene, not specified as recurrent: Secondary | ICD-10-CM | POA: Diagnosis not present

## 2020-08-15 DIAGNOSIS — K429 Umbilical hernia without obstruction or gangrene: Secondary | ICD-10-CM | POA: Diagnosis not present

## 2020-08-15 DIAGNOSIS — Z8546 Personal history of malignant neoplasm of prostate: Secondary | ICD-10-CM | POA: Diagnosis not present

## 2020-08-26 DIAGNOSIS — G4733 Obstructive sleep apnea (adult) (pediatric): Secondary | ICD-10-CM | POA: Diagnosis not present

## 2020-09-12 DIAGNOSIS — Z8719 Personal history of other diseases of the digestive system: Secondary | ICD-10-CM | POA: Diagnosis not present

## 2020-09-12 DIAGNOSIS — Z9889 Other specified postprocedural states: Secondary | ICD-10-CM | POA: Diagnosis not present

## 2020-09-20 ENCOUNTER — Encounter: Payer: Self-pay | Admitting: Gastroenterology

## 2020-09-20 DIAGNOSIS — R7301 Impaired fasting glucose: Secondary | ICD-10-CM | POA: Diagnosis not present

## 2020-09-20 DIAGNOSIS — E229 Hyperfunction of pituitary gland, unspecified: Secondary | ICD-10-CM | POA: Diagnosis not present

## 2020-09-20 DIAGNOSIS — Z13220 Encounter for screening for lipoid disorders: Secondary | ICD-10-CM | POA: Diagnosis not present

## 2020-09-20 DIAGNOSIS — Z23 Encounter for immunization: Secondary | ICD-10-CM | POA: Diagnosis not present

## 2020-09-20 DIAGNOSIS — Z0000 Encounter for general adult medical examination without abnormal findings: Secondary | ICD-10-CM | POA: Diagnosis not present

## 2020-09-20 DIAGNOSIS — G4733 Obstructive sleep apnea (adult) (pediatric): Secondary | ICD-10-CM | POA: Diagnosis not present

## 2020-09-20 DIAGNOSIS — Z8546 Personal history of malignant neoplasm of prostate: Secondary | ICD-10-CM | POA: Diagnosis not present

## 2020-09-20 DIAGNOSIS — R800 Isolated proteinuria: Secondary | ICD-10-CM | POA: Diagnosis not present

## 2020-09-26 DIAGNOSIS — U071 COVID-19: Secondary | ICD-10-CM | POA: Diagnosis not present

## 2020-10-05 DIAGNOSIS — U071 COVID-19: Secondary | ICD-10-CM | POA: Diagnosis not present

## 2020-11-21 DIAGNOSIS — Z6839 Body mass index (BMI) 39.0-39.9, adult: Secondary | ICD-10-CM | POA: Diagnosis not present

## 2020-11-21 DIAGNOSIS — R079 Chest pain, unspecified: Secondary | ICD-10-CM | POA: Diagnosis not present

## 2020-11-21 DIAGNOSIS — R42 Dizziness and giddiness: Secondary | ICD-10-CM | POA: Diagnosis not present

## 2020-11-21 DIAGNOSIS — I10 Essential (primary) hypertension: Secondary | ICD-10-CM | POA: Diagnosis not present

## 2020-11-29 DIAGNOSIS — C61 Malignant neoplasm of prostate: Secondary | ICD-10-CM | POA: Diagnosis not present

## 2020-11-29 DIAGNOSIS — R42 Dizziness and giddiness: Secondary | ICD-10-CM | POA: Diagnosis not present

## 2020-11-29 DIAGNOSIS — R7303 Prediabetes: Secondary | ICD-10-CM | POA: Diagnosis not present

## 2020-11-29 DIAGNOSIS — R309 Painful micturition, unspecified: Secondary | ICD-10-CM | POA: Diagnosis not present

## 2020-11-29 DIAGNOSIS — G4733 Obstructive sleep apnea (adult) (pediatric): Secondary | ICD-10-CM | POA: Diagnosis not present

## 2020-11-29 DIAGNOSIS — I10 Essential (primary) hypertension: Secondary | ICD-10-CM | POA: Diagnosis not present

## 2020-12-05 DIAGNOSIS — N5201 Erectile dysfunction due to arterial insufficiency: Secondary | ICD-10-CM | POA: Diagnosis not present

## 2020-12-05 DIAGNOSIS — N481 Balanitis: Secondary | ICD-10-CM | POA: Diagnosis not present

## 2020-12-05 DIAGNOSIS — C61 Malignant neoplasm of prostate: Secondary | ICD-10-CM | POA: Diagnosis not present

## 2020-12-05 DIAGNOSIS — C775 Secondary and unspecified malignant neoplasm of intrapelvic lymph nodes: Secondary | ICD-10-CM | POA: Diagnosis not present

## 2020-12-16 ENCOUNTER — Ambulatory Visit: Payer: BC Managed Care – PPO | Admitting: Internal Medicine

## 2021-02-21 DIAGNOSIS — R0609 Other forms of dyspnea: Secondary | ICD-10-CM | POA: Diagnosis not present

## 2021-02-21 DIAGNOSIS — R0789 Other chest pain: Secondary | ICD-10-CM | POA: Diagnosis not present

## 2021-02-21 DIAGNOSIS — R1013 Epigastric pain: Secondary | ICD-10-CM | POA: Diagnosis not present

## 2021-02-24 ENCOUNTER — Ambulatory Visit
Admission: RE | Admit: 2021-02-24 | Discharge: 2021-02-24 | Disposition: A | Discharge status: Home / Self Care | Payer: BC Managed Care – PPO | Source: Ambulatory Visit | Attending: Geriatric Medicine | Admitting: Geriatric Medicine

## 2021-02-24 ENCOUNTER — Other Ambulatory Visit: Payer: Self-pay | Admitting: Geriatric Medicine

## 2021-02-24 DIAGNOSIS — M549 Dorsalgia, unspecified: Secondary | ICD-10-CM | POA: Diagnosis not present

## 2021-02-24 DIAGNOSIS — R0789 Other chest pain: Secondary | ICD-10-CM

## 2021-02-24 DIAGNOSIS — R1312 Dysphagia, oropharyngeal phase: Secondary | ICD-10-CM | POA: Diagnosis not present

## 2021-02-24 DIAGNOSIS — M546 Pain in thoracic spine: Secondary | ICD-10-CM | POA: Diagnosis not present

## 2021-02-24 DIAGNOSIS — Z79899 Other long term (current) drug therapy: Secondary | ICD-10-CM | POA: Diagnosis not present

## 2021-02-24 DIAGNOSIS — I77810 Thoracic aortic ectasia: Secondary | ICD-10-CM | POA: Diagnosis not present

## 2021-03-01 DIAGNOSIS — G4733 Obstructive sleep apnea (adult) (pediatric): Secondary | ICD-10-CM | POA: Diagnosis not present

## 2021-03-10 DIAGNOSIS — Z79899 Other long term (current) drug therapy: Secondary | ICD-10-CM | POA: Diagnosis not present

## 2021-04-20 DIAGNOSIS — H903 Sensorineural hearing loss, bilateral: Secondary | ICD-10-CM | POA: Diagnosis not present

## 2021-04-20 DIAGNOSIS — D440 Neoplasm of uncertain behavior of thyroid gland: Secondary | ICD-10-CM | POA: Diagnosis not present

## 2021-04-20 DIAGNOSIS — H6123 Impacted cerumen, bilateral: Secondary | ICD-10-CM | POA: Diagnosis not present

## 2021-04-20 DIAGNOSIS — R490 Dysphonia: Secondary | ICD-10-CM | POA: Diagnosis not present

## 2021-04-28 ENCOUNTER — Other Ambulatory Visit: Payer: Self-pay | Admitting: Otolaryngology

## 2021-04-28 DIAGNOSIS — R221 Localized swelling, mass and lump, neck: Secondary | ICD-10-CM

## 2021-05-25 ENCOUNTER — Other Ambulatory Visit: Payer: Self-pay | Admitting: Otolaryngology

## 2021-05-25 ENCOUNTER — Ambulatory Visit
Admission: RE | Admit: 2021-05-25 | Discharge: 2021-05-25 | Disposition: A | Discharge status: Home / Self Care | Payer: BC Managed Care – PPO | Source: Ambulatory Visit | Attending: Otolaryngology | Admitting: Otolaryngology

## 2021-05-25 DIAGNOSIS — R221 Localized swelling, mass and lump, neck: Secondary | ICD-10-CM

## 2021-05-25 DIAGNOSIS — M47812 Spondylosis without myelopathy or radiculopathy, cervical region: Secondary | ICD-10-CM | POA: Diagnosis not present

## 2021-05-30 DIAGNOSIS — C61 Malignant neoplasm of prostate: Secondary | ICD-10-CM | POA: Diagnosis not present

## 2021-05-31 DIAGNOSIS — G4733 Obstructive sleep apnea (adult) (pediatric): Secondary | ICD-10-CM | POA: Diagnosis not present

## 2021-06-01 DIAGNOSIS — D440 Neoplasm of uncertain behavior of thyroid gland: Secondary | ICD-10-CM | POA: Diagnosis not present

## 2021-06-01 DIAGNOSIS — J310 Chronic rhinitis: Secondary | ICD-10-CM | POA: Diagnosis not present

## 2021-06-02 DIAGNOSIS — C61 Malignant neoplasm of prostate: Secondary | ICD-10-CM | POA: Diagnosis not present

## 2021-06-02 DIAGNOSIS — N5201 Erectile dysfunction due to arterial insufficiency: Secondary | ICD-10-CM | POA: Diagnosis not present

## 2021-06-02 DIAGNOSIS — C775 Secondary and unspecified malignant neoplasm of intrapelvic lymph nodes: Secondary | ICD-10-CM | POA: Diagnosis not present

## 2021-06-05 DIAGNOSIS — E6609 Other obesity due to excess calories: Secondary | ICD-10-CM | POA: Diagnosis not present

## 2021-06-05 DIAGNOSIS — K8020 Calculus of gallbladder without cholecystitis without obstruction: Secondary | ICD-10-CM | POA: Diagnosis not present

## 2021-06-05 DIAGNOSIS — K219 Gastro-esophageal reflux disease without esophagitis: Secondary | ICD-10-CM | POA: Diagnosis not present

## 2021-06-05 DIAGNOSIS — R7401 Elevation of levels of liver transaminase levels: Secondary | ICD-10-CM | POA: Diagnosis not present

## 2021-08-18 DIAGNOSIS — H1033 Unspecified acute conjunctivitis, bilateral: Secondary | ICD-10-CM | POA: Diagnosis not present

## 2021-08-30 DIAGNOSIS — K219 Gastro-esophageal reflux disease without esophagitis: Secondary | ICD-10-CM | POA: Diagnosis not present

## 2021-08-30 DIAGNOSIS — R140 Abdominal distension (gaseous): Secondary | ICD-10-CM | POA: Diagnosis not present

## 2021-08-30 DIAGNOSIS — R195 Other fecal abnormalities: Secondary | ICD-10-CM | POA: Diagnosis not present

## 2021-08-30 DIAGNOSIS — R110 Nausea: Secondary | ICD-10-CM | POA: Diagnosis not present

## 2021-08-31 DIAGNOSIS — G4733 Obstructive sleep apnea (adult) (pediatric): Secondary | ICD-10-CM | POA: Diagnosis not present

## 2021-11-08 DIAGNOSIS — I10 Essential (primary) hypertension: Secondary | ICD-10-CM | POA: Diagnosis not present

## 2021-11-08 DIAGNOSIS — Z23 Encounter for immunization: Secondary | ICD-10-CM | POA: Diagnosis not present

## 2021-11-08 DIAGNOSIS — E7800 Pure hypercholesterolemia, unspecified: Secondary | ICD-10-CM | POA: Diagnosis not present

## 2021-11-08 DIAGNOSIS — R5383 Other fatigue: Secondary | ICD-10-CM | POA: Diagnosis not present

## 2021-11-08 DIAGNOSIS — Z0000 Encounter for general adult medical examination without abnormal findings: Secondary | ICD-10-CM | POA: Diagnosis not present

## 2021-11-08 DIAGNOSIS — R7303 Prediabetes: Secondary | ICD-10-CM | POA: Diagnosis not present

## 2021-11-14 DIAGNOSIS — Z0000 Encounter for general adult medical examination without abnormal findings: Secondary | ICD-10-CM | POA: Diagnosis not present

## 2021-11-14 DIAGNOSIS — R5383 Other fatigue: Secondary | ICD-10-CM | POA: Diagnosis not present

## 2021-11-14 DIAGNOSIS — E291 Testicular hypofunction: Secondary | ICD-10-CM | POA: Diagnosis not present

## 2021-12-05 DIAGNOSIS — G4733 Obstructive sleep apnea (adult) (pediatric): Secondary | ICD-10-CM | POA: Diagnosis not present

## 2021-12-19 ENCOUNTER — Telehealth: Payer: Self-pay

## 2021-12-19 NOTE — Patient Outreach (Signed)
  Care Coordination   12/19/2021 Name: Russell Thomas MRN: [id number] DOB: [birth year]   Care Coordination Outreach Attempts:  An unsuccessful telephone outreach was attempted today to offer the patient information about available care coordination services as a benefit of their health plan.   Follow Up Plan:  Additional outreach attempts will be made to offer the patient care coordination information and services.   Encounter Outcome:  No Answer  Care Coordination Interventions Activated:  No   Care Coordination Interventions:  No, not indicated    Peter Garter RN, BSN,CCM, [facility] [phone]

## 2022-01-08 ENCOUNTER — Telehealth: Payer: Self-pay

## 2022-01-08 DIAGNOSIS — K219 Gastro-esophageal reflux disease without esophagitis: Secondary | ICD-10-CM | POA: Diagnosis not present

## 2022-01-08 DIAGNOSIS — K921 Melena: Secondary | ICD-10-CM | POA: Diagnosis not present

## 2022-01-08 NOTE — Patient Outreach (Signed)
  Care Coordination   Initial Visit Note   01/08/2022 Name: Russell Thomas MRN: [id number] DOB: 03/16/1960  Russell Thomas is a 61 y.o. year old male who sees Lavone Orn, MD for primary care. I spoke with  Russell Thomas by phone today.  What matters to the patients health and wellness today?  No concerns today    Goals Addressed             This Visit's Progress    COMPLETED: Care Coordination Activities - no follow up required       Care Coordination Interventions: Provided education to patient re: care coordination services Assessed social determinant of health barriers          SDOH assessments and interventions completed:  Yes  SDOH Interventions Today    Flowsheet Row Most Recent Value  SDOH Interventions   Food Insecurity Interventions Intervention Not Indicated  Housing Interventions Intervention Not Indicated  Transportation Interventions Intervention Not Indicated  Utilities Interventions Intervention Not Indicated  Financial Strain Interventions Intervention Not Indicated        Care Coordination Interventions:  Yes, provided   Follow up plan: No further intervention required.   Encounter Outcome:  Pt. Visit Completed  Russell Garter RN, BSN,CCM, CDE Care Management Coordinator [facility] [phone]

## 2022-01-08 NOTE — Patient Instructions (Signed)
Visit Information  Thank you for taking time to visit with me today. Please don't hesitate to contact me if I can be of assistance to you.   Following are the goals we discussed today:   Goals Addressed             This Visit's Progress    COMPLETED: Care Coordination Activities - no follow up required       Care Coordination Interventions: Provided education to patient re: care coordination services Assessed social determinant of health barriers          If you are experiencing a Mental Health or [place] or need someone to talk to, please call the Suicide and Crisis Lifeline: 988 call the [place] National Suicide Prevention Lifeline: (902)469-3160 or TTY: (279)809-9217 TTY 606-302-9458) to talk to a trained counselor call 1-800-273-TALK (toll free, 24 hour hotline) go to [facility] [place] [phone]) call 911   Patient verbalizes understanding of instructions and care plan provided today and agrees to view in [place]. Active MyChart status and patient understanding of how to access instructions and care plan via MyChart confirmed with patient.     No further follow up required:    Peter Garter RN, Jackquline [place], [place] [facility] [phone]

## 2022-01-10 DIAGNOSIS — K219 Gastro-esophageal reflux disease without esophagitis: Secondary | ICD-10-CM | POA: Diagnosis not present

## 2022-01-10 DIAGNOSIS — K2930 Chronic superficial gastritis without bleeding: Secondary | ICD-10-CM | POA: Diagnosis not present

## 2022-01-10 DIAGNOSIS — K317 Polyp of stomach and duodenum: Secondary | ICD-10-CM | POA: Diagnosis not present

## 2022-01-10 DIAGNOSIS — K921 Melena: Secondary | ICD-10-CM | POA: Diagnosis not present

## 2022-01-16 DIAGNOSIS — L720 Epidermal cyst: Secondary | ICD-10-CM | POA: Diagnosis not present

## 2022-01-16 DIAGNOSIS — L249 Irritant contact dermatitis, unspecified cause: Secondary | ICD-10-CM | POA: Diagnosis not present

## 2022-01-22 ENCOUNTER — Inpatient Hospital Stay (HOSPITAL_BASED_OUTPATIENT_CLINIC_OR_DEPARTMENT_OTHER): Payer: BC Managed Care – PPO | Admitting: Genetic Counselor

## 2022-01-22 ENCOUNTER — Inpatient Hospital Stay: Payer: BC Managed Care – PPO | Attending: Genetic Counselor

## 2022-01-22 DIAGNOSIS — C61 Malignant neoplasm of prostate: Secondary | ICD-10-CM | POA: Diagnosis not present

## 2022-01-22 LAB — GENETIC SCREENING ORDER

## 2022-01-23 DIAGNOSIS — C61 Malignant neoplasm of prostate: Secondary | ICD-10-CM | POA: Diagnosis not present

## 2022-01-24 DIAGNOSIS — M79645 Pain in left finger(s): Secondary | ICD-10-CM | POA: Diagnosis not present

## 2022-01-24 DIAGNOSIS — M546 Pain in thoracic spine: Secondary | ICD-10-CM | POA: Diagnosis not present

## 2022-01-24 NOTE — Progress Notes (Signed)
REFERRING PROVIDER: Self-referred  PRIMARY PROVIDER:  Lavone Orn, MD  PRIMARY REASON FOR VISIT:  1. Prostate cancer ([place])      HISTORY OF PRESENT ILLNESS:   Russell Thomas, a 61 y.o. male, was seen for a [place] cancer genetics consultation due to a personal history of prostate cancer.  Russell Thomas presents to clinic today to discuss the possibility of a hereditary predisposition to cancer, to discuss genetic testing, and to further clarify his future cancer risks, as well as potential cancer risks for family members.   In 2019, at the age of 40, Russell Thomas was diagnosed with adenocarcinoma of the of the prostate (Gleason 3+4=7) s/p prostatectomy.  Metastatic carcinoma present in two right pelvic lymph nodes.   CANCER HISTORY:  Oncology History   No history exists.      Past Medical History:  Diagnosis Date   Adenomatous colon polyp 2008   Dr Deatra Ina, GI   Allergic rhinitis    Anxiety    Diverticulosis    Gallbladder attack 2015   stones present   Gastritis    Hyperlipemia 2009   IBS (irritable bowel syndrome)    Internal hemorrhoids 9798   banded    Lichen planus    Pre-diabetes    Prostate cancer ([place])    Seasonal allergies    Sleep apnea    [facility]    Past Surgical History:  Procedure Laterality Date   BIOPSY PROSTATE  12/03/2016   COLONOSCOPY  04/10/2010, 08/12/2006   3/12 with banding; 2008: adenomatous polyp, diverticulosis   INCISIONAL HERNIA REPAIR N/A 07/18/2020   Procedure: LAPAROSCOPIC INCISIONAL HERNIA REPAIR WITH MESH;  Surgeon: Coralie Keens, MD;  Location: [facility];  Service: General;  Laterality: N/A;   LYMPHADENECTOMY Bilateral 02/14/2017   Procedure: LYMPHADENECTOMY/PELVIC;  Surgeon: Raynelle Bring, MD;  Location: [facility];  Service: Urology;  Laterality: Bilateral;   ROBOT ASSISTED LAPAROSCOPIC RADICAL PROSTATECTOMY N/A 02/14/2017   Procedure: XI ROBOTIC ASSISTED LAPAROSCOPIC RADICAL PROSTATECTOMY LEVEL 3;  Surgeon: Raynelle Bring, MD;  Location: [facility];  Service: Urology;  Laterality: N/A;   SEPTOPLASTY     thumb surgery     TONSILLECTOMY     UPPER GASTROINTESTINAL ENDOSCOPY  10/24/2001   gastritis   UPPER GASTROINTESTINAL ENDOSCOPY  11/03/12    FAMILY HISTORY:  We obtained 4-generation family history.  Russell Thomas did not report a known family history of cancer; however, he had limited information about maternal and paternal family members.     Russell Thomas is unaware of previous family history of genetic testing for hereditary cancer risks.  There is no reported Ashkenazi Jewish ancestry.   GENETIC COUNSELING ASSESSMENT: Russell Thomas is a 61 y.o. male with a personal history of prostate cancer which is somewhat suggestive of a hereditary cancer syndrome and predisposition to cancer given his age of diagnosis and his regional node positive status. We, therefore, discussed and recommended the following at today's visit.   DISCUSSION: We discussed that 5 - 10% of cancer is hereditary.  Most cases of hereditary prostate cancer are associated with mutations in BRCA1/2.  There are other genes that can be associated with hereditary prostate cancer syndromes.  We discussed that testing is beneficial for several reasons including knowing how to follow individuals for their cancer risks, identifying whether potential treatment options would be beneficial, and understanding if other family members could be at risk for cancer and allowing them to undergo genetic testing.   We reviewed the characteristics, features and  inheritance patterns of hereditary cancer syndromes. We also discussed genetic testing, including the appropriate family members to test, the process of testing, insurance coverage and turn-around-time for results. We discussed the implications of a negative, positive, carrier and/or variant of uncertain significant result. We recommended Russell Thomas pursue genetic testing for a panel that includes genes associated  with prostate cancer and other cancers, given his limited information on family history.   The Multi-Cancer + RNA Panel offered by [facility] includes sequencing and/or deletion/duplication analysis of the following 70 genes:  AIP*, ALK, APC*, ATM*, AXIN2*, BAP1*, BARD1*, BLM*, BMPR1A*, BRCA1*, BRCA2*, BRIP1*, CDC73*, CDH1*, CDK4, CDKN1B*, CDKN2A, CHEK2*, CTNNA1*, DICER1*, EPCAM (del/dup only), EGFR, FH*, FLCN*, GREM1 (promoter dup only), HOXB13, KIT, LZTR1, MAX*, MBD4, MEN1*, MET, MITF, MLH1*, MSH2*, MSH3*, MSH6*, MUTYH*, NF1*, NF2*, NTHL1*, PALB2*, PDGFRA, PMS2*, POLD1*, POLE*, POT1*, PRKAR1A*, PTCH1*, PTEN*, RAD51C*, RAD51D*, RB1*, RET, SDHA* (sequencing only), SDHAF2*, SDHB*, SDHC*, SDHD*, SMAD4*, SMARCA4*, SMARCB1*, SMARCE1*, STK11*, SUFU*, TMEM127*, TP53*, TSC1*, TSC2*, VHL*. RNA analysis is performed for * genes.  Based on Russell Thomas's personal history of prostate cancer, he meets medical criteria for genetic testing. Despite that he meets criteria, he may still have an out of pocket cost. We discussed that if his out of pocket cost for testing is over $100, the laboratory should contact him and discuss the self-pay prices and/or patient pay assistance programs.    PLAN: After considering the risks, benefits, and limitations, Russell Thomas provided informed consent to pursue genetic testing and the blood sample was sent to [facility] for analysis of the Multi-Cancer +RNA Panel. Results should be available within approximately 3 weeks' time, at which point they will be disclosed by telephone to Russell Thomas, as will any additional recommendations warranted by these results. Russell Thomas will receive a summary of his genetic counseling visit and a copy of his results once available. This information will also be available in Epic.    Russell Thomas questions were answered to his satisfaction today. Our contact information was provided should additional questions or concerns arise. Thank you for the  referral and allowing [place] to share in the care of your patient.   Russell Thomas M. Joette Catching, [place], [facility] Genetic Counselor [email or web address] (P) [phone]  The patient was seen in conjunction with his wife's genetics appt.  Less than 15 minutes of face-to-face time was spent on his history/counseling.   Drs. Lindi Adie and/or Burr Medico were available to discuss this case as needed.  _______________________________________________________  For Office Staff:  Number of people involved in session: 2 Was an Intern/ student involved with case: no

## 2022-02-08 ENCOUNTER — Telehealth: Payer: Self-pay | Admitting: Genetic Counselor

## 2022-02-08 ENCOUNTER — Ambulatory Visit: Payer: Self-pay | Admitting: Genetic Counselor

## 2022-02-08 ENCOUNTER — Encounter: Payer: Self-pay | Admitting: Genetic Counselor

## 2022-02-08 DIAGNOSIS — C61 Malignant neoplasm of prostate: Secondary | ICD-10-CM

## 2022-02-08 DIAGNOSIS — Z1379 Encounter for other screening for genetic and chromosomal anomalies: Secondary | ICD-10-CM | POA: Insufficient documentation

## 2022-02-08 NOTE — Telephone Encounter (Signed)
Revealed negative genetics and VUS in CDH1 and POLD1.

## 2022-02-09 ENCOUNTER — Telehealth: Payer: Self-pay | Admitting: Genetic Counselor

## 2022-02-09 NOTE — Telephone Encounter (Signed)
Answered questions about VUS results.  Answered Russell Thomas's questions about chances of de novo mutation.

## 2022-02-16 DIAGNOSIS — C61 Malignant neoplasm of prostate: Secondary | ICD-10-CM | POA: Diagnosis not present

## 2022-02-16 DIAGNOSIS — C775 Secondary and unspecified malignant neoplasm of intrapelvic lymph nodes: Secondary | ICD-10-CM | POA: Diagnosis not present

## 2022-02-16 DIAGNOSIS — N5201 Erectile dysfunction due to arterial insufficiency: Secondary | ICD-10-CM | POA: Diagnosis not present

## 2022-02-28 NOTE — Progress Notes (Signed)
HPI:   Russell Thomas was previously seen in the [place] clinic due to a personal history of prostate cancer and concerns regarding a hereditary predisposition to cancer. Please refer to our prior cancer genetics clinic note for more information regarding our discussion, assessment and recommendations, at the time. Russell Thomas recent genetic test results were disclosed to him, as were recommendations warranted by these results. These results and recommendations are discussed in more detail below.  CANCER HISTORY:  In 2019, at the Thomas of 62, Russell Thomas was diagnosed with adenocarcinoma of the of the prostate (Gleason 3+4=7) s/p prostatectomy.  Metastatic carcinoma present in two right pelvic lymph nodes.      FAMILY HISTORY:  We obtained 4-generation family history.  Russell Thomas did not report a known family history of cancer; however, he had limited information about maternal and paternal family members.       Russell Thomas is unaware of previous family history of genetic testing for hereditary cancer risks.  There is no reported Ashkenazi Jewish ancestry.   GENETIC TEST RESULTS:  The [facility] Multi-Cancer +RNA Panel found no pathogenic mutations. The Multi-Cancer + RNA Panel offered by [facility] includes sequencing and/or deletion/duplication analysis of the following 70 genes:  AIP*, ALK, APC*, ATM*, AXIN2*, BAP1*, BARD1*, BLM*, BMPR1A*, BRCA1*, BRCA2*, BRIP1*, CDC73*, CDH1*, CDK4, CDKN1B*, CDKN2A, CHEK2*, CTNNA1*, DICER1*, EPCAM (del/dup only), EGFR, FH*, FLCN*, GREM1 (promoter dup only), HOXB13, KIT, LZTR1, MAX*, MBD4, MEN1*, MET, MITF, MLH1*, MSH2*, MSH3*, MSH6*, MUTYH*, NF1*, NF2*, NTHL1*, PALB2*, PDGFRA, PMS2*, POLD1*, POLE*, POT1*, PRKAR1A*, PTCH1*, PTEN*, RAD51C*, RAD51D*, RB1*, RET, SDHA* (sequencing only), SDHAF2*, SDHB*, SDHC*, SDHD*, SMAD4*, SMARCA4*, SMARCB1*, SMARCE1*, STK11*, SUFU*, TMEM127*, TP53*, TSC1*, TSC2*, VHL*. RNA analysis is performed for * genes.  The test  report has been scanned into EPIC and is located under the Molecular Pathology section of the Results Review tab.  A portion of the result report is included below for reference. Genetic testing reported out on February 06, 2022.     Genetic testing did identify two variants of uncertain significance (VUS) - one in the CDH1 gene called c.1178T>A (p.Ile393Asn) and a second in the POLD1 gene called c.2953C>T (p.Arg985Trp). At this time, it is unknown if these variants are associated with increased cancer risk or if they are normal findings, but most variants such as these get reclassified to being inconsequential. They should not be used to make medical management decisions. With time, we suspect the lab will determine the significance of these variants, if any. If we do learn more about them, we will try to contact Russell Thomas to discuss it further. However, it is important to stay in touch with [place] periodically and keep the address and phone number up to date.   Even though a pathogenic variant was not identified, possible explanations for the cancer in the family may include: There may be no hereditary risk for cancer in the family. The cancers in Russell Thomas and/or his family may be sporadic/familial or due to other genetic and environmental factors. There may be a gene mutation in one of these genes that current testing methods cannot detect but that chance is small. There could be another gene that has not yet been discovered, or that we have not yet tested, that is responsible for the cancer diagnoses in the family.  It is also possible there is a hereditary cause for the cancer in the family that Russell Thomas did not inherit.   Therefore, it is important to remain  in touch with cancer genetics in the future so that we can continue to offer Russell Thomas the most up to date genetic testing.    ADDITIONAL GENETIC TESTING:  We discussed with Russell Thomas that his genetic testing was fairly extensive.   If there are additional relevant genes identified to increase cancer risk that can be analyzed in the future, we would be happy to discuss and coordinate this testing at that time.     CANCER SCREENING RECOMMENDATIONS:  Russell Thomas test result is considered negative (normal).  This means that we have not identified a hereditary cause for his personal history of prostate cancer at this time.    An individual's cancer risk and medical management are not determined by genetic test results alone. Overall cancer risk assessment incorporates additional factors, including personal medical history, family history, and any available genetic information that may result in a personalized plan for cancer prevention and surveillance. Therefore, it is recommended he continue to follow the cancer management and screening guidelines provided by his oncology and primary healthcare provider.  RECOMMENDATIONS FOR FAMILY MEMBERS:   Since he did not inherit a identifiable mutation in a cancer predisposition gene included on this panel, his son could not have inherited a known mutation from him in one of these genes. Individuals in this family might be at some increased risk of developing cancer, over the general population risk, due to the family history of cancer.  Individuals in the family should notify their providers of the family history of cancer. We recommend women in this family have a yearly mammogram beginning at Thomas 89, or 59 years younger than the earliest onset of cancer, an annual clinical breast exam, and perform monthly breast self-exams.  Males in the family should speak with their providers about considering prostate cancer screening at an appropriate Thomas.  We do not recommend familial testing for the CDH1 and POLD1 variants of uncertain significance (VUS).  FOLLOW-UP:  Lastly, we discussed with Russell Thomas that cancer genetics is a rapidly advancing field and it is possible that new genetic tests will  be appropriate for him and/or his family members in the future. We encouraged him to remain in contact with cancer genetics on an annual basis so we can update his personal and family histories and let him know of advances in cancer genetics that may benefit this family.   Our contact number was provided. Russell Thomas questions were answered to his satisfaction, and he knows he is welcome to call us at anytime with additional questions or concerns.   Arianna Haydon M. Joette Catching, [place], [facility] Genetic Counselor [email or web address] (P) [phone]

## 2022-03-07 DIAGNOSIS — G4733 Obstructive sleep apnea (adult) (pediatric): Secondary | ICD-10-CM | POA: Diagnosis not present

## 2022-03-15 DIAGNOSIS — G4733 Obstructive sleep apnea (adult) (pediatric): Secondary | ICD-10-CM | POA: Diagnosis not present

## 2022-03-15 DIAGNOSIS — R7303 Prediabetes: Secondary | ICD-10-CM | POA: Diagnosis not present

## 2022-03-15 DIAGNOSIS — K219 Gastro-esophageal reflux disease without esophagitis: Secondary | ICD-10-CM | POA: Diagnosis not present

## 2022-03-15 DIAGNOSIS — I10 Essential (primary) hypertension: Secondary | ICD-10-CM | POA: Diagnosis not present

## 2022-03-16 DIAGNOSIS — R1900 Intra-abdominal and pelvic swelling, mass and lump, unspecified site: Secondary | ICD-10-CM | POA: Diagnosis not present

## 2022-03-22 ENCOUNTER — Telehealth: Payer: Self-pay | Admitting: Genetic Counselor

## 2022-03-22 DIAGNOSIS — R5383 Other fatigue: Secondary | ICD-10-CM | POA: Diagnosis not present

## 2022-03-22 DIAGNOSIS — R1011 Right upper quadrant pain: Secondary | ICD-10-CM | POA: Diagnosis not present

## 2022-03-22 DIAGNOSIS — K625 Hemorrhage of anus and rectum: Secondary | ICD-10-CM | POA: Diagnosis not present

## 2022-03-22 DIAGNOSIS — R140 Abdominal distension (gaseous): Secondary | ICD-10-CM | POA: Diagnosis not present

## 2022-03-22 NOTE — Telephone Encounter (Signed)
Patient called with billing issues w/ [facility].  Called [facility] Billing Team---they are sending clinical notes over to insurance company to review claim.  LVM w/ patient detailing conversation.

## 2022-03-30 DIAGNOSIS — K429 Umbilical hernia without obstruction or gangrene: Secondary | ICD-10-CM | POA: Diagnosis not present

## 2022-03-30 DIAGNOSIS — K760 Fatty (change of) liver, not elsewhere classified: Secondary | ICD-10-CM | POA: Diagnosis not present

## 2022-04-03 ENCOUNTER — Other Ambulatory Visit: Payer: Self-pay | Admitting: Surgery

## 2022-04-03 DIAGNOSIS — K432 Incisional hernia without obstruction or gangrene: Secondary | ICD-10-CM | POA: Diagnosis not present

## 2022-04-03 DIAGNOSIS — K4090 Unilateral inguinal hernia, without obstruction or gangrene, not specified as recurrent: Secondary | ICD-10-CM | POA: Diagnosis not present

## 2022-04-17 DIAGNOSIS — M545 Low back pain, unspecified: Secondary | ICD-10-CM | POA: Diagnosis not present

## 2022-04-17 DIAGNOSIS — Z8546 Personal history of malignant neoplasm of prostate: Secondary | ICD-10-CM | POA: Diagnosis not present

## 2022-04-17 DIAGNOSIS — K219 Gastro-esophageal reflux disease without esophagitis: Secondary | ICD-10-CM | POA: Diagnosis not present

## 2022-04-23 DIAGNOSIS — R8290 Unspecified abnormal findings in urine: Secondary | ICD-10-CM | POA: Diagnosis not present

## 2022-04-23 DIAGNOSIS — I10 Essential (primary) hypertension: Secondary | ICD-10-CM | POA: Diagnosis not present

## 2022-04-23 DIAGNOSIS — M546 Pain in thoracic spine: Secondary | ICD-10-CM | POA: Diagnosis not present

## 2022-05-02 DIAGNOSIS — K5730 Diverticulosis of large intestine without perforation or abscess without bleeding: Secondary | ICD-10-CM | POA: Diagnosis not present

## 2022-05-02 DIAGNOSIS — D123 Benign neoplasm of transverse colon: Secondary | ICD-10-CM | POA: Diagnosis not present

## 2022-05-02 DIAGNOSIS — K648 Other hemorrhoids: Secondary | ICD-10-CM | POA: Diagnosis not present

## 2022-05-02 DIAGNOSIS — Z86010 Personal history of colonic polyps: Secondary | ICD-10-CM | POA: Diagnosis not present

## 2022-05-02 DIAGNOSIS — K921 Melena: Secondary | ICD-10-CM | POA: Diagnosis not present

## 2022-05-02 LAB — HM COLONOSCOPY

## 2022-06-07 DIAGNOSIS — G4733 Obstructive sleep apnea (adult) (pediatric): Secondary | ICD-10-CM | POA: Diagnosis not present

## 2022-06-12 DIAGNOSIS — H9209 Otalgia, unspecified ear: Secondary | ICD-10-CM | POA: Diagnosis not present

## 2022-10-16 ENCOUNTER — Encounter: Payer: Self-pay | Admitting: Physician Assistant

## 2022-10-16 ENCOUNTER — Ambulatory Visit (HOSPITAL_BASED_OUTPATIENT_CLINIC_OR_DEPARTMENT_OTHER)
Admission: RE | Admit: 2022-10-16 | Discharge: 2022-10-16 | Disposition: A | Discharge status: Home / Self Care | Payer: PRIVATE HEALTH INSURANCE | Source: Ambulatory Visit | Attending: Physician Assistant | Admitting: Physician Assistant

## 2022-10-16 ENCOUNTER — Other Ambulatory Visit: Payer: Self-pay

## 2022-10-16 ENCOUNTER — Ambulatory Visit (INDEPENDENT_AMBULATORY_CARE_PROVIDER_SITE_OTHER): Payer: PRIVATE HEALTH INSURANCE | Admitting: Physician Assistant

## 2022-10-16 VITALS — BP 140/88 | HR 85 | Temp 99.0°F | Resp 20 | Ht 73.0 in | Wt 314.0 lb

## 2022-10-16 DIAGNOSIS — E782 Mixed hyperlipidemia: Secondary | ICD-10-CM | POA: Diagnosis not present

## 2022-10-16 DIAGNOSIS — G4700 Insomnia, unspecified: Secondary | ICD-10-CM | POA: Diagnosis not present

## 2022-10-16 DIAGNOSIS — R519 Headache, unspecified: Secondary | ICD-10-CM

## 2022-10-16 DIAGNOSIS — Z6841 Body Mass Index (BMI) 40.0 and over, adult: Secondary | ICD-10-CM

## 2022-10-16 DIAGNOSIS — G4733 Obstructive sleep apnea (adult) (pediatric): Secondary | ICD-10-CM

## 2022-10-16 DIAGNOSIS — M542 Cervicalgia: Secondary | ICD-10-CM | POA: Insufficient documentation

## 2022-10-16 DIAGNOSIS — Z8546 Personal history of malignant neoplasm of prostate: Secondary | ICD-10-CM

## 2022-10-16 DIAGNOSIS — G8929 Other chronic pain: Secondary | ICD-10-CM | POA: Diagnosis not present

## 2022-10-16 DIAGNOSIS — R739 Hyperglycemia, unspecified: Secondary | ICD-10-CM

## 2022-10-16 DIAGNOSIS — I10 Essential (primary) hypertension: Secondary | ICD-10-CM | POA: Insufficient documentation

## 2022-10-16 MED ORDER — TRAZODONE HCL 50 MG PO TABS: 50.0000 mg | ORAL_TABLET | Freq: Every evening | ORAL | 3 refills | Status: DC | PRN

## 2022-10-16 MED ORDER — BACLOFEN 10 MG PO TABS
10.0000 mg | ORAL_TABLET | Freq: Three times a day (TID) | ORAL | 0 refills | Status: DC
Start: 2022-10-16 — End: 2023-01-24

## 2022-10-16 NOTE — Assessment & Plan Note (Signed)
-

## 2022-10-16 NOTE — Assessment & Plan Note (Signed)
Patient reports use of Lorazepam as needed for sleep. Discussed potential long-term risks of Lorazepam use including dementia. -Discontinue Lorazepam. -Prescribe Trazodone 50 mg prn for sleep.

## 2022-10-16 NOTE — Assessment & Plan Note (Signed)
-  limited ROM on exam. -Order neck X-ray. -Refer to physical therapy. -Prescribe baclofen prn, recommending heat

## 2022-10-16 NOTE — Progress Notes (Signed)
New patient visit   Patient: Russell Thomas   DOB: 1960-06-02   62 y.o. Male  MRN: [id number] Visit Date: 10/16/2022  Today's healthcare provider: Alfredia Ferguson, PA-C   Cc. New patient, headache, neck pain  Subjective    Russell Thomas is a 62 y.o. male who presents today as a new patient to establish care.   HPI Discussed the use of AI scribe software for clinical note transcription with the patient, who gave verbal consent to proceed.  History of Present Illness   The patient, a male with a history of prostate cancer s/p prostatectomy in 2019, presents with neck pain and headaches for the last few weeks. He reports that the neck pain is across the back of his head, and radiate to a headache at the top left side of his head. The neck pain  is associated with a grinding sensation and a liquid sound, even when he is not moving. The patient believes that his symptoms may be related to stress, as his wife has been battling uterine cancer and there is a possibility of recurrence. Denies vision changes, numbness/paresthesias.  In addition to the neck pain, the patient has noticed weight gain and elevated blood pressure. He admits to being a stress eater and has been less active due to working from home. He has a history of taking losartan for high blood pressure but has not been on the medication recently. He has a home blood pressure monitor and has noticed an increase in his readings.  The patient also mentions that he has sleep apnea and uses a CPAP machine nightly.    He takes lorazepam as needed to help with sleep.        Past Medical History:  Diagnosis Date   Adenomatous colon polyp 2008   Dr Arlyce Dice, GI   Allergic rhinitis    Anxiety    Diverticulosis    Gallbladder attack 2015   stones present   Gastritis    Hyperlipemia 2009   IBS (irritable bowel syndrome)    Internal hemorrhoids 2012   banded    Lichen planus    Pre-diabetes    Prostate cancer (HCC)     Seasonal allergies    Sleep apnea    [facility]   Past Surgical History:  Procedure Laterality Date   BIOPSY PROSTATE  12/03/2016   COLONOSCOPY  04/10/2010, 08/12/2006   3/12 with banding; 2008: adenomatous polyp, diverticulosis   INCISIONAL HERNIA REPAIR N/A 07/18/2020   Procedure: LAPAROSCOPIC INCISIONAL HERNIA REPAIR WITH MESH;  Surgeon: Abigail Miyamoto, MD;  Location: [facility];  Service: General;  Laterality: N/A;   LYMPHADENECTOMY Bilateral 02/14/2017   Procedure: LYMPHADENECTOMY/PELVIC;  Surgeon: Heloise Purpura, MD;  Location: [facility];  Service: Urology;  Laterality: Bilateral;   ROBOT ASSISTED LAPAROSCOPIC RADICAL PROSTATECTOMY N/A 02/14/2017   Procedure: XI ROBOTIC ASSISTED LAPAROSCOPIC RADICAL PROSTATECTOMY LEVEL 3;  Surgeon: Heloise Purpura, MD;  Location: [facility];  Service: Urology;  Laterality: N/A;   SEPTOPLASTY     thumb surgery     TONSILLECTOMY     UPPER GASTROINTESTINAL ENDOSCOPY  10/24/2001   gastritis   UPPER GASTROINTESTINAL ENDOSCOPY  11/03/12   Family Status  Relation Name Status   Mother  Alive   Father  Alive   PGF  (Not Specified)  No partnership data on file   Family History  Problem Relation Age of Onset   Thyroid disease Mother        hypothyroidism  Allergies Mother    Parkinson's disease Mother        not definite   Atrial fibrillation Mother    Colitis Father    Alcohol abuse Paternal Grandfather    Social History   Socioeconomic History   Marital status: Married    Spouse name: Not on file   Number of children: 1   Years of education: Not on file   Highest education level: Not on file  Occupational History   Occupation: Investment banker, corporate: [employer]   Tobacco Use   Smoking status: Former    Types: Cigars   Smokeless tobacco: Never   Tobacco comments:     RARE ; <1 -2 cigar / YEAR  Vaping Use   Vaping status: Never Used  Substance and Sexual Activity   Alcohol use: Yes    Comment: rare ; 0-1 drink / month   Drug use: No    Sexual activity: Yes  Other Topics Concern   Not on file  Social History Narrative   Married, lives with spouse Lawson Fiscal- Pharm D) and 1 son    Work in Airline pilot   Wears his seatbelt, 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       Social Determinants of Health   Financial Resource Strain: Low Risk  (01/08/2022)   Overall Financial Resource Strain (CARDIA)    Difficulty of Paying Living Expenses: Not hard at all  Food Insecurity: No Food Insecurity (01/08/2022)   Hunger Vital Sign    Worried About Running Out of Food in the Last Year: Never true    Ran Out of Food in the Last Year: Never true  Transportation Needs: No Transportation Needs (01/08/2022)   PRAPARE - Administrator, Civil Service (Medical): No    Lack of Transportation (Non-Medical): No  Physical Activity: Not on file  Stress: Not on file  Social Connections: Unknown (06/18/2021)   Received from [facility]   Social Network    Social Network: Not on file   Outpatient Medications Prior to Visit  Medication Sig   Brimonidine Tartrate (LUMIFY) 0.025 % SOLN Place 1 drop into both eyes daily.   loratadine (CLARITIN) 10 MG tablet Take 10 mg by mouth daily.   LORazepam (ATIVAN) 1 MG tablet Take 1 mg by mouth at bedtime as needed.   mometasone (NASONEX) 50 MCG/ACT nasal spray 2 sprays by Both Nostrils route daily.   Multiple Vitamins-Minerals (CENTRUM SILVER PO) Take 1 tablet by mouth daily.   [DISCONTINUED] losartan (COZAAR) 25 MG tablet 1 tablet Orally Once a day   [DISCONTINUED] bisacodyl (DULCOLAX) 5 MG EC tablet Take 10 mg by mouth daily as needed for moderate constipation.   [DISCONTINUED] buPROPion (WELLBUTRIN XL) 150 MG 24 hr tablet Take 150 mg by mouth every morning.   [DISCONTINUED] oxyCODONE (OXY IR/ROXICODONE) 5 MG immediate release tablet Take 1 tablet (5 mg total) by mouth every 6 (six) hours as needed for moderate pain or severe pain.   [DISCONTINUED] triamcinolone (NASACORT) 55 MCG/ACT AERO nasal  inhaler Place 2 sprays into the nose daily as needed (allergies).   No facility-administered medications prior to visit.   Allergies  Allergen Reactions   Cephalosporins Hives and Rash   Ciprofloxacin Rash   Iodinated Contrast Media Hives, Itching and Rash    GADOLINIUM   Omeprazole Rash   Penicillins Itching, Rash and Other (See Comments)    Weezing, blisters Has patient had a PCN reaction causing immediate rash, facial/tongue/throat swelling, SOB  or lightheadedness with hypotension: Yes Has patient had a PCN reaction causing severe rash involving mucus membranes or skin necrosis: No Has patient had a PCN reaction that required hospitalization: No Has patient had a PCN reaction occurring within the last 10 years: No If all of the above answers are "NO", then may proceed with Cephalosporin use.    Shrimp [Shellfish Allergy] Rash   Sulfa Antibiotics Hives and Rash   Sulfonamide Derivatives Rash   Omnipaque [Iohexol] Hives and Itching    Scratchy palms, scratchy throat, face and chest redness, hives all over. (CT fluoroscopic Contrast)      Immunization History  Administered Date(s) Administered   Influenza Split 11/09/2010, 11/05/2013   Td 02/06/1991   Tdap 09/05/2010    Health Maintenance  Topic Date Due   COVID-19 Vaccine (1) Never done   Hepatitis C Screening  Never done   Zoster Vaccines- Shingrix (1 of 2) Never done   Colonoscopy  04/11/2017   DTaP/Tdap/Td (3 - Td or Tdap) 09/04/2020   INFLUENZA VACCINE  09/06/2022   HIV Screening  Completed   HPV VACCINES  Aged Out    Patient Care Team: Alfredia Ferguson, PA-C as PCP - General (Physician Assistant) Chilton Greathouse, MD as Consulting Physician (Pulmonary Disease) Louis Meckel, MD (Inactive) as Consulting Physician (Gastroenterology)  Review of Systems  Constitutional:  Negative for fatigue and fever.  Respiratory:  Negative for cough and shortness of breath.   Cardiovascular:  Negative for chest pain,  palpitations and leg swelling.  Musculoskeletal:  Positive for neck pain and neck stiffness.  Neurological:  Positive for headaches. Negative for dizziness.      Objective    BP (!) 140/88 (BP Location: Left Arm)   Pulse 85   Temp 99 F (37.2 C) (Oral)   Resp 20   Ht 6\' 1"  (1.854 m)   Wt (!) 314 lb (142.4 kg)   SpO2 98%   BMI 41.43 kg/m    Physical Exam Constitutional:      General: He is awake.     Appearance: He is well-developed.  HENT:     Head: Normocephalic.  Eyes:     Conjunctiva/sclera: Conjunctivae normal.  Neck:     Vascular: No carotid bruit.  Cardiovascular:     Rate and Rhythm: Normal rate and regular rhythm.     Heart sounds: Normal heart sounds.  Pulmonary:     Effort: Pulmonary effort is normal.     Breath sounds: Normal breath sounds.  Musculoskeletal:     Comments: Very little neck ROM d/t stiffness Some tension/tenderness occipital skull    Skin:    General: Skin is warm.  Neurological:     Mental Status: He is alert and oriented to person, place, and time.  Psychiatric:        Attention and Perception: Attention normal.        Mood and Affect: Mood normal.        Speech: Speech normal.        Behavior: Behavior is cooperative.     Depression Screen    10/16/2022    2:21 PM 06/14/2017   10:08 AM 06/14/2017    9:49 AM 11/14/2012    9:29 AM  PHQ 2/9 Scores  PHQ - 2 Score 2 0 0 2  PHQ- 9 Score 5   5   No results found for any visits on 10/16/22.  Assessment & Plan      Problem List Items Addressed This Visit  Cardiovascular and Mediastinum   Primary hypertension    Elevated blood pressure likely secondary to stress and weight gain. Previous use of Losartan. -Monitor blood pressure over the next month with lifestyle modifications (diet, stress management). -Consider initiation of Losartan if no improvement.      Relevant Orders   Comp Met (CMET)   HgB A1c     Respiratory   Obstructive sleep apnea    Chronic, pt  compliant with nightly cpap      Relevant Orders   CBC w/Diff   Comp Met (CMET)   HgB A1c     Other   Hyperlipidemia, mixed    Not currently on statin Ordered fasting lipids today      Relevant Orders   Lipid panel   Morbid obesity with BMI of 40.0-44.9, adult (HCC)    -Encourage lifestyle modifications including diet and exercise.          History of prostate cancer    S/p prostatectomy in 2019. Follows with urology yearly      Neck pain - Primary    -limited ROM on exam. -Order neck X-ray. -Refer to physical therapy. -Prescribe baclofen prn, recommending heat      Relevant Medications   baclofen (LIORESAL) 10 MG tablet   Other Relevant Orders   DG Cervical Spine Complete   Ambulatory referral to Physical Therapy   Insomnia    Patient reports use of Lorazepam as needed for sleep. Discussed potential long-term risks of Lorazepam use including dementia. -Discontinue Lorazepam. -Prescribe Trazodone 50 mg prn for sleep.      Relevant Medications   traZODone (DESYREL) 50 MG tablet   Other Visit Diagnoses     Chronic nonintractable headache, unspecified headache type       Relevant Medications   traZODone (DESYREL) 50 MG tablet   baclofen (LIORESAL) 10 MG tablet   Other Relevant Orders   HgB A1c   TSH   T4, free   Vitamin B12   Hyperglycemia       Relevant Orders   HgB A1c        Return in about 1 month (around 11/15/2022) for insomnia, hypertension.     I, Alfredia Ferguson, PA-C have reviewed all documentation for this visit. The documentation on  10/16/22   for the exam, diagnosis, procedures, and orders are all accurate and complete.    Alfredia Ferguson, PA-C  [facility] at [facility] [phone] (phone) [phone] (fax)  [facility]

## 2022-10-16 NOTE — Assessment & Plan Note (Signed)
Elevated blood pressure likely secondary to stress and weight gain. Previous use of Losartan. -Monitor blood pressure over the next month with lifestyle modifications (diet, stress management). -Consider initiation of Losartan if no improvement.

## 2022-10-16 NOTE — Assessment & Plan Note (Signed)
Not currently on statin Ordered fasting lipids today

## 2022-10-16 NOTE — Assessment & Plan Note (Signed)
S/p prostatectomy in 2019. Follows with urology yearly

## 2022-10-16 NOTE — Assessment & Plan Note (Signed)
Chronic, pt compliant with nightly cpap

## 2022-10-17 LAB — HEMOGLOBIN A1C: Hgb A1c MFr Bld: 6.3 % (ref 4.6–6.5)

## 2022-10-17 LAB — COMPREHENSIVE METABOLIC PANEL
ALT: 31 U/L (ref 0–53)
AST: 18 U/L (ref 0–37)
Albumin: 4.2 g/dL (ref 3.5–5.2)
Alkaline Phosphatase: 64 U/L (ref 39–117)
BUN: 12 mg/dL (ref 6–23)
CO2: 27 meq/L (ref 19–32)
Calcium: 8.9 mg/dL (ref 8.4–10.5)
Chloride: 102 meq/L (ref 96–112)
Creatinine, Ser: 0.94 mg/dL (ref 0.40–1.50)
GFR: 85.59 mL/min (ref >60.00)
Glucose, Bld: 77 mg/dL (ref 70–99)
Potassium: 3.9 meq/L (ref 3.5–5.1)
Sodium: 138 meq/L (ref 135–145)
Total Bilirubin: 0.6 mg/dL (ref 0.2–1.2)
Total Protein: 6.7 g/dL (ref 6.0–8.3)

## 2022-10-17 LAB — LIPID PANEL
Cholesterol: 170 mg/dL (ref 0–200)
HDL: 50.8 mg/dL (ref >39.00)
LDL Cholesterol: 97 mg/dL (ref 0–99)
NonHDL: 118.94
Total CHOL/HDL Ratio: 3
Triglycerides: 110 mg/dL (ref 0.0–149.0)
VLDL: 22 mg/dL (ref 0.0–40.0)

## 2022-10-17 LAB — CBC WITH DIFFERENTIAL/PLATELET
Basophils Absolute: 0.1 10*3/uL (ref 0.0–0.1)
Basophils Relative: 1.3 % (ref 0.0–3.0)
Eosinophils Absolute: 0.1 10*3/uL (ref 0.0–0.7)
Eosinophils Relative: 0.9 % (ref 0.0–5.0)
HCT: 45.7 % (ref 39.0–52.0)
Hemoglobin: 15 g/dL (ref 13.0–17.0)
Lymphocytes Relative: 28.9 % (ref 12.0–46.0)
Lymphs Abs: 3 10*3/uL (ref 0.7–4.0)
MCHC: 32.9 g/dL (ref 30.0–36.0)
MCV: 92.7 fl (ref 78.0–100.0)
Monocytes Absolute: 0.7 10*3/uL (ref 0.1–1.0)
Monocytes Relative: 6.7 % (ref 3.0–12.0)
Neutro Abs: 6.5 10*3/uL (ref 1.4–7.7)
Neutrophils Relative %: 62.2 % (ref 43.0–77.0)
Platelets: 229 10*3/uL (ref 150.0–400.0)
RBC: 4.93 Mil/uL (ref 4.22–5.81)
RDW: 13.8 % (ref 11.5–15.5)
WBC: 10.5 10*3/uL (ref 4.0–10.5)

## 2022-10-17 LAB — T4, FREE: Free T4: 0.86 ng/dL (ref 0.60–1.60)

## 2022-10-17 LAB — VITAMIN B12: Vitamin B-12: 621 pg/mL (ref 211–911)

## 2022-10-18 ENCOUNTER — Encounter: Payer: Self-pay | Admitting: Physician Assistant

## 2022-10-25 NOTE — Telephone Encounter (Signed)
Spoke with Riki Rusk in the read room and states he will get this information escalated.

## 2022-10-30 NOTE — Therapy (Addendum)
OUTPATIENT PHYSICAL THERAPY CERVICAL EVALUATION AND DISCHARGE SUMMARY   Patient Name: Russell Thomas MRN: [id number] DOB:01/15/1961, 62 y.o., male Today's Date: 10/31/2022  END OF SESSION:  PT End of Session - 10/31/22 1103     Visit Number 1    Date for PT Re-Evaluation 12/26/22    Authorization Type Generic Commercial    PT Start Time 1104    PT Stop Time 1144    PT Time Calculation (min) 40 min    Activity Tolerance Patient tolerated treatment well    Behavior During Therapy [facility] for tasks assessed/performed             Past Medical History:  Diagnosis Date   Adenomatous colon polyp 2008   Dr Arlyce Dice, GI   Allergic rhinitis    Anxiety    Diverticulosis    Gallbladder attack 2015   stones present   Gastritis    Hyperlipemia 2009   IBS (irritable bowel syndrome)    Internal hemorrhoids 2012   banded    Lichen planus    Pre-diabetes    Prostate cancer (HCC)    Seasonal allergies    Sleep apnea    [facility]   Past Surgical History:  Procedure Laterality Date   BIOPSY PROSTATE  12/03/2016   COLONOSCOPY  04/10/2010, 08/12/2006   3/12 with banding; 2008: adenomatous polyp, diverticulosis   INCISIONAL HERNIA REPAIR N/A 07/18/2020   Procedure: LAPAROSCOPIC INCISIONAL HERNIA REPAIR WITH MESH;  Surgeon: Abigail Miyamoto, MD;  Location: [facility];  Service: General;  Laterality: N/A;   LYMPHADENECTOMY Bilateral 02/14/2017   Procedure: LYMPHADENECTOMY/PELVIC;  Surgeon: Heloise Purpura, MD;  Location: [facility];  Service: Urology;  Laterality: Bilateral;   ROBOT ASSISTED LAPAROSCOPIC RADICAL PROSTATECTOMY N/A 02/14/2017   Procedure: XI ROBOTIC ASSISTED LAPAROSCOPIC RADICAL PROSTATECTOMY LEVEL 3;  Surgeon: Heloise Purpura, MD;  Location: [facility];  Service: Urology;  Laterality: N/A;   SEPTOPLASTY     thumb surgery     TONSILLECTOMY     UPPER GASTROINTESTINAL ENDOSCOPY  10/24/2001   gastritis   UPPER GASTROINTESTINAL ENDOSCOPY  11/03/12   Patient Active Problem  List   Diagnosis Date Noted   Primary hypertension 10/16/2022   Neck pain 10/16/2022   Insomnia 10/16/2022   S/P laparoscopic hernia repair 07/18/2020   History of prostate cancer 02/14/2017   Abnormal SPEP 09/30/2015   Morbid obesity with BMI of 40.0-44.9, adult (HCC) 01/20/2014   Cholelithiasis 06/01/2013   Hx of lichen planus 11/14/2012   GERD (gastroesophageal reflux disease) 10/21/2012   Hyperlipidemia, mixed 06/08/2009   History of colonic polyps 06/08/2009   Obstructive sleep apnea 07/15/2007   DIVERTICULOSIS, COLON 06/07/2007   ANXIETY STATE, UNSPECIFIED 05/07/2007    PCP: Alfredia Ferguson, PA-C   REFERRING PROVIDER: Alfredia Ferguson, PA-C   REFERRING DIAG: M54.2 (ICD-10-CM) - Neck pain   THERAPY DIAG:  Cervicalgia  Abnormal posture  Rationale for Evaluation and Treatment: Rehabilitation  ONSET DATE: months ago  SUBJECTIVE:  SUBJECTIVE STATEMENT: Thinks it's work related, stress and sitting at his desk at computer. Also works with an Ipad flat on the table but has devices to help correct this. Getting grinding with turning head and almost like a liquid feeling. Decreased mobility with rotation when driving. Getting HA more often which may be neck or blood pressure related.  Hand dominance: Right  PERTINENT HISTORY:  Left thumb pain fracture when he was young. H/o prostate CA.   PAIN:  Are you having pain? Yes: NPRS scale: 1-4/10 Pain location: back of head and comes around to left side Pain description: dull knawing pain Aggravating factors: getting tense at desk Relieving factors: nap 10-15 min  PRECAUTIONS: None  RED FLAGS: None     WEIGHT BEARING RESTRICTIONS: No  FALLS:  Has patient fallen in last 6 months? No  LIVING ENVIRONMENT: Lives with: lives  with their spouse Lives in: House/apartment  OCCUPATION: Sales of packaging products, works from home  PLOF: Independent  PATIENT GOALS: learn how to decrease pain   NEXT MD VISIT: unsure  OBJECTIVE:   DIAGNOSTIC FINDINGS:  IMPRESSION: Multilevel cervical spondylosis, most pronounced at the C5-6 and C6-7 levels. No acute findings.  PATIENT SURVEYS:  NDI 12 / 50 = 24.0 %  COGNITION: Overall cognitive status: Within functional limits for tasks assessed  SENSATION: WFL  POSTURE: rounded shoulders, forward head, and increased thoracic kyphosis  PALPATION: Decreased thoracic spine mobility , increased muscle tension suboccipitals  CERVICAL ROM: feels grinding with all motions, mild pain  Active ROM A/PROM (deg) eval  Flexion   Extension 30  Right lateral flexion 6  Left lateral flexion 12  Right rotation 33  Left rotation 37   (Blank rows = not tested)  UPPER EXTREMITY ROM: WFL  UPPER EXTREMITY MMT: not tested  CERVICAL SPECIAL TESTS:  Spurling's test: Negative and Distraction test: feels good Distraction with rotation x 5 each way - less grinding    TODAY'S TREATMENT:                                                                                                                              DATE:   10/31/22 See pt ed and HEP   PATIENT EDUCATION:  Education details: PT eval findings, anticipated POC, initial HEP, and postural awareness  Person educated: Patient Education method: Explanation, Demonstration, and Handouts Education comprehension: verbalized understanding and returned demonstration  HOME EXERCISE PROGRAM: Access Code: [email or web address] URL: [email or web address] Date: 10/31/2022 Prepared by: Raynelle Fanning  Exercises - Seated Cervical Rotation AROM  - 2 x daily - 7 x weekly - 1 sets - 10 reps - 5 hold - Seated Scapular Retraction  - 2 x daily - 7 x weekly - 1-3 sets - 10 reps - 2-3 sec hold - Seated Cervical Retraction  - 2 x daily - 7 x  weekly - 1 sets - 10 reps - 5 hold - Seated Cervical Sidebending AROM  - 2 x daily -  7 x weekly - 1 sets - 10 reps - 5 hold - Seated Cervical Extension AROM  - 2 x daily - 7 x weekly - 1 sets - 10 reps - 5 hold - Doorway Pec Stretch at 90 Degrees Abduction  - 2 x daily - 7 x weekly - 1 sets - 3 reps - 30-60 seconds hold  ASSESSMENT:  CLINICAL IMPRESSION: Patient is a 62 y.o. male who was seen today for physical therapy evaluation and treatment for neck pain starting a few months ago. He complains of grinding and feeling of liquid in his ears with neck rotation and extension. He has deficits in posture, spinal mobility and neck ROM affecting driving and his work. He sits at a computer for his job. He also complains of HA mainly to the left side of the head. He will benefit from skilled PT to address these deficits.    OBJECTIVE IMPAIRMENTS: decreased ROM, decreased strength, increased muscle spasms, impaired flexibility, postural dysfunction, and pain.   ACTIVITY LIMITATIONS:  ADLs requiring neck rotation  PARTICIPATION LIMITATIONS: driving and occupation  PERSONAL FACTORS: Age, Fitness, and Profession are also affecting patient's functional outcome.   REHAB POTENTIAL: Excellent  CLINICAL DECISION MAKING: Stable/uncomplicated  EVALUATION COMPLEXITY: Low   GOALS: Goals reviewed with patient? Yes  SHORT TERM GOALS: Target date: 11/28/2022   Patient will be independent with initial HEP.  Baseline:  Goal status: INITIAL   LONG TERM GOALS: Target date: 12/26/2022   Patient will be independent with advanced/ongoing HEP to improve outcomes and carryover.  Baseline:  Goal status: INITIAL  2.  Patient will report 75% improvement in neck pain to improve QOL.  Baseline:  Goal status: INITIAL  3.  Patient will demonstrate functional pain free cervical ROM for safety with driving.  Baseline:  Goal status: INITIAL  4.  Patient will demonstrate improved posture to decrease muscle  imbalance. Baseline:  Goal status: INITIAL  5.  Patient will report 9% on NDI to demonstrate improved functional ability.  Baseline: 12 / 50 = 24.0 % Goal status: INITIAL    PLAN:  PT FREQUENCY: 2x/week  PT DURATION: 8 weeks  PLANNED INTERVENTIONS: Therapeutic exercises, Therapeutic activity, Neuromuscular re-education, Patient/Family education, Self Care, Joint mobilization, Dry Needling, Electrical stimulation, Spinal mobilization, Cryotherapy, Moist heat, Traction, and Manual therapy  PLAN FOR NEXT SESSION: Review and progress HEP, Cervical ROM, Postural strength, trial of mechanical cervical traction.   Solon Palm, PT  10/31/2022, 12:13 PM   PHYSICAL THERAPY DISCHARGE SUMMARY  Visits from Start of Care: 1  Current functional level related to goals / functional outcomes: unknown   Remaining deficits: unknown   Education / Equipment: HEP   Patient agrees to discharge. Patient goals were not met. Patient is being discharged due to not returning since the last visit.  Solon Palm, PT 12/05/22 12:21 PM [facility] [address] [place], [place] [address] [phone]  Fax: [phone]

## 2022-10-31 ENCOUNTER — Encounter: Payer: Self-pay | Admitting: Physical Therapy

## 2022-10-31 ENCOUNTER — Ambulatory Visit: Payer: PRIVATE HEALTH INSURANCE | Attending: Physician Assistant | Admitting: Physical Therapy

## 2022-10-31 ENCOUNTER — Other Ambulatory Visit: Payer: Self-pay

## 2022-10-31 DIAGNOSIS — M542 Cervicalgia: Secondary | ICD-10-CM | POA: Diagnosis present

## 2022-10-31 DIAGNOSIS — R293 Abnormal posture: Secondary | ICD-10-CM | POA: Diagnosis present

## 2022-11-08 ENCOUNTER — Ambulatory Visit: Payer: PRIVATE HEALTH INSURANCE | Admitting: Physician Assistant

## 2022-11-12 ENCOUNTER — Encounter: Payer: Self-pay | Admitting: Physician Assistant

## 2022-11-13 ENCOUNTER — Ambulatory Visit: Payer: PRIVATE HEALTH INSURANCE

## 2022-12-03 ENCOUNTER — Ambulatory Visit: Payer: BC Managed Care – PPO | Admitting: Family Medicine

## 2022-12-11 ENCOUNTER — Observation Stay (HOSPITAL_COMMUNITY)
Admission: EM | Admit: 2022-12-11 | Discharge: 2022-12-13 | Disposition: A | Discharge status: Home / Self Care | Payer: PRIVATE HEALTH INSURANCE | Attending: Cardiology | Admitting: Cardiology

## 2022-12-11 ENCOUNTER — Emergency Department (HOSPITAL_COMMUNITY): Payer: PRIVATE HEALTH INSURANCE

## 2022-12-11 ENCOUNTER — Encounter (HOSPITAL_COMMUNITY): Payer: Self-pay

## 2022-12-11 ENCOUNTER — Other Ambulatory Visit: Payer: Self-pay

## 2022-12-11 ENCOUNTER — Observation Stay (HOSPITAL_COMMUNITY): Payer: PRIVATE HEALTH INSURANCE

## 2022-12-11 ENCOUNTER — Other Ambulatory Visit (HOSPITAL_COMMUNITY): Payer: Self-pay | Admitting: Nurse Practitioner

## 2022-12-11 DIAGNOSIS — I10 Essential (primary) hypertension: Secondary | ICD-10-CM | POA: Diagnosis not present

## 2022-12-11 DIAGNOSIS — E66813 Obesity, class 3: Secondary | ICD-10-CM | POA: Diagnosis not present

## 2022-12-11 DIAGNOSIS — G4700 Insomnia, unspecified: Secondary | ICD-10-CM | POA: Insufficient documentation

## 2022-12-11 DIAGNOSIS — F1090 Alcohol use, unspecified, uncomplicated: Secondary | ICD-10-CM | POA: Insufficient documentation

## 2022-12-11 DIAGNOSIS — Z6841 Body Mass Index (BMI) 40.0 and over, adult: Secondary | ICD-10-CM | POA: Insufficient documentation

## 2022-12-11 DIAGNOSIS — Z7901 Long term (current) use of anticoagulants: Secondary | ICD-10-CM | POA: Insufficient documentation

## 2022-12-11 DIAGNOSIS — G4733 Obstructive sleep apnea (adult) (pediatric): Secondary | ICD-10-CM | POA: Insufficient documentation

## 2022-12-11 DIAGNOSIS — I639 Cerebral infarction, unspecified: Secondary | ICD-10-CM | POA: Diagnosis not present

## 2022-12-11 DIAGNOSIS — Z79899 Other long term (current) drug therapy: Secondary | ICD-10-CM | POA: Diagnosis not present

## 2022-12-11 DIAGNOSIS — G459 Transient cerebral ischemic attack, unspecified: Principal | ICD-10-CM

## 2022-12-11 DIAGNOSIS — R4701 Aphasia: Secondary | ICD-10-CM | POA: Diagnosis present

## 2022-12-11 DIAGNOSIS — Z87891 Personal history of nicotine dependence: Secondary | ICD-10-CM | POA: Insufficient documentation

## 2022-12-11 DIAGNOSIS — R2990 Unspecified symptoms and signs involving the nervous system: Secondary | ICD-10-CM | POA: Diagnosis present

## 2022-12-11 DIAGNOSIS — F411 Generalized anxiety disorder: Secondary | ICD-10-CM | POA: Insufficient documentation

## 2022-12-11 LAB — PROTIME-INR
INR: 1 (ref 0.8–1.2)
Prothrombin Time: 13.1 s (ref 11.4–15.2)

## 2022-12-11 LAB — CBC
HCT: 46.4 % (ref 39.0–52.0)
Hemoglobin: 15.9 g/dL (ref 13.0–17.0)
MCH: 30.8 pg (ref 26.0–34.0)
MCHC: 34.3 g/dL (ref 30.0–36.0)
MCV: 89.7 fL (ref 80.0–100.0)
Platelets: 222 10*3/uL (ref 150–400)
RBC: 5.17 MIL/uL (ref 4.22–5.81)
RDW: 12.8 % (ref 11.5–15.5)
WBC: 9.7 10*3/uL (ref 4.0–10.5)
nRBC: 0 % (ref 0.0–0.2)

## 2022-12-11 LAB — DIFFERENTIAL
Abs Immature Granulocytes: 0.03 10*3/uL (ref 0.00–0.07)
Basophils Absolute: 0 10*3/uL (ref 0.0–0.1)
Basophils Relative: 0 %
Eosinophils Absolute: 0 10*3/uL (ref 0.0–0.5)
Eosinophils Relative: 0 %
Immature Granulocytes: 0 %
Lymphocytes Relative: 24 %
Lymphs Abs: 2.4 10*3/uL (ref 0.7–4.0)
Monocytes Absolute: 0.7 10*3/uL (ref 0.1–1.0)
Monocytes Relative: 7 %
Neutro Abs: 6.6 10*3/uL (ref 1.7–7.7)
Neutrophils Relative %: 69 %

## 2022-12-11 LAB — COMPREHENSIVE METABOLIC PANEL
ALT: 31 U/L (ref 0–44)
AST: 21 U/L (ref 15–41)
Albumin: 4.1 g/dL (ref 3.5–5.0)
Alkaline Phosphatase: 60 U/L (ref 38–126)
Anion gap: 11 (ref 5–15)
BUN: 13 mg/dL (ref 8–23)
CO2: 22 mmol/L (ref 22–32)
Calcium: 9 mg/dL (ref 8.9–10.3)
Chloride: 107 mmol/L (ref 98–111)
Creatinine, Ser: 0.99 mg/dL (ref 0.61–1.24)
GFR, Estimated: >60 mL/min (ref >60)
Glucose, Bld: 92 mg/dL (ref 70–99)
Potassium: 4.1 mmol/L (ref 3.5–5.1)
Sodium: 140 mmol/L (ref 135–145)
Total Bilirubin: 0.7 mg/dL (ref <1.2)
Total Protein: 6.9 g/dL (ref 6.5–8.1)

## 2022-12-11 LAB — I-STAT CHEM 8, ED
BUN: 16 mg/dL (ref 8–23)
Calcium, Ion: 1.04 mmol/L — ABNORMAL LOW (ref 1.15–1.40)
Chloride: 106 mmol/L (ref 98–111)
Creatinine, Ser: 1 mg/dL (ref 0.61–1.24)
Glucose, Bld: 88 mg/dL (ref 70–99)
HCT: 47 % (ref 39.0–52.0)
Hemoglobin: 16 g/dL (ref 13.0–17.0)
Potassium: 4 mmol/L (ref 3.5–5.1)
Sodium: 139 mmol/L (ref 135–145)
TCO2: 21 mmol/L — ABNORMAL LOW (ref 22–32)

## 2022-12-11 LAB — APTT: aPTT: 27 s (ref 24–36)

## 2022-12-11 LAB — ETHANOL: Alcohol, Ethyl (B): <10 mg/dL (ref <10)

## 2022-12-11 MED ORDER — LORAZEPAM 2 MG/ML IJ SOLN
2.0000 mg | Freq: Once | INTRAMUSCULAR | Status: AC | PRN
  Administered 2022-12-11: 2 mg via INTRAVENOUS
  Filled 2022-12-11: qty 1

## 2022-12-11 MED ORDER — ACETAMINOPHEN 160 MG/5ML PO SOLN: 650.0000 mg | ORAL | Status: DC | PRN

## 2022-12-11 MED ORDER — SODIUM CHLORIDE 0.9% FLUSH: 3.0000 mL | Freq: Once | INTRAVENOUS | Status: DC

## 2022-12-11 MED ORDER — STROKE: EARLY STAGES OF RECOVERY BOOK
Freq: Once | Status: AC
  Filled 2022-12-11: qty 1

## 2022-12-11 MED ORDER — ENOXAPARIN SODIUM 40 MG/0.4ML IJ SOSY
40.0000 mg | PREFILLED_SYRINGE | INTRAMUSCULAR | Status: DC
Start: 2022-12-11 — End: 2022-12-13
  Administered 2022-12-12 (×2): 40 mg via SUBCUTANEOUS
  Filled 2022-12-11 (×2): qty 0.4

## 2022-12-11 MED ORDER — SENNOSIDES-DOCUSATE SODIUM 8.6-50 MG PO TABS: 1.0000 | ORAL_TABLET | Freq: Every evening | ORAL | Status: DC | PRN

## 2022-12-11 MED ORDER — LORAZEPAM 2 MG/ML IJ SOLN: 2.0000 mg | Freq: Once | INTRAMUSCULAR | Status: DC

## 2022-12-11 MED ORDER — ACETAMINOPHEN 650 MG RE SUPP: 650.0000 mg | RECTAL | Status: DC | PRN

## 2022-12-11 MED ORDER — ACETAMINOPHEN 325 MG PO TABS: 650.0000 mg | ORAL_TABLET | ORAL | Status: DC | PRN

## 2022-12-11 NOTE — ED Provider Notes (Signed)
[place] EMERGENCY DEPARTMENT AT [facility] Provider Note   CSN: [id number] Arrival date & time: 12/11/22  1321     History  Chief Complaint  Patient presents with   Stroke Symptoms    DAIMIAN SUDBERRY is a 62 y.o. male.  Patient is a 62 year old male with a history of hypertension presenting today from his PCPs office after an event that occurred yesterday concerning for a TIA.  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  He reports for approximately 10 seconds he also felt like he could not get his words out.  This episode did not last long but then he decided to get up and walk down the hall and noticed that his right leg was weak and he was having to drag it.  He states his family were trying to convince him to go to the hospital but he refused.  He reports after about 4 to 5 hours the symptoms finally resolved.  He has been walking around today has not noticed any weakness in his leg.  Denied any speech issues today and has never had any upper extremity involvement.  His son who is present at bedside denied him having any facial droop or speech issues since he has been with him since yesterday afternoon.  Patient states maybe had a little headache yesterday but denies any headache today.  No prior history of similar symptoms.  He denies any drug or alcohol use.  He has recently started on losartan in the last few months for hypertension but otherwise no medication changes.  The history is provided by the patient.       Home Medications Prior to Admission medications   Medication Sig Start Date End Date Taking? Authorizing Provider  hydrocortisone cream 1 % Apply 1 Application topically 2 (two) times daily. 12/04/22  Yes [provider]  losartan (COZAAR) 25 MG tablet Take 25 mg by mouth daily. 12/04/22  Yes [provider]  tiZANidine  (ZANAFLEX) 4 MG tablet Take 4 mg by mouth at bedtime. 12/04/22  Yes [provider]  traMADol (ULTRAM) 50 MG tablet Take 50 mg by mouth 4 (four) times daily. 12/04/22  Yes [provider]  triamcinolone (NASACORT ALLERGY 24HR) 55 MCG/ACT AERO nasal inhaler Place 1 spray into the nose daily. 12/04/22  Yes [provider]  baclofen (LIORESAL) 10 MG tablet Take 1 tablet (10 mg total) by mouth 3 (three) times daily. 10/16/22   Alfredia Ferguson, PA-C  Brimonidine Tartrate (LUMIFY) 0.025 % SOLN Place 1 drop into both eyes daily.    [provider]  loratadine (CLARITIN) 10 MG tablet Take 10 mg by mouth daily.    [provider]  LORazepam (ATIVAN) 1 MG tablet Take 1 mg by mouth at bedtime as needed. 08/08/22   [provider]  mometasone (NASONEX) 50 MCG/ACT nasal spray 2 sprays by Both Nostrils route daily.    [provider]  Multiple Vitamins-Minerals (CENTRUM SILVER PO) Take 1 tablet by mouth daily.    [provider]  traZODone (DESYREL) 50 MG tablet Take 1-2 tablets (50-100 mg total) by mouth at bedtime as needed for sleep. 10/16/22   Alfredia Ferguson, PA-C      Allergies    Cephalosporins, Ciprofloxacin, Iodinated contrast media, Omeprazole, Penicillins, Shrimp [shellfish allergy], Sulfa antibiotics, Sulfonamide derivatives, and Omnipaque [iohexol]    Review of Systems  Review of Systems  Physical Exam Updated Vital Signs BP (!) 166/107 (BP Location: Left Arm)   Pulse 95   Temp 98.8 F (37.1 C)   Resp 16   Ht 6\' 1"  (1.854 m)   Wt (!) 142.4 kg   SpO2 97%   BMI 41.42 kg/m  Physical Exam Vitals and nursing note reviewed.  Constitutional:      General: He is not in acute distress.    Appearance: He is well-developed.  HENT:     Head: Normocephalic and atraumatic.  Eyes:     General: No visual field deficit.    Conjunctiva/sclera: Conjunctivae normal.     Pupils: Pupils are equal, round, and reactive to light.   Cardiovascular:     Rate and Rhythm: Normal rate and regular rhythm.     Heart sounds: No murmur heard. Pulmonary:     Effort: Pulmonary effort is normal. No respiratory distress.     Breath sounds: Normal breath sounds. No wheezing or rales.  Abdominal:     General: There is no distension.     Palpations: Abdomen is soft.     Tenderness: There is no abdominal tenderness. There is no guarding or rebound.  Musculoskeletal:        General: No tenderness. Normal range of motion.     Cervical back: Normal range of motion and neck supple.     Right lower leg: No edema.     Left lower leg: No edema.  Skin:    General: Skin is warm and dry.     Findings: No erythema or rash.  Neurological:     Mental Status: He is alert and oriented to person, place, and time. Mental status is at baseline.     Cranial Nerves: No dysarthria or facial asymmetry.     Sensory: Sensation is intact.     Motor: Motor function is intact. No pronator drift.     Coordination: Coordination is intact. Coordination normal. Finger-Nose-Finger Test and Heel to [facility] Test normal.     Gait: Gait is intact.  Psychiatric:        Mood and Affect: Mood normal.        Behavior: Behavior normal.     ED Results / Procedures / Treatments   Labs (all labs ordered are listed, but only abnormal results are displayed) Labs Reviewed  I-STAT CHEM 8, ED - Abnormal; Notable for the following components:      Result Value   Calcium, Ion 1.04 (*)    TCO2 21 (*)    All other components within normal limits  PROTIME-INR  APTT  CBC  DIFFERENTIAL  COMPREHENSIVE METABOLIC PANEL  ETHANOL  HIV ANTIBODY (ROUTINE TESTING W REFLEX)  LIPID PANEL  CBC  HEMOGLOBIN A1C  BASIC METABOLIC PANEL    EKG EKG Interpretation Date/Time:  Tuesday December 11 2022 13:45:16 EST Ventricular Rate:  94 PR Interval:  152 QRS Duration:  102 QT Interval:  364 QTC Calculation: 455 R Axis:   -24  Text Interpretation: Normal sinus rhythm Minimal  voltage criteria for LVH, may be normal variant ( R in aVL ) No significant change since last tracing When compared with ECG of 13-Jul-2020 12:54, PREVIOUS ECG IS PRESENT Confirmed by Gwyneth Sprout ([id number]) on 12/11/2022 6:49:56 PM  Radiology CT HEAD WO CONTRAST  Result Date: 12/11/2022 CLINICAL DATA:  Neuro deficit, acute, stroke suspected EXAM: CT HEAD WITHOUT CONTRAST TECHNIQUE: Contiguous axial images were obtained from the base of the skull through the vertex  without intravenous contrast. RADIATION DOSE REDUCTION: This exam was performed according to the departmental dose-optimization program which includes automated exposure control, adjustment of the mA and/or kV according to patient size and/or use of iterative reconstruction technique. COMPARISON:  CT Head 02/06/05 FINDINGS: Brain: No hemorrhage. No hydrocephalus. No extra-axial fluid collection. No CT evidence of an acute cortical infarct. No mass effect. No mass lesion. Mega cisterna magna. Enlarged and partially empty sella. Vascular: No hyperdense vessel or unexpected calcification. Skull: Normal. Negative for fracture or focal lesion. Sinuses/Orbits: No middle ear or mastoid effusion. Paranasal sinuses are clear. Orbits are unremarkable. Other: None. IMPRESSION: No hemorrhage or CT evidence of an acute cortical infarct. Electronically Signed   By: Lorenza Cambridge M.D.   On: 12/11/2022 16:25    Procedures Procedures    Medications Ordered in ED Medications  sodium chloride flush (NS) 0.9 % injection 3 mL (3 mLs Intravenous Not Given 12/11/22 1907)  LORazepam (ATIVAN) injection 2 mg (has no administration in time range)   stroke: early stages of recovery book (has no administration in time range)  acetaminophen (TYLENOL) tablet 650 mg (has no administration in time range)    Or  acetaminophen (TYLENOL) 160 MG/5ML solution 650 mg (has no administration in time range)    Or  acetaminophen (TYLENOL) suppository 650 mg (has no administration  in time range)  senna-docusate (Senokot-S) tablet 1 tablet (has no administration in time range)  enoxaparin (LOVENOX) injection 40 mg (has no administration in time range)    ED Course/ Medical Decision Making/ A&P                                 Medical Decision Making Amount and/or Complexity of Data Reviewed External Data Reviewed: notes. Labs: ordered. Decision-making details documented in ED Course. Radiology: ordered and independent interpretation performed. Decision-making details documented in ED Course. ECG/medicine tests: ordered and independent interpretation performed. Decision-making details documented in ED Course.  Risk Decision regarding hospitalization.   Pt with multiple medical problems and comorbidities and presenting today with a complaint that caries a high risk for morbidity and mortality.  Here today with symptoms that occurred yesterday concerning for TIA.  Patient is currently symptom-free and no indication to call a code stroke.  He takes no anticoagulation but is noted to be hypertensive here.  No acute findings on his exam.  Low suspicion for vertebral dissection, intracranial bleed or tumor.  Concern for TIA and patient needs a workup.  I independently interpreted patient's EKG and labs.  CBC, CMP, alcohol all within normal limits EKG without acute findings.  I have independently visualized and interpreted pt's images today.  Head CT today without acute findings.  Radiology reports that it is negative.  Consulted neurology Dr. Randal Buba who recommended ABC score being 6 that patient needs admission for TIA workup.  Patient cannot have a CTA of his head due to contrast dye allergy.  Will need an MR MRA.  Patient does have PTSD and severe anxiety about MRIs.  Will medicate with Ativan however if patient cannot tolerate it he may need further sedation tomorrow.  Patient admitted to the hospitalist service.  Findings discussed with the patient and his son and they are  comfortable with this plan.          Final Clinical Impression(s) / ED Diagnoses Final diagnoses:  TIA (transient ischemic attack)    Rx / DC Orders ED Discharge Orders  None         Gwyneth Sprout, MD 12/11/22 2120

## 2022-12-11 NOTE — ED Notes (Signed)
Patient transported to MRI 

## 2022-12-11 NOTE — ED Notes (Signed)
Provider at bedside

## 2022-12-11 NOTE — ED Triage Notes (Addendum)
Pt sent by PCP fro possible TIA. PT states was speaking with a customer at 150 and started to sway side to side in chair and couldn't speak for 10 sec. Pt states started to walk down the hall and his right leg became very weak like it was dragging. Pt states had dull HA yesterday. Pt states felt better this morning.

## 2022-12-11 NOTE — H&P (Signed)
History and Physical    Russell Thomas QIO:[id number] DOB: [birth year] DOA: 12/11/2022  PCP: Alysia Penna, MD   Patient coming from: Home   Chief Complaint: Transient aphasia and RLE weakness   HPI: Russell Thomas is a 62 y.o. male with medical history significant for hypertension, hyperlipidemia, OSA on CPAP, anxiety, insomnia, and BMI 41 who presents for evaluation of transient aphasia and right leg weakness.   Patient was in his usual state of health yesterday afternoon when he was seated at a desk and conversing with someone when he had acute onset of off-balance sensation.  He describes swaying back-and-forth and holding onto his desk to keep from falling.  He was also unable to get words out for roughly 10 seconds.  Following this, he had some difficulty ambulating due to right leg weakness.  Family noted him to be dragging the right leg.  There was no associated numbness, speech difficulty and weakness has since resolved and he feels back to normal by the time of admission.  ED Course: Upon arrival to the ED, patient is found to be afebrile and saturating well on room air with normal heart rate and stable blood pressure.  EKG demonstrates sinus rhythm and head CT is negative for acute intracranial abnormality.  CMP and CBC are unremarkable.  Neurology was consulted by the ED physician and hospitalists were asked to admit.  Review of Systems:  All other systems reviewed and apart from HPI, are negative.  Past Medical History:  Diagnosis Date   Adenomatous colon polyp 2008   Dr Arlyce Dice, GI   Allergic rhinitis    Anxiety    Diverticulosis    Gallbladder attack 2015   stones present   Gastritis    Hyperlipemia 2009   IBS (irritable bowel syndrome)    Internal hemorrhoids 2012   banded    Lichen planus    Pre-diabetes    Prostate cancer (HCC)    Seasonal allergies    Sleep apnea    [facility]    Past Surgical History:  Procedure Laterality Date    BIOPSY PROSTATE  12/03/2016   COLONOSCOPY  04/10/2010, 08/12/2006   3/12 with banding; 2008: adenomatous polyp, diverticulosis   INCISIONAL HERNIA REPAIR N/A 07/18/2020   Procedure: LAPAROSCOPIC INCISIONAL HERNIA REPAIR WITH MESH;  Surgeon: Abigail Miyamoto, MD;  Location: [facility] ORS;  Service: General;  Laterality: N/A;   LYMPHADENECTOMY Bilateral 02/14/2017   Procedure: LYMPHADENECTOMY/PELVIC;  Surgeon: Heloise Purpura, MD;  Location: [facility] ORS;  Service: Urology;  Laterality: Bilateral;   ROBOT ASSISTED LAPAROSCOPIC RADICAL PROSTATECTOMY N/A 02/14/2017   Procedure: XI ROBOTIC ASSISTED LAPAROSCOPIC RADICAL PROSTATECTOMY LEVEL 3;  Surgeon: Heloise Purpura, MD;  Location: [facility] ORS;  Service: Urology;  Laterality: N/A;   SEPTOPLASTY     thumb surgery     TONSILLECTOMY     UPPER GASTROINTESTINAL ENDOSCOPY  10/24/2001   gastritis   UPPER GASTROINTESTINAL ENDOSCOPY  11/03/12    Social History:   reports that he has quit smoking. His smoking use included cigars. He has never used smokeless tobacco. He reports current alcohol use. He reports that he does not use drugs.  Allergies  Allergen Reactions   Cephalosporins Hives and Rash   Ciprofloxacin Rash   Iodinated Contrast Media Hives, Itching and Rash    GADOLINIUM   Omeprazole Rash   Penicillins Itching, Rash and Other (See Comments)    Weezing, blisters Has patient had a PCN reaction causing immediate rash, facial/tongue/throat swelling, SOB or lightheadedness with  hypotension: Yes Has patient had a PCN reaction causing severe rash involving mucus membranes or skin necrosis: No Has patient had a PCN reaction that required hospitalization: No Has patient had a PCN reaction occurring within the last 10 years: No If all of the above answers are "NO", then may proceed with Cephalosporin use.    Shrimp [Shellfish Allergy] Rash   Sulfa Antibiotics Hives and Rash   Sulfonamide Derivatives Rash   Omnipaque [Iohexol] Hives and Itching    Scratchy palms,  scratchy throat, face and chest redness, hives all over. (CT fluoroscopic Contrast)      Family History  Problem Relation Age of Onset   Thyroid disease Mother        hypothyroidism   Allergies Mother    Parkinson's disease Mother        not definite   Atrial fibrillation Mother    Colitis Father    Alcohol abuse Paternal Grandfather      Prior to Admission medications   Medication Sig Start Date End Date Taking? Authorizing Provider  baclofen (LIORESAL) 10 MG tablet Take 1 tablet (10 mg total) by mouth 3 (three) times daily. 10/16/22   Alfredia Ferguson, PA-C  Brimonidine Tartrate (LUMIFY) 0.025 % SOLN Place 1 drop into both eyes daily.    [provider]  loratadine (CLARITIN) 10 MG tablet Take 10 mg by mouth daily.    [provider]  LORazepam (ATIVAN) 1 MG tablet Take 1 mg by mouth at bedtime as needed. 08/08/22   [provider]  mometasone (NASONEX) 50 MCG/ACT nasal spray 2 sprays by Both Nostrils route daily.    [provider]  Multiple Vitamins-Minerals (CENTRUM SILVER PO) Take 1 tablet by mouth daily.    [provider]  traZODone (DESYREL) 50 MG tablet Take 1-2 tablets (50-100 mg total) by mouth at bedtime as needed for sleep. 10/16/22   Alfredia Ferguson, PA-C    Physical Exam: Vitals:   12/11/22 1335 12/11/22 1357 12/11/22 1724  BP: (!) 139/97  (!) 166/107  Pulse: 86  95  Resp: 18  16  Temp: 98.3 F (36.8 C)  98.8 F (37.1 C)  SpO2: 97%  97%  Weight:  (!) 142.4 kg   Height:  6\' 1"  (1.854 m)     Constitutional: NAD, no pallor or diaphoresis   Eyes: PERTLA, lids and conjunctivae normal ENMT: Mucous membranes are moist. Posterior pharynx clear of any exudate or lesions.   Neck: supple, no masses  Respiratory: no wheezing, no crackles. No accessory muscle use.  Cardiovascular: S1 & S2 heard, regular rate and rhythm. No extremity edema.   Abdomen: No distension, no tenderness, soft. Bowel sounds active.  Musculoskeletal: no  clubbing / cyanosis. No joint deformity upper and lower extremities.   Skin: no significant rashes, lesions, ulcers. Warm, dry, well-perfused. Neurologic: CN 2-12 grossly intact. Sensation intact. Strength 5/5 in all 4 limbs. Alert and oriented.  Psychiatric: Calm. Cooperative.    Labs and Imaging on Admission: I have personally reviewed following labs and imaging studies  CBC: Recent Labs  Lab 12/11/22 1403 12/11/22 1412  WBC 9.7  --   NEUTROABS 6.6  --   HGB 15.9 16.0  HCT 46.4 47.0  MCV 89.7  --   PLT 222  --    Basic Metabolic Panel: Recent Labs  Lab 12/11/22 1403 12/11/22 1412  NA 140 139  K 4.1 4.0  CL 107 106  CO2 22  --   GLUCOSE 92 88  BUN 13 16  CREATININE 0.99 1.00  CALCIUM 9.0  --    GFR: Estimated Creatinine Clearance: 110.7 mL/min (by C-G formula based on SCr of 1 mg/dL). Liver Function Tests: Recent Labs  Lab 12/11/22 1403  AST 21  ALT 31  ALKPHOS 60  BILITOT 0.7  PROT 6.9  ALBUMIN 4.1   No results for input(s): "LIPASE", "AMYLASE" in the last 168 hours. No results for input(s): "AMMONIA" in the last 168 hours. Coagulation Profile: Recent Labs  Lab 12/11/22 1403  INR 1.0   Cardiac Enzymes: No results for input(s): "CKTOTAL", "CKMB", "CKMBINDEX", "TROPONINI" in the last 168 hours. BNP (last 3 results) No results for input(s): "PROBNP" in the last 8760 hours. HbA1C: No results for input(s): "HGBA1C" in the last 72 hours. CBG: No results for input(s): "GLUCAP" in the last 168 hours. Lipid Profile: No results for input(s): "CHOL", "HDL", "LDLCALC", "TRIG", "CHOLHDL", "LDLDIRECT" in the last 72 hours. Thyroid Function Tests: No results for input(s): "TSH", "T4TOTAL", "FREET4", "T3FREE", "THYROIDAB" in the last 72 hours. Anemia Panel: No results for input(s): "VITAMINB12", "FOLATE", "FERRITIN", "TIBC", "IRON", "RETICCTPCT" in the last 72 hours. Urine analysis:    Component Value Date/Time   COLORURINE YELLOW 08/31/2015 1538    APPEARANCEUR CLEAR 08/31/2015 1538   LABSPEC 1.024 08/31/2015 1538   PHURINE 5.5 08/31/2015 1538   GLUCOSEU NEGATIVE 08/31/2015 1538   GLUCOSEU NEGATIVE 09/11/2013 1351   HGBUR NEGATIVE 08/31/2015 1538   HGBUR negative 12/01/2009 1600   BILIRUBINUR NEGATIVE 08/31/2015 1538   BILIRUBINUR Neg 11/14/2012 1125   KETONESUR NEGATIVE 08/31/2015 1538   PROTEINUR NEGATIVE 08/31/2015 1538   UROBILINOGEN 0.2 09/11/2013 1351   NITRITE NEGATIVE 08/31/2015 1538   LEUKOCYTESUR NEGATIVE 08/31/2015 1538   Sepsis Labs: @LABRCNTIP (procalcitonin:4,lacticidven:4) )No results found for this or any previous visit (from the past 240 hour(s)).   Radiological Exams on Admission: CT HEAD WO CONTRAST  Result Date: 12/11/2022 CLINICAL DATA:  Neuro deficit, acute, stroke suspected EXAM: CT HEAD WITHOUT CONTRAST TECHNIQUE: Contiguous axial images were obtained from the base of the skull through the vertex without intravenous contrast. RADIATION DOSE REDUCTION: This exam was performed according to the departmental dose-optimization program which includes automated exposure control, adjustment of the mA and/or kV according to patient size and/or use of iterative reconstruction technique. COMPARISON:  CT Head 02/06/05 FINDINGS: Brain: No hemorrhage. No hydrocephalus. No extra-axial fluid collection. No CT evidence of an acute cortical infarct. No mass effect. No mass lesion. Mega cisterna magna. Enlarged and partially empty sella. Vascular: No hyperdense vessel or unexpected calcification. Skull: Normal. Negative for fracture or focal lesion. Sinuses/Orbits: No middle ear or mastoid effusion. Paranasal sinuses are clear. Orbits are unremarkable. Other: None. IMPRESSION: No hemorrhage or CT evidence of an acute cortical infarct. Electronically Signed   By: Lorenza Cambridge M.D.   On: 12/11/2022 16:25    EKG: Independently reviewed. Sinus rhythm.   Assessment/Plan   1. Acute ischemic CVA   - Presents after an episode of aphasia  and RLE weakness on 12/10/22 and is found to have 2 small acute ischemic left posterior frontal lobe infarcts  - Continue cardiac monitoring and frequent neuro checks, keep NPO pending swallow screen, check carotid imaging, A1c, lipids, and echocardiogram, consult PT/OT/SLP, start ASA 81 mg daily, start Plavix 75 mg daily x21 days    2.  Anxiety; insomnia  - Continue as-needed Ativan    3. OSA  - Continue CPAP while sleeping    DVT prophylaxis: Lovenox  Code Status: Full  Level of Care: Level of care: Telemetry Medical Family Communication: Son at bedside   Disposition Plan:  Patient is from: home  Anticipated d/c is to: Home  Anticipated d/c date is: 11/6 or 12/13/22  Patient currently: Pending TIA workup  Consults called: Neurology  Admission status: Observation     Briscoe Deutscher, MD [facility]  12/11/2022, 8:51 PM

## 2022-12-11 NOTE — ED Provider Triage Note (Signed)
Emergency Medicine Provider Triage Evaluation Note  Russell Thomas , a 62 y.o. male  was evaluated in triage.  Pt complains of episode of swaying back-and-forth and 1 minute episode of aphasia that he experienced at 1500 yesterday.  He reports following this he tried to get up and walk and he experienced right foot "dragging".  Symptoms resolved but he had 1 more episode of right foot "dragging" at 1900 last evening.  He currently has no complaints of headache, aphasia, weakness, paresthesia  Review of Systems  Positive: Aphasia, right foot dragging, HA Negative: Fevers, cough, sob  Physical Exam  BP (!) 139/97 (BP Location: Right Arm)   Pulse 86   Temp 98.3 F (36.8 C)   Resp 18   Ht 6\' 1"  (1.854 m)   Wt (!) 142.4 kg   SpO2 97%   BMI 41.42 kg/m  Gen:   Awake, no distress   Resp:  Normal effort  MSK:   Moves extremities without difficulty  Other:  Neurologically intact  Medical Decision Making  Medically screening exam initiated at 2:52 PM.  Appropriate orders placed.  Russell Thomas was informed that the remainder of the evaluation will be completed by another provider, this initial triage assessment does not replace that evaluation, and the importance of remaining in the ED until their evaluation is complete.  Labs and imaging ordered   Judithann Sheen, [place] 12/11/22 1454

## 2022-12-11 NOTE — ED Notes (Signed)
ED TO INPATIENT HANDOFF REPORT  ED Nurse Name and Phone #: Juliette Alcide [phone]  S Name/Age/Gender [name] 62 y.o. male Room/Bed: 038C/038C  Code Status   Code Status: Full Code  Home/SNF/Other Home Patient oriented to: self, place, time, and situation Is this baseline? Yes   Triage Complete: Triage complete  Chief Complaint Episode of transient neurologic symptoms [R29.90]  Triage Note Pt sent by PCP fro possible TIA. PT states was speaking with a customer at 150 and started to sway side to side in chair and couldn't speak for 10 sec. Pt states started to walk down the hall and his right leg became very weak like it was dragging. Pt states had dull HA yesterday. Pt states felt better this morning.   Allergies Allergies  Allergen Reactions   Cephalosporins Hives and Rash   Ciprofloxacin Rash   Iodinated Contrast Media Hives, Itching and Rash    GADOLINIUM   Omeprazole Rash   Penicillins Itching, Rash and Other (See Comments)    Weezing, blisters Has patient had a PCN reaction causing immediate rash, facial/tongue/throat swelling, SOB or lightheadedness with hypotension: Yes Has patient had a PCN reaction causing severe rash involving mucus membranes or skin necrosis: No Has patient had a PCN reaction that required hospitalization: No Has patient had a PCN reaction occurring within the last 10 years: No If all of the above answers are "NO", then may proceed with Cephalosporin use.    Shrimp [Shellfish Allergy] Rash   Sulfa Antibiotics Hives and Rash   Sulfonamide Derivatives Rash   Omnipaque [Iohexol] Hives and Itching    Scratchy palms, scratchy throat, face and chest redness, hives all over. (CT fluoroscopic Contrast)      Level of Care/Admitting Diagnosis ED Disposition     ED Disposition  Admit   Condition  --   Comment  Hospital Area: [facility] [[id number]]  Level of Care: Telemetry Medical [104]  May place patient in observation at  [facility] or [facility] if equivalent level of care is available:: No  Covid Evaluation: Asymptomatic - no recent exposure (last 10 days) testing not required  Diagnosis: Episode of transient neurologic symptoms [454098]  Admitting Physician: Briscoe Deutscher [[id number]]  Attending Physician: Briscoe Deutscher [[id number]]          B Medical/Surgery History Past Medical History:  Diagnosis Date   Adenomatous colon polyp 2008   Dr Arlyce Dice, GI   Allergic rhinitis    Anxiety    Diverticulosis    Gallbladder attack 2015   stones present   Gastritis    Hyperlipemia 2009   IBS (irritable bowel syndrome)    Internal hemorrhoids 2012   banded    Lichen planus    Pre-diabetes    Prostate cancer (HCC)    Seasonal allergies    Sleep apnea    [facility]   Past Surgical History:  Procedure Laterality Date   BIOPSY PROSTATE  12/03/2016   COLONOSCOPY  04/10/2010, 08/12/2006   3/12 with banding; 2008: adenomatous polyp, diverticulosis   INCISIONAL HERNIA REPAIR N/A 07/18/2020   Procedure: LAPAROSCOPIC INCISIONAL HERNIA REPAIR WITH MESH;  Surgeon: Abigail Miyamoto, MD;  Location: [facility];  Service: General;  Laterality: N/A;   LYMPHADENECTOMY Bilateral 02/14/2017   Procedure: LYMPHADENECTOMY/PELVIC;  Surgeon: Heloise Purpura, MD;  Location: [facility];  Service: Urology;  Laterality: Bilateral;   ROBOT ASSISTED LAPAROSCOPIC RADICAL PROSTATECTOMY N/A 02/14/2017   Procedure: XI ROBOTIC ASSISTED LAPAROSCOPIC RADICAL PROSTATECTOMY LEVEL 3;  Surgeon: Heloise Purpura, MD;  Location: [facility];  Service: Urology;  Laterality: N/A;   SEPTOPLASTY     thumb surgery     TONSILLECTOMY     UPPER GASTROINTESTINAL ENDOSCOPY  10/24/2001   gastritis   UPPER GASTROINTESTINAL ENDOSCOPY  11/03/12     A IV Location/Drains/Wounds Patient Lines/Drains/Airways Status     Active Line/Drains/Airways     Name Placement date Placement time Site Days   Peripheral IV 07/18/20 18 G 1.25" Left Hand 07/18/20   0550  Hand  876   Peripheral IV 12/11/22 20 G Anterior;Distal;Right;Upper Arm 12/11/22  1959  Arm  less than 1   Incision - 3 Ports Abdomen Left;Upper Left;Mid Left;Lower 07/18/20  0755  -- 876            Intake/Output Last 24 hours No intake or output data in the 24 hours ending 12/11/22 2245  Labs/Imaging Results for orders placed or performed during the hospital encounter of 12/11/22 (from the past 48 hour(s))  Protime-INR     Status: None   Collection Time: 12/11/22  2:03 PM  Result Value Ref Range   Prothrombin Time 13.1 11.4 - 15.2 seconds   INR 1.0 0.8 - 1.2    Comment: (NOTE) INR goal varies based on device and disease states. Performed at [facility], [address], [place], [place] [address]   APTT     Status: None   Collection Time: 12/11/22  2:03 PM  Result Value Ref Range   aPTT 27 24 - 36 seconds    Comment: Performed at [facility], [address] [place], [place] [address]  CBC     Status: None   Collection Time: 12/11/22  2:03 PM  Result Value Ref Range   WBC 9.7 4.0 - 10.5 K/uL   RBC 5.17 4.22 - 5.81 MIL/uL   Hemoglobin 15.9 13.0 - 17.0 g/dL   HCT 10.2 72.5 - 36.6 %   MCV 89.7 80.0 - 100.0 fL   MCH 30.8 26.0 - 34.0 pg   MCHC 34.3 30.0 - 36.0 g/dL   RDW 44.0 34.7 - 42.5 %   Platelets 222 150 - 400 K/uL   nRBC 0.0 0.0 - 0.2 %    Comment: Performed at [facility], [address] [place], [place] [address]  Differential     Status: None   Collection Time: 12/11/22  2:03 PM  Result Value Ref Range   Neutrophils Relative % 69 %   Neutro Abs 6.6 1.7 - 7.7 K/uL   Lymphocytes Relative 24 %   Lymphs Abs 2.4 0.7 - 4.0 K/uL   Monocytes Relative 7 %   Monocytes Absolute 0.7 0.1 - 1.0 K/uL   Eosinophils Relative 0 %   Eosinophils Absolute 0.0 0.0 - 0.5 K/uL   Basophils Relative 0 %   Basophils Absolute 0.0 0.0 - 0.1 K/uL   Immature Granulocytes 0 %   Abs Immature Granulocytes 0.03 0.00 - 0.07 K/uL    Comment: Performed at [facility], [address], [place], [place] [address]  Comprehensive metabolic panel     Status: None   Collection Time: 12/11/22  2:03 PM  Result Value Ref Range   Sodium 140 135 - 145 mmol/L   Potassium 4.1 3.5 - 5.1 mmol/L   Chloride 107 98 - 111 mmol/L   CO2 22 22 - 32 mmol/L   Glucose, Bld 92 70 - 99 mg/dL    Comment:  Glucose reference range applies only to samples taken after fasting for at least 8 hours.   BUN 13 8 - 23 mg/dL   Creatinine, Ser 1.61 0.61 - 1.24 mg/dL   Calcium 9.0 8.9 - 09.6 mg/dL   Total Protein 6.9 6.5 - 8.1 g/dL   Albumin 4.1 3.5 - 5.0 g/dL   AST 21 15 - 41 U/L   ALT 31 0 - 44 U/L   Alkaline Phosphatase 60 38 - 126 U/L   Total Bilirubin 0.7 <1.2 mg/dL   GFR, Estimated >04 >54 mL/min    Comment: (NOTE) Calculated using the CKD-EPI Creatinine Equation (2021)    Anion gap 11 5 - 15    Comment: Performed at [facility], [address] [place], [place] [address]  Ethanol     Status: None   Collection Time: 12/11/22  2:03 PM  Result Value Ref Range   Alcohol, Ethyl (B) <10 <10 mg/dL    Comment: (NOTE) Lowest detectable limit for serum alcohol is 10 mg/dL.  For medical purposes only. Performed at [facility], [address] [place], [place] [address]   I-stat chem 8, ED     Status: Abnormal   Collection Time: 12/11/22  2:12 PM  Result Value Ref Range   Sodium 139 135 - 145 mmol/L   Potassium 4.0 3.5 - 5.1 mmol/L   Chloride 106 98 - 111 mmol/L   BUN 16 8 - 23 mg/dL   Creatinine, Ser 2.95 0.61 - 1.24 mg/dL   Glucose, Bld 88 70 - 99 mg/dL    Comment: Glucose reference range applies only to samples taken after fasting for at least 8 hours.   Calcium, Ion 1.04 (L) 1.15 - 1.40 mmol/L   TCO2 21 (L) 22 - 32 mmol/L   Hemoglobin 16.0 13.0 - 17.0 g/dL   HCT 62.1 30.8 - 65.7 %   CT HEAD WO CONTRAST  Result Date: 12/11/2022 CLINICAL DATA:  Neuro deficit, acute, stroke suspected EXAM: CT HEAD WITHOUT CONTRAST TECHNIQUE: Contiguous axial  images were obtained from the base of the skull through the vertex without intravenous contrast. RADIATION DOSE REDUCTION: This exam was performed according to the departmental dose-optimization program which includes automated exposure control, adjustment of the mA and/or kV according to patient size and/or use of iterative reconstruction technique. COMPARISON:  CT Head 02/06/05 FINDINGS: Brain: No hemorrhage. No hydrocephalus. No extra-axial fluid collection. No CT evidence of an acute cortical infarct. No mass effect. No mass lesion. Mega cisterna magna. Enlarged and partially empty sella. Vascular: No hyperdense vessel or unexpected calcification. Skull: Normal. Negative for fracture or focal lesion. Sinuses/Orbits: No middle ear or mastoid effusion. Paranasal sinuses are clear. Orbits are unremarkable. Other: None. IMPRESSION: No hemorrhage or CT evidence of an acute cortical infarct. Electronically Signed   By: Lorenza Cambridge M.D.   On: 12/11/2022 16:25    Pending Labs Unresulted Labs (From admission, onward)     Start     Ordered   12/18/22 0500  Creatinine, serum  (enoxaparin (LOVENOX)    CrCl >/= 30 ml/min)  Weekly,   R     Comments: while on enoxaparin therapy    12/11/22 2051   12/12/22 0500  HIV Antibody (routine testing w rflx)  (HIV Antibody (Routine testing w reflex) panel)  Tomorrow morning,   R        12/11/22 2051   12/12/22 0500  Lipid panel  (Labs)  Tomorrow morning,   R  Comments: Fasting    12/11/22 2051   12/12/22 0500  CBC  (Labs)  Daily,   R      12/11/22 2051   12/12/22 0500  Hemoglobin A1c  (Labs)  Tomorrow morning,   R       Comments: To assess prior glycemic control    12/11/22 2051   12/12/22 0500  Basic metabolic panel  Daily,   R      12/11/22 2051            Vitals/Pain Today's Vitals   12/11/22 1335 12/11/22 1356 12/11/22 1357 12/11/22 1724  BP: (!) 139/97   (!) 166/107  Pulse: 86   95  Resp: 18   16  Temp: 98.3 F (36.8 C)   98.8 F (37.1 C)   SpO2: 97%   97%  Weight:   (!) 142.4 kg   Height:   6\' 1"  (1.854 m)   PainSc:  2       Isolation Precautions No active isolations  Medications Medications  sodium chloride flush (NS) 0.9 % injection 3 mL (3 mLs Intravenous Not Given 12/11/22 1907)   stroke: early stages of recovery book (has no administration in time range)  acetaminophen (TYLENOL) tablet 650 mg (has no administration in time range)    Or  acetaminophen (TYLENOL) 160 MG/5ML solution 650 mg (has no administration in time range)    Or  acetaminophen (TYLENOL) suppository 650 mg (has no administration in time range)  senna-docusate (Senokot-S) tablet 1 tablet (has no administration in time range)  enoxaparin (LOVENOX) injection 40 mg (has no administration in time range)  LORazepam (ATIVAN) injection 2 mg (2 mg Intravenous Given 12/11/22 2141)    Mobility walks     Focused Assessments Neuro Assessment Handoff:  Swallow screen pass? Yes          Neuro Assessment:   Neuro Checks:      Has TPA been given? No If patient is a Neuro Trauma and patient is going to OR before floor call report to 4N Charge nurse: [phone] or [phone]   R Recommendations: See Admitting Provider Note  Report given to:   Additional Notes: .

## 2022-12-12 ENCOUNTER — Observation Stay (HOSPITAL_BASED_OUTPATIENT_CLINIC_OR_DEPARTMENT_OTHER): Payer: PRIVATE HEALTH INSURANCE

## 2022-12-12 DIAGNOSIS — G4733 Obstructive sleep apnea (adult) (pediatric): Secondary | ICD-10-CM | POA: Diagnosis not present

## 2022-12-12 DIAGNOSIS — I6389 Other cerebral infarction: Secondary | ICD-10-CM | POA: Diagnosis not present

## 2022-12-12 DIAGNOSIS — I639 Cerebral infarction, unspecified: Secondary | ICD-10-CM

## 2022-12-12 DIAGNOSIS — I63422 Cerebral infarction due to embolism of left anterior cerebral artery: Secondary | ICD-10-CM | POA: Diagnosis not present

## 2022-12-12 LAB — ECHOCARDIOGRAM COMPLETE
AR max vel: 3.06 cm2
AV Area VTI: 3.24 cm2
AV Area mean vel: 2.93 cm2
AV Mean grad: 5 mm[Hg]
AV Peak grad: 8.8 mm[Hg]
Ao pk vel: 1.48 m/s
Area-P 1/2: 4.83 cm2
Height: 73 in
S' Lateral: 3.4 cm
Weight: 5022.96 [oz_av]

## 2022-12-12 LAB — CBC
HCT: 44.5 % (ref 39.0–52.0)
Hemoglobin: 15 g/dL (ref 13.0–17.0)
MCH: 30.9 pg (ref 26.0–34.0)
MCHC: 33.7 g/dL (ref 30.0–36.0)
MCV: 91.6 fL (ref 80.0–100.0)
Platelets: 209 10*3/uL (ref 150–400)
RBC: 4.86 MIL/uL (ref 4.22–5.81)
RDW: 13 % (ref 11.5–15.5)
WBC: 9.4 10*3/uL (ref 4.0–10.5)
nRBC: 0 % (ref 0.0–0.2)

## 2022-12-12 LAB — LIPID PANEL
Cholesterol: 174 mg/dL (ref 0–200)
HDL: 44 mg/dL (ref >40)
LDL Cholesterol: 100 mg/dL — ABNORMAL HIGH (ref 0–99)
Total CHOL/HDL Ratio: 4 {ratio}
Triglycerides: 152 mg/dL — ABNORMAL HIGH (ref <150)
VLDL: 30 mg/dL (ref 0–40)

## 2022-12-12 LAB — BASIC METABOLIC PANEL
Anion gap: 11 (ref 5–15)
BUN: 14 mg/dL (ref 8–23)
CO2: 22 mmol/L (ref 22–32)
Calcium: 8.9 mg/dL (ref 8.9–10.3)
Chloride: 104 mmol/L (ref 98–111)
Creatinine, Ser: 0.94 mg/dL (ref 0.61–1.24)
GFR, Estimated: >60 mL/min (ref >60)
Glucose, Bld: 129 mg/dL — ABNORMAL HIGH (ref 70–99)
Potassium: 4.1 mmol/L (ref 3.5–5.1)
Sodium: 137 mmol/L (ref 135–145)

## 2022-12-12 LAB — HIV ANTIBODY (ROUTINE TESTING W REFLEX): HIV Screen 4th Generation wRfx: NONREACTIVE

## 2022-12-12 LAB — HEMOGLOBIN A1C
Hgb A1c MFr Bld: 5.8 % — ABNORMAL HIGH (ref 4.8–5.6)
Mean Plasma Glucose: 119.76 mg/dL

## 2022-12-12 MED ORDER — CLOPIDOGREL BISULFATE 75 MG PO TABS
75.0000 mg | ORAL_TABLET | Freq: Every day | ORAL | Status: DC
  Administered 2022-12-12 – 2022-12-13 (×2): 75 mg via ORAL
  Filled 2022-12-12 (×2): qty 1

## 2022-12-12 MED ORDER — TIZANIDINE HCL 4 MG PO TABS: 4.0000 mg | ORAL_TABLET | Freq: Every evening | ORAL | Status: DC | PRN

## 2022-12-12 MED ORDER — ASPIRIN 81 MG PO TBEC
81.0000 mg | DELAYED_RELEASE_TABLET | Freq: Every day | ORAL | Status: DC
  Administered 2022-12-12 – 2022-12-13 (×2): 81 mg via ORAL
  Filled 2022-12-12 (×2): qty 1

## 2022-12-12 MED ORDER — LOSARTAN POTASSIUM 50 MG PO TABS: 25.0000 mg | ORAL_TABLET | Freq: Every day | ORAL | Status: DC

## 2022-12-12 MED ORDER — LORAZEPAM 1 MG PO TABS: 1.0000 mg | ORAL_TABLET | Freq: Every evening | ORAL | Status: DC | PRN

## 2022-12-12 MED ORDER — BRIMONIDINE TARTRATE 0.025 % OP SOLN: 1.0000 [drp] | Freq: Every day | OPHTHALMIC | Status: DC

## 2022-12-12 MED ORDER — ROSUVASTATIN CALCIUM 20 MG PO TABS
20.0000 mg | ORAL_TABLET | Freq: Every day | ORAL | Status: DC
  Administered 2022-12-12 – 2022-12-13 (×2): 20 mg via ORAL
  Filled 2022-12-12 (×2): qty 1

## 2022-12-12 NOTE — ED Notes (Signed)
Called the floor and advised patient will be coming up at the 40 min mark and to please review the sbar.

## 2022-12-12 NOTE — Plan of Care (Signed)

## 2022-12-12 NOTE — Progress Notes (Signed)
Patient received to room from ED

## 2022-12-12 NOTE — Progress Notes (Addendum)
STROKE TEAM PROGRESS NOTE   BRIEF HPI Mr. Russell Thomas is a 62 y.o. male with history of Prediabetes, OSA, HLD who presents with an episode of aphasia lasting 10 secs, followed by several hours of mild R leg weakness and feeling like he is dragging his R leg.    SIGNIFICANT HOSPITAL EVENTS  11/5 MRI brain Two small acute infarcts within the posterior left frontal lobe   INTERIM HISTORY/SUBJECTIVE Patient states he began having trouble on Monday ~330p when he began to feel lightheaded and  had trouble with his speech lasting about 10 seconds, 30 seconds to 1 minute of right leg weakness and dragging his leg and he went to go lay down for a while.  The whole episode lasted for about 3-4 hours  MRI brain revealed Two small acute infarcts within the posterior left frontal lobe  Recommend 81 milligram aspirin and 75 mg Plavix for 3 weeks then aspirin alone. Recommend a TEE and cardiology is aware Also recommend Crestor 20 mg  Patient was given information on [facility] Stroke study and that he was discussed with Dr.Lessie Manigo.  Will read over materials and let us know his decision on whether or not he is interested in enrolling  OBJECTIVE  CBC    Component Value Date/Time   WBC 9.4 12/12/2022 0156   RBC 4.86 12/12/2022 0156   HGB 15.0 12/12/2022 0156   HGB 15.4 10/18/2015 0934   HCT 44.5 12/12/2022 0156   HCT 44.2 10/18/2015 0934   PLT 209 12/12/2022 0156   PLT 195 10/18/2015 0934   MCV 91.6 12/12/2022 0156   MCV 90.2 10/18/2015 0934   MCH 30.9 12/12/2022 0156   MCHC 33.7 12/12/2022 0156   RDW 13.0 12/12/2022 0156   RDW 12.9 10/18/2015 0934   LYMPHSABS 2.4 12/11/2022 1403   LYMPHSABS 2.2 10/18/2015 0934   MONOABS 0.7 12/11/2022 1403   MONOABS 0.6 10/18/2015 0934   EOSABS 0.0 12/11/2022 1403   EOSABS 0.1 10/18/2015 0934   BASOSABS 0.0 12/11/2022 1403   BASOSABS 0.0 10/18/2015 0934    BMET    Component Value Date/Time   NA 137 12/12/2022 0156   NA 138 11/29/2014 0000   K  4.1 12/12/2022 0156   CL 104 12/12/2022 0156   CO2 22 12/12/2022 0156   GLUCOSE 129 (H) 12/12/2022 0156   BUN 14 12/12/2022 0156   BUN 18 11/29/2014 0000   CREATININE 0.94 12/12/2022 0156   CREATININE 1.06 08/31/2015 1539   CALCIUM 8.9 12/12/2022 0156   GFRNONAA >60 12/12/2022 0156   GFRNONAA 78 08/31/2015 1539    IMAGING past 24 hours MR BRAIN WO CONTRAST  Result Date: 12/11/2022 CLINICAL DATA:  Acute  neurologic deficit EXAM: MRI HEAD WITHOUT CONTRAST MRA HEAD WITHOUT CONTRAST TECHNIQUE: Multiplanar, multi-echo pulse sequences of the brain and surrounding structures were acquired without intravenous contrast. Angiographic images of the Circle of Willis were acquired using MRA technique without intravenous contrast. COMPARISON:  None Available. FINDINGS: MRI HEAD FINDINGS Brain: There are 2 small foci of abnormal diffusion restriction within the posterior left frontal lobe. No acute or chronic hemorrhage. Minimal multifocal hyperintense T2-weight signal within the white matter. A partially empty sella turcica is incidentally noted. Vascular: Normal flow voids. Skull and upper cervical spine: Normal calvarium and skull base. Visualized upper cervical spine and soft tissues are normal. Sinuses/Orbits:No paranasal sinus fluid levels or advanced mucosal thickening. No mastoid or middle ear effusion. Normal orbits. MRA HEAD FINDINGS POSTERIOR CIRCULATION: --Vertebral arteries: Normal --Inferior cerebellar  arteries: Normal. --Basilar artery: Normal. --Superior cerebellar arteries: Normal. --Posterior cerebral arteries: Normal. ANTERIOR CIRCULATION: --Intracranial internal carotid arteries: Normal. --Anterior cerebral arteries (ACA): Normal. --Middle cerebral arteries (MCA): Normal. IMPRESSION: 1. Two small acute infarcts within the posterior left frontal lobe. No hemorrhage or mass effect. 2. Normal intracranial MRA. Electronically Signed   By: Deatra Robinson M.D.   On: 12/11/2022 23:51   MR ANGIO HEAD  WO CONTRAST  Result Date: 12/11/2022 CLINICAL DATA:  Acute  neurologic deficit EXAM: MRI HEAD WITHOUT CONTRAST MRA HEAD WITHOUT CONTRAST TECHNIQUE: Multiplanar, multi-echo pulse sequences of the brain and surrounding structures were acquired without intravenous contrast. Angiographic images of the Circle of Willis were acquired using MRA technique without intravenous contrast. COMPARISON:  None Available. FINDINGS: MRI HEAD FINDINGS Brain: There are 2 small foci of abnormal diffusion restriction within the posterior left frontal lobe. No acute or chronic hemorrhage. Minimal multifocal hyperintense T2-weight signal within the white matter. A partially empty sella turcica is incidentally noted. Vascular: Normal flow voids. Skull and upper cervical spine: Normal calvarium and skull base. Visualized upper cervical spine and soft tissues are normal. Sinuses/Orbits:No paranasal sinus fluid levels or advanced mucosal thickening. No mastoid or middle ear effusion. Normal orbits. MRA HEAD FINDINGS POSTERIOR CIRCULATION: --Vertebral arteries: Normal --Inferior cerebellar arteries: Normal. --Basilar artery: Normal. --Superior cerebellar arteries: Normal. --Posterior cerebral arteries: Normal. ANTERIOR CIRCULATION: --Intracranial internal carotid arteries: Normal. --Anterior cerebral arteries (ACA): Normal. --Middle cerebral arteries (MCA): Normal. IMPRESSION: 1. Two small acute infarcts within the posterior left frontal lobe. No hemorrhage or mass effect. 2. Normal intracranial MRA. Electronically Signed   By: Deatra Robinson M.D.   On: 12/11/2022 23:51   CT HEAD WO CONTRAST  Result Date: 12/11/2022 CLINICAL DATA:  Neuro deficit, acute, stroke suspected EXAM: CT HEAD WITHOUT CONTRAST TECHNIQUE: Contiguous axial images were obtained from the base of the skull through the vertex without intravenous contrast. RADIATION DOSE REDUCTION: This exam was performed according to the departmental dose-optimization program which  includes automated exposure control, adjustment of the mA and/or kV according to patient size and/or use of iterative reconstruction technique. COMPARISON:  CT Head 02/06/05 FINDINGS: Brain: No hemorrhage. No hydrocephalus. No extra-axial fluid collection. No CT evidence of an acute cortical infarct. No mass effect. No mass lesion. Mega cisterna magna. Enlarged and partially empty sella. Vascular: No hyperdense vessel or unexpected calcification. Skull: Normal. Negative for fracture or focal lesion. Sinuses/Orbits: No middle ear or mastoid effusion. Paranasal sinuses are clear. Orbits are unremarkable. Other: None. IMPRESSION: No hemorrhage or CT evidence of an acute cortical infarct. Electronically Signed   By: Lorenza Cambridge M.D.   On: 12/11/2022 16:25    Vitals:   12/11/22 1724 12/12/22 0151 12/12/22 1054 12/12/22 1127  BP: (!) 166/107 125/64 (!) 143/86 (!) 162/85  Pulse: 95 88 89 99  Resp: 16 (!) 21 (!) 21 13  Temp: 98.8 F (37.1 C) 97.8 F (36.6 C) 98 F (36.7 C) 98.1 F (36.7 C)  TempSrc:  Oral Oral Oral  SpO2: 97% 96% 95%   Weight:      Height:         PHYSICAL EXAM General:  Alert, well-nourished, well-developed patient in no acute distress Psych:  Mood and affect appropriate for situation CV: Regular rate and rhythm on monitor Respiratory:  Regular, unlabored respirations on room air GI: Abdomen soft and nontender   NEURO:  Mental Status: AA&Ox3, patient is able to give clear and coherent history Speech/Language: speech is without dysarthria or aphasia.  Naming, repetition, fluency, and comprehension intact.  Cranial Nerves:  II: PERRL. Visual fields full.  III, IV, VI: EOMI. Eyelids elevate symmetrically.  V: Sensation is intact to light touch and symmetrical to face.  VII: Face is symmetrical resting and smiling VIII: hearing intact to voice. IX, X: Palate elevates symmetrically. Phonation is normal.  JX:[id number] shrug 5/5. XII: tongue is midline without  fasciculations. Motor: 5/5 strength to all muscle groups tested.  Tone: is normal and bulk is normal Sensation- Intact to light touch bilaterally. Extinction absent to light touch to DSS.   Coordination: FTN intact bilaterally, HKS: no ataxia in BLE.No drift.  Gait- deferred  ASSESSMENT/PLAN  Acute Ischemic Infarct:  left frontal lobe Etiology: Likely cardioembolic versus cryptogenic CT head No acute abnormality.    MRI  Two small acute infarcts within the posterior left frontal lobe  MRA no LVO Carotid Doppler ordered 2D Echo ordered Recommend TEE.  Recommend 30 day monitoring  LDL 100 HgbA1c 5.8 VTE prophylaxis -Lovenox No antithrombotic prior to admission, now on aspirin 81 mg daily and clopidogrel 75 mg daily for 3 weeks and then aspirin alone. Therapy recommendations:  Pending Disposition: Pending  Hypertension Home meds: Losartan 25 mg, Stable Blood Pressure Goal: SBP less than 160   Hyperlipidemia Home meds: None LDL 100, goal < 70 Add this to her 20 mg Continue statin at discharge  Tobacco Abuse Former cigar smoker  Dysphagia Patient has post-stroke dysphagia, SLP consulted    Diet   Diet regular Room service appropriate? Yes; Fluid consistency: Thin   Advance diet as tolerated  Other Stroke Risk Factors ETOH use, alcohol level <10, advised to drink no more than 2 drink(s) a day Obesity, Body mass index is 41.42 kg/m., BMI >/= 30 associated with increased stroke risk, recommend weight loss, diet and exercise as appropriate  Obstructive sleep apnea, on CPAP at home   Other Active Problems   Hospital day # 0  Gevena Mart DNP, ACNPC-AG  [facility]  STROKE MD NOTE :  I have personally obtained history,examined this patient, reviewed notes, independently viewed imaging studies, participated in medical decision making and plan of care.ROS completed by me personally and pertinent positives fully documented  I have made any additions or  clarifications directly to the above note. Agree with note above.  Patient presented with transient episode of extremity aphasia followed by several hours of right leg weakness and heaviness with MRI showing embolic left frontal ACA branch infarct.  He is obtaining near complete improvement.  Recommend continue ongoing stroke workup check TTE and prolonged cardiac monitoring at discharge for paroxysmal A-fib.  Aspirin and Plavix for 3 weeks followed by aspirin alone and aggressive risk factor modification.  Patient counseled to be compliant with using his CPAP at home every day for sleep apnea.  Discussed with patient and Dr. Irene Limbo.  Patient initially expressed interest in participation in the [place] stroke study and was given written information but later decided not to participate in the study.  All greater than 50% time during this 50-minute visit was spent in counseling and coordination of care and discussion patient and caregiver answering questions.  Delia Heady, MD Medical Director [facility] Pager: [phone] 12/12/2022 2:38 PM     To contact Stroke Continuity provider, please refer to [email or web address]. After hours, contact General Neurology

## 2022-12-12 NOTE — Consult Note (Signed)
NEUROLOGY CONSULT NOTE   Date of service: December 12, 2022 Patient Name: Russell Thomas MRN:  [id number] DOB:  [birth year] Chief Complaint: "aphasia and R leg weakness" Requesting Provider: Briscoe Deutscher, MD  History of Present Illness  Russell Thomas is a 62 y.o. male with hx of Prediabetes, OSA, HLD who presents with an episode of aphasia lasting 10 secs, followed by several hours of mild R leg weakness and feeling like he is dragging his R leg.  Reports yesterday was with a customer on phone and had a 10 secs episode where he was unable to find the right word. He got up from the chair and started walking and noted R leg weakness and feeling like he was dragging his R leg and foot. This went on for an hour so he took a nap and when he woke up from the nap, this had significantly improved and then this AM, pretty much resolved.  At the insistence of his family, he came to the ED for further evaluation and workup.  LKW: 12/10/22 Modified rankin score: 0-Completely asymptomatic and back to baseline post- stroke IV Thrombolysis: not offered, outside window EVT: not offered, outside window  NIHSS components Score: Comment  1a Level of Conscious 0[x]  1[]  2[]  3[]      1b LOC Questions 0[x]  1[]  2[]       1c LOC Commands 0[x]  1[]  2[]       2 Best Gaze 0[x]  1[]  2[]       3 Visual 0[x]  1[]  2[]  3[]      4 Facial Palsy 0[x]  1[]  2[]  3[]      5a Motor Arm - left 0[x]  1[]  2[]  3[]  4[]  UN[]    5b Motor Arm - Right 0[x]  1[]  2[]  3[]  4[]  UN[]    6a Motor Leg - Left 0[x]  1[]  2[]  3[]  4[]  UN[]    6b Motor Leg - Right 0[x]  1[]  2[]  3[]  4[]  UN[]    7 Limb Ataxia 0[x]  1[]  2[]  3[]  UN[]     8 Sensory 0[x]  1[]  2[]  UN[]      9 Best Language 0[x]  1[]  2[]  3[]      10 Dysarthria 0[x]  1[]  2[]  UN[]      11 Extinct. and Inattention 0[x]  1[]  2[]       TOTAL: 0      ROS  Comprehensive ROS performed and pertinent positives documented in HPI  Past History   Past Medical History:  Diagnosis Date   Adenomatous colon polyp  2008   Dr Arlyce Dice, GI   Allergic rhinitis    Anxiety    Diverticulosis    Gallbladder attack 2015   stones present   Gastritis    Hyperlipemia 2009   IBS (irritable bowel syndrome)    Internal hemorrhoids 2012   banded    Lichen planus    Pre-diabetes    Prostate cancer (HCC)    Seasonal allergies    Sleep apnea    [facility]    Past Surgical History:  Procedure Laterality Date   BIOPSY PROSTATE  12/03/2016   COLONOSCOPY  04/10/2010, 08/12/2006   3/12 with banding; 2008: adenomatous polyp, diverticulosis   INCISIONAL HERNIA REPAIR N/A 07/18/2020   Procedure: LAPAROSCOPIC INCISIONAL HERNIA REPAIR WITH MESH;  Surgeon: Abigail Miyamoto, MD;  Location: [facility];  Service: General;  Laterality: N/A;   LYMPHADENECTOMY Bilateral 02/14/2017   Procedure: LYMPHADENECTOMY/PELVIC;  Surgeon: Heloise Purpura, MD;  Location: [facility];  Service: Urology;  Laterality: Bilateral;   ROBOT ASSISTED LAPAROSCOPIC RADICAL PROSTATECTOMY N/A 02/14/2017   Procedure: XI ROBOTIC ASSISTED  LAPAROSCOPIC RADICAL PROSTATECTOMY LEVEL 3;  Surgeon: Heloise Purpura, MD;  Location: [facility];  Service: Urology;  Laterality: N/A;   SEPTOPLASTY     thumb surgery     TONSILLECTOMY     UPPER GASTROINTESTINAL ENDOSCOPY  10/24/2001   gastritis   UPPER GASTROINTESTINAL ENDOSCOPY  11/03/12    Family History: Family History  Problem Relation Age of Onset   Thyroid disease Mother        hypothyroidism   Allergies Mother    Parkinson's disease Mother        not definite   Atrial fibrillation Mother    Colitis Father    Alcohol abuse Paternal Grandfather     Social History  reports that he has quit smoking. His smoking use included cigars. He has never used smokeless tobacco. He reports current alcohol use. He reports that he does not use drugs.  Allergies  Allergen Reactions   Cephalosporins Hives and Rash   Ciprofloxacin Rash   Iodinated Contrast Media Hives, Itching and Rash    GADOLINIUM   Omeprazole  Rash   Penicillins Itching, Rash and Other (See Comments)    Weezing, blisters Has patient had a PCN reaction causing immediate rash, facial/tongue/throat swelling, SOB or lightheadedness with hypotension: Yes Has patient had a PCN reaction causing severe rash involving mucus membranes or skin necrosis: No Has patient had a PCN reaction that required hospitalization: No Has patient had a PCN reaction occurring within the last 10 years: No If all of the above answers are "NO", then may proceed with Cephalosporin use.    Shrimp [Shellfish Allergy] Rash   Sulfa Antibiotics Hives and Rash   Sulfonamide Derivatives Rash   Omnipaque [Iohexol] Hives and Itching    Scratchy palms, scratchy throat, face and chest redness, hives all over. (CT fluoroscopic Contrast)      Medications   Current Facility-Administered Medications:     stroke: early stages of recovery book, , Does not apply, Once, Opyd, Lavone Neri, MD   acetaminophen (TYLENOL) tablet 650 mg, 650 mg, Oral, Q4H PRN **OR** acetaminophen (TYLENOL) 160 MG/5ML solution 650 mg, 650 mg, Per Tube, Q4H PRN **OR** acetaminophen (TYLENOL) suppository 650 mg, 650 mg, Rectal, Q4H PRN, Opyd, Lavone Neri, MD   aspirin EC tablet 81 mg, 81 mg, Oral, Daily, Opyd, Lavone Neri, MD   clopidogrel (PLAVIX) tablet 75 mg, 75 mg, Oral, Daily, Opyd, Lavone Neri, MD   enoxaparin (LOVENOX) injection 40 mg, 40 mg, Subcutaneous, Q24H, Opyd, Lavone Neri, MD, 40 mg at 12/12/22 0156   LORazepam (ATIVAN) tablet 1 mg, 1 mg, Oral, QHS PRN, Opyd, Lavone Neri, MD   senna-docusate (Senokot-S) tablet 1 tablet, 1 tablet, Oral, QHS PRN, Opyd, Lavone Neri, MD   sodium chloride flush (NS) 0.9 % injection 3 mL, 3 mL, Intravenous, Once, Opyd, Lavone Neri, MD  Current Outpatient Medications:    hydrocortisone cream 1 %, Apply 1 Application topically 2 (two) times daily., Disp: , Rfl:    losartan (COZAAR) 25 MG tablet, Take 25 mg by mouth daily., Disp: , Rfl:    tiZANidine (ZANAFLEX) 4 MG tablet,  Take 4 mg by mouth at bedtime., Disp: , Rfl:    traMADol (ULTRAM) 50 MG tablet, Take 50 mg by mouth 4 (four) times daily., Disp: , Rfl:    triamcinolone (NASACORT ALLERGY 24HR) 55 MCG/ACT AERO nasal inhaler, Place 1 spray into the nose daily., Disp: , Rfl:    baclofen (LIORESAL) 10 MG tablet, Take 1 tablet (10 mg total) by mouth  3 (three) times daily., Disp: 30 each, Rfl: 0   Brimonidine Tartrate (LUMIFY) 0.025 % SOLN, Place 1 drop into both eyes daily., Disp: , Rfl:    loratadine (CLARITIN) 10 MG tablet, Take 10 mg by mouth daily., Disp: , Rfl:    LORazepam (ATIVAN) 1 MG tablet, Take 1 mg by mouth at bedtime as needed., Disp: , Rfl:    mometasone (NASONEX) 50 MCG/ACT nasal spray, 2 sprays by Both Nostrils route daily., Disp: , Rfl:    Multiple Vitamins-Minerals (CENTRUM SILVER PO), Take 1 tablet by mouth daily., Disp: , Rfl:    traZODone (DESYREL) 50 MG tablet, Take 1-2 tablets (50-100 mg total) by mouth at bedtime as needed for sleep., Disp: 30 tablet, Rfl: 3  Vitals   Vitals:   December 22, 2022 1335 12/22/22 1357 Dec 22, 2022 1724 12/12/22 0151  BP: (!) 139/97  (!) 166/107 125/64  Pulse: 86  95 88  Resp: 18  16 (!) 21  Temp: 98.3 F (36.8 C)  98.8 F (37.1 C) 97.8 F (36.6 C)  TempSrc:    Oral  SpO2: 97%  97% 96%  Weight:  (!) 142.4 kg    Height:  6\' 1"  (1.854 m)      Body mass index is 41.42 kg/m.  Physical Exam   Constitutional: Appears well-developed and well-nourished.  Psych: Affect appropriate to situation.  Eyes: No scleral injection.  HENT: No OP obstruction.  Head: Normocephalic.  Cardiovascular: Normal rate and regular rhythm.  Respiratory: Effort normal, non-labored breathing.  GI: Soft.  No distension. There is no tenderness.  Skin: WDI.   Neurologic Examination  Mental status/Cognition: Alert, oriented to self, place, month and year, good attention.  Speech/language: Fluent, comprehension intact, object naming intact, repetition intact.  Cranial nerves:   CN II  Pupils equal and reactive to light, no VF deficits    CN III,IV,VI EOM intact, no gaze preference or deviation, no nystagmus    CN V normal sensation in V1, V2, and V3 segments bilaterally    CN VII no asymmetry, no nasolabial fold flattening    CN VIII normal hearing to speech    CN IX & X normal palatal elevation, no uvular deviation    CN XI 5/5 head turn and 5/5 shoulder shrug bilaterally    CN XII midline tongue protrusion    Motor:  Muscle bulk: normal, tone normal, pronator drift none tremor none Mvmt Root Nerve  Muscle Right Left Comments  SA C5/6 Ax Deltoid 5 5   EF C5/6 Mc Biceps 5 5   EE C6/7/8 Rad Triceps 5 5   WF C6/7 Med FCR     WE C7/8 PIN ECU     F Ab C8/T1 U ADM/FDI 5 5   HF L1/2/3 Fem Illopsoas 4+ 5   KE L2/3/4 Fem Quad 5 5   DF L4/5 D Peron Tib Ant 5 5   PF S1/2 Tibial Grc/Sol 5 5    Sensation:  Light touch Intact throughout   Pin prick    Temperature    Vibration   Proprioception    Coordination/Complex Motor:  - Finger to Nose intact BL - Heel to shin intact BL - Rapid alternating movement are normal - Gait: deferred.  Labs/Imaging/Neurodiagnostic studies   CBC:  Recent Labs  Lab December 22, 2022 1403 12-22-2022 1412 12/12/22 0156  WBC 9.7  --  9.4  NEUTROABS 6.6  --   --   HGB 15.9 16.0 15.0  HCT 46.4 47.0 44.5  MCV 89.7  --  91.6  PLT 222  --  209    Basic Metabolic Panel:  Lab Results  Component Value Date   NA 139 12/11/2022   K 4.0 12/11/2022   CO2 22 12/11/2022   GLUCOSE 88 12/11/2022   BUN 16 12/11/2022   CREATININE 1.00 12/11/2022   CALCIUM 9.0 12/11/2022   GFRNONAA >60 12/11/2022   GFRAA >60 02/11/2017    Lipid Panel:  Lab Results  Component Value Date   LDLCALC 97 10/16/2022    HgbA1c:  Lab Results  Component Value Date   HGBA1C 6.3 10/16/2022    Urine Drug Screen: No results found for: "LABOPIA", "COCAINSCRNUR", "LABBENZ", "AMPHETMU", "THCU", "LABBARB"   Alcohol Level     Component Value Date/Time   ETH <10  12/11/2022 1403    INR  Lab Results  Component Value Date   INR 1.0 12/11/2022    APTT  Lab Results  Component Value Date   APTT 27 12/11/2022    AED levels: No results found for: "PHENYTOIN", "ZONISAMIDE", "LAMOTRIGINE", "LEVETIRACETA"    CT Head without contrast(Personally reviewed): CTH was negative for a large hypodensity concerning for a large territory infarct or hyperdensity concerning for an ICH  MR Angio head without contrast and Carotid Duplex BL(Personally reviewed): No LVO on MRA head. US carotid doppler is pending.  MRI Brain(Personally reviewed): 2 small cortical L frontal strokes  Impression   Russell Thomas is a 62 y.o. male with hx of Prediabetes, OSA, HLD who presents with an episode of aphasia lasting 10 secs, followed by several hours of mild R leg weakness and feeling like he is dragging his R leg.  He was found to have 2 small cortical L frontal lobe infarcts. Etiology is pending full workup.  Recommendations  - Frequent Neuro checks per stroke unit protocol - Recommend Vascular imaging with MRA Angio Head without contrast and US Carotid doppler - Recommend obtaining TTE - Recommend obtaining Lipid panel with LDL - Please start statin if LDL > 70 - Recommend HbA1c to evaluate for diabetes and how well it is controlled. - Antithrombotic - Aspirin 81mg  daily along with plavix 75mg  daily x 21 days, followed by Aspirin 81mg  daily alone. - Recommend DVT ppx - SBP goal - aim for gradual normotension. - Recommend Telemetry monitoring for arrythmia - Recommend bedside swallow screen prior to PO intake. - Stroke education booklet - Recommend PT/OT/SLP consult  ______________________________________________________________________    Welton Flakes [facility]

## 2022-12-12 NOTE — Progress Notes (Addendum)
  Progress Note   Patient: Russell Thomas ZOX:[id number] DOB: August 02, 1960 DOA: 12/11/2022     0 DOS: the patient was seen and examined on 12/12/2022   Brief hospital course: 62 year old male PMH including hypertension, hyperlipidemia OSA, presented with transient aphasia and right leg weakness.  Imaging revealed 2 small acute cortical infarcts in the patient was admitted for further evaluation.  Consultants Neurology   Procedures None   Assessment and Plan: Acute ischemic CVA   Had transient speech difficulty and right leg weakness at home.  No residual deficits.  Imaging confirmed 2 small acute ischemic left posterior frontal lobe infarcts. Continue management as per neurology.  Follow-up echocardiogram.  Neurology considering further testing including possible TEE and enrollment in study. Home when neurologic workup complete, based on neurology plans, likely tomorrow  Prediabetes Recommend weight loss, outpatient follow-up.  Could consider metformin.   Fasting blood sugar was 129.  Do not think frequent CV checks indicated at this time.   Anxiety, insomnia  Ativan as needed   OSA  Continue CPAP QHS  Morbid obesity Body mass index is 41.42 kg/m. Consult dietician     Subjective:  Feels fine No neurology complaints  Physical Exam: Vitals:   12/11/22 1724 12/12/22 0151 12/12/22 1054 12/12/22 1127  BP: (!) 166/107 125/64 (!) 143/86 (!) 162/85  Pulse: 95 88 89 99  Resp: 16 (!) 21 (!) 21 13  Temp: 98.8 F (37.1 C) 97.8 F (36.6 C) 98 F (36.7 C) 98.1 F (36.7 C)  TempSrc:  Oral Oral Oral  SpO2: 97% 96% 95%   Weight:      Height:       Physical Exam Vitals reviewed.  Constitutional:      General: He is not in acute distress.    Appearance: He is not ill-appearing or toxic-appearing.  Cardiovascular:     Rate and Rhythm: Normal rate and regular rhythm.     Heart sounds: No murmur heard. Pulmonary:     Effort: Pulmonary effort is normal. No respiratory  distress.     Breath sounds: No wheezing, rhonchi or rales.  Musculoskeletal:     Right lower leg: No edema.     Left lower leg: No edema.  Neurological:     Mental Status: He is alert.     Cranial Nerves: No cranial nerve deficit.     Motor: No weakness.     Coordination: Coordination normal.  Psychiatric:        Mood and Affect: Mood normal.        Behavior: Behavior normal.     Data Reviewed: BMP unremarkable LFTs unremarkable LDL 100 CBC within normal limits Hemoglobin A1c 5.8  Family Communication: none   Disposition: Status is: Observation   Planned Discharge Destination: Home    Time spent: 35 minutes  Author: Brendia Sacks, MD 12/12/2022 12:27 PM  For on call review [email or web address].

## 2022-12-12 NOTE — H&P (View-Only) (Signed)
STROKE TEAM PROGRESS NOTE   BRIEF HPI Mr. Russell Thomas is a 62 y.o. male with history of Prediabetes, OSA, HLD who presents with an episode of aphasia lasting 10 secs, followed by several hours of mild R leg weakness and feeling like he is dragging his R leg.    SIGNIFICANT HOSPITAL EVENTS  11/5 MRI brain Two small acute infarcts within the posterior left frontal lobe   INTERIM HISTORY/SUBJECTIVE Patient states he began having trouble on Monday ~330p when he began to feel lightheaded and  had trouble with his speech lasting about 10 seconds, 30 seconds to 1 minute of right leg weakness and dragging his leg and he went to go lay down for a while.  The whole episode lasted for about 3-4 hours  MRI brain revealed Two small acute infarcts within the posterior left frontal lobe  Recommend 81 milligram aspirin and 75 mg Plavix for 3 weeks then aspirin alone. Recommend a TEE and cardiology is aware Also recommend Crestor 20 mg  Patient was given information on [facility] Stroke study and that he was discussed with Dr.Lessie Manigo.  Will read over materials and let us know his decision on whether or not he is interested in enrolling  OBJECTIVE  CBC    Component Value Date/Time   WBC 9.4 12/12/2022 0156   RBC 4.86 12/12/2022 0156   HGB 15.0 12/12/2022 0156   HGB 15.4 10/18/2015 0934   HCT 44.5 12/12/2022 0156   HCT 44.2 10/18/2015 0934   PLT 209 12/12/2022 0156   PLT 195 10/18/2015 0934   MCV 91.6 12/12/2022 0156   MCV 90.2 10/18/2015 0934   MCH 30.9 12/12/2022 0156   MCHC 33.7 12/12/2022 0156   RDW 13.0 12/12/2022 0156   RDW 12.9 10/18/2015 0934   LYMPHSABS 2.4 12/11/2022 1403   LYMPHSABS 2.2 10/18/2015 0934   MONOABS 0.7 12/11/2022 1403   MONOABS 0.6 10/18/2015 0934   EOSABS 0.0 12/11/2022 1403   EOSABS 0.1 10/18/2015 0934   BASOSABS 0.0 12/11/2022 1403   BASOSABS 0.0 10/18/2015 0934    BMET    Component Value Date/Time   NA 137 12/12/2022 0156   NA 138 11/29/2014 0000   K  4.1 12/12/2022 0156   CL 104 12/12/2022 0156   CO2 22 12/12/2022 0156   GLUCOSE 129 (H) 12/12/2022 0156   BUN 14 12/12/2022 0156   BUN 18 11/29/2014 0000   CREATININE 0.94 12/12/2022 0156   CREATININE 1.06 08/31/2015 1539   CALCIUM 8.9 12/12/2022 0156   GFRNONAA >60 12/12/2022 0156   GFRNONAA 78 08/31/2015 1539    IMAGING past 24 hours MR BRAIN WO CONTRAST  Result Date: 12/11/2022 CLINICAL DATA:  Acute  neurologic deficit EXAM: MRI HEAD WITHOUT CONTRAST MRA HEAD WITHOUT CONTRAST TECHNIQUE: Multiplanar, multi-echo pulse sequences of the brain and surrounding structures were acquired without intravenous contrast. Angiographic images of the Circle of Willis were acquired using MRA technique without intravenous contrast. COMPARISON:  None Available. FINDINGS: MRI HEAD FINDINGS Brain: There are 2 small foci of abnormal diffusion restriction within the posterior left frontal lobe. No acute or chronic hemorrhage. Minimal multifocal hyperintense T2-weight signal within the white matter. A partially empty sella turcica is incidentally noted. Vascular: Normal flow voids. Skull and upper cervical spine: Normal calvarium and skull base. Visualized upper cervical spine and soft tissues are normal. Sinuses/Orbits:No paranasal sinus fluid levels or advanced mucosal thickening. No mastoid or middle ear effusion. Normal orbits. MRA HEAD FINDINGS POSTERIOR CIRCULATION: --Vertebral arteries: Normal --Inferior cerebellar  arteries: Normal. --Basilar artery: Normal. --Superior cerebellar arteries: Normal. --Posterior cerebral arteries: Normal. ANTERIOR CIRCULATION: --Intracranial internal carotid arteries: Normal. --Anterior cerebral arteries (ACA): Normal. --Middle cerebral arteries (MCA): Normal. IMPRESSION: 1. Two small acute infarcts within the posterior left frontal lobe. No hemorrhage or mass effect. 2. Normal intracranial MRA. Electronically Signed   By: Deatra Robinson M.D.   On: 12/11/2022 23:51   MR ANGIO HEAD  WO CONTRAST  Result Date: 12/11/2022 CLINICAL DATA:  Acute  neurologic deficit EXAM: MRI HEAD WITHOUT CONTRAST MRA HEAD WITHOUT CONTRAST TECHNIQUE: Multiplanar, multi-echo pulse sequences of the brain and surrounding structures were acquired without intravenous contrast. Angiographic images of the Circle of Willis were acquired using MRA technique without intravenous contrast. COMPARISON:  None Available. FINDINGS: MRI HEAD FINDINGS Brain: There are 2 small foci of abnormal diffusion restriction within the posterior left frontal lobe. No acute or chronic hemorrhage. Minimal multifocal hyperintense T2-weight signal within the white matter. A partially empty sella turcica is incidentally noted. Vascular: Normal flow voids. Skull and upper cervical spine: Normal calvarium and skull base. Visualized upper cervical spine and soft tissues are normal. Sinuses/Orbits:No paranasal sinus fluid levels or advanced mucosal thickening. No mastoid or middle ear effusion. Normal orbits. MRA HEAD FINDINGS POSTERIOR CIRCULATION: --Vertebral arteries: Normal --Inferior cerebellar arteries: Normal. --Basilar artery: Normal. --Superior cerebellar arteries: Normal. --Posterior cerebral arteries: Normal. ANTERIOR CIRCULATION: --Intracranial internal carotid arteries: Normal. --Anterior cerebral arteries (ACA): Normal. --Middle cerebral arteries (MCA): Normal. IMPRESSION: 1. Two small acute infarcts within the posterior left frontal lobe. No hemorrhage or mass effect. 2. Normal intracranial MRA. Electronically Signed   By: Deatra Robinson M.D.   On: 12/11/2022 23:51   CT HEAD WO CONTRAST  Result Date: 12/11/2022 CLINICAL DATA:  Neuro deficit, acute, stroke suspected EXAM: CT HEAD WITHOUT CONTRAST TECHNIQUE: Contiguous axial images were obtained from the base of the skull through the vertex without intravenous contrast. RADIATION DOSE REDUCTION: This exam was performed according to the departmental dose-optimization program which  includes automated exposure control, adjustment of the mA and/or kV according to patient size and/or use of iterative reconstruction technique. COMPARISON:  CT Head 02/06/05 FINDINGS: Brain: No hemorrhage. No hydrocephalus. No extra-axial fluid collection. No CT evidence of an acute cortical infarct. No mass effect. No mass lesion. Mega cisterna magna. Enlarged and partially empty sella. Vascular: No hyperdense vessel or unexpected calcification. Skull: Normal. Negative for fracture or focal lesion. Sinuses/Orbits: No middle ear or mastoid effusion. Paranasal sinuses are clear. Orbits are unremarkable. Other: None. IMPRESSION: No hemorrhage or CT evidence of an acute cortical infarct. Electronically Signed   By: Lorenza Cambridge M.D.   On: 12/11/2022 16:25    Vitals:   12/11/22 1724 12/12/22 0151 12/12/22 1054 12/12/22 1127  BP: (!) 166/107 125/64 (!) 143/86 (!) 162/85  Pulse: 95 88 89 99  Resp: 16 (!) 21 (!) 21 13  Temp: 98.8 F (37.1 C) 97.8 F (36.6 C) 98 F (36.7 C) 98.1 F (36.7 C)  TempSrc:  Oral Oral Oral  SpO2: 97% 96% 95%   Weight:      Height:         PHYSICAL EXAM General:  Alert, well-nourished, well-developed patient in no acute distress Psych:  Mood and affect appropriate for situation CV: Regular rate and rhythm on monitor Respiratory:  Regular, unlabored respirations on room air GI: Abdomen soft and nontender   NEURO:  Mental Status: AA&Ox3, patient is able to give clear and coherent history Speech/Language: speech is without dysarthria or aphasia.  Naming, repetition, fluency, and comprehension intact.  Cranial Nerves:  II: PERRL. Visual fields full.  III, IV, VI: EOMI. Eyelids elevate symmetrically.  V: Sensation is intact to light touch and symmetrical to face.  VII: Face is symmetrical resting and smiling VIII: hearing intact to voice. IX, X: Palate elevates symmetrically. Phonation is normal.  JX:BJYNWGNF shrug 5/5. XII: tongue is midline without  fasciculations. Motor: 5/5 strength to all muscle groups tested.  Tone: is normal and bulk is normal Sensation- Intact to light touch bilaterally. Extinction absent to light touch to DSS.   Coordination: FTN intact bilaterally, HKS: no ataxia in BLE.No drift.  Gait- deferred  ASSESSMENT/PLAN  Acute Ischemic Infarct:  left frontal lobe Etiology: Likely cardioembolic versus cryptogenic CT head No acute abnormality.    MRI  Two small acute infarcts within the posterior left frontal lobe  MRA no LVO Carotid Doppler ordered 2D Echo ordered Recommend TEE.  Recommend 30 day monitoring  LDL 100 HgbA1c 5.8 VTE prophylaxis -Lovenox No antithrombotic prior to admission, now on aspirin 81 mg daily and clopidogrel 75 mg daily for 3 weeks and then aspirin alone. Therapy recommendations:  Pending Disposition: Pending  Hypertension Home meds: Losartan 25 mg, Stable Blood Pressure Goal: SBP less than 160   Hyperlipidemia Home meds: None LDL 100, goal < 70 Add this to her 20 mg Continue statin at discharge  Tobacco Abuse Former cigar smoker  Dysphagia Patient has post-stroke dysphagia, SLP consulted    Diet   Diet regular Room service appropriate? Yes; Fluid consistency: Thin   Advance diet as tolerated  Other Stroke Risk Factors ETOH use, alcohol level <10, advised to drink no more than 2 drink(s) a day Obesity, Body mass index is 41.42 kg/m., BMI >/= 30 associated with increased stroke risk, recommend weight loss, diet and exercise as appropriate  Obstructive sleep apnea, on CPAP at home   Other Active Problems   Hospital day # 0  Gevena Mart DNP, ACNPC-AG  [facility]  STROKE MD NOTE :  I have personally obtained history,examined this patient, reviewed notes, independently viewed imaging studies, participated in medical decision making and plan of care.ROS completed by me personally and pertinent positives fully documented  I have made any additions or  clarifications directly to the above note. Agree with note above.  Patient presented with transient episode of extremity aphasia followed by several hours of right leg weakness and heaviness with MRI showing embolic left frontal ACA branch infarct.  He is obtaining near complete improvement.  Recommend continue ongoing stroke workup check TTE and prolonged cardiac monitoring at discharge for paroxysmal A-fib.  Aspirin and Plavix for 3 weeks followed by aspirin alone and aggressive risk factor modification.  Patient counseled to be compliant with using his CPAP at home every day for sleep apnea.  Discussed with patient and Dr. Irene Limbo.  Patient initially expressed interest in participation in the [place] stroke study and was given written information but later decided not to participate in the study.  All greater than 50% time during this 50-minute visit was spent in counseling and coordination of care and discussion patient and caregiver answering questions.  Delia Heady, MD Medical Director [facility] Pager: [phone] 12/12/2022 2:38 PM     To contact Stroke Continuity provider, please refer to [email or web address]. After hours, contact General Neurology

## 2022-12-12 NOTE — Evaluation (Signed)
Physical Therapy Evaluation Patient Details Name: NICK STULTS MRN: [id number] DOB: [birth year] Today's Date: 12/12/2022  History of Present Illness  Pt is a 62 yo male admitted with short episode of aphasia followed by R leg weakness. Pt found to have 2 small acute infarts on the Post L frontal lobe. PMH: OSA, HLD, HTN.  Clinical Impression  Pt seen for PT evaluation with pt noting prior to admission he was independent, driving, working, denies falls. Pt reports RLE symptoms have resolved but notes occasional lightheadedness. BP checked & pt negative for orthostatics. Pt is able to ambulate in hallway without AD independently. Pt does not demonstrate any need for further acute PT services. PT to complete current orders, please re-consult if new needs arise.  BP checked in LUE  BP  Supine  138/91 mmHg MAP 105  Sitting 128/93 mmHg MAP 105  Standing at 0 126/89 mmHg MAP 102  Standing at 3 minutes 139/104 mmHg MAP 116         If plan is discharge home, recommend the following:     Can travel by private vehicle        Equipment Recommendations None recommended by PT  Recommendations for Other Services       Functional Status Assessment Patient has not had a recent decline in their functional status     Precautions / Restrictions Precautions Precautions: None Restrictions Weight Bearing Restrictions: No      Mobility  Bed Mobility Overal bed mobility: Modified Independent                  Transfers Overall transfer level: Independent Equipment used: None                    Ambulation/Gait Ambulation/Gait assistance: Independent Gait Distance (Feet):  (>150 ft) Assistive device: None         General Gait Details: Pt engaged in head turns, stops/starts, changes in gait speed without LOB.  Stairs            Wheelchair Mobility     Tilt Bed    Modified Rankin (Stroke Patients Only)       Balance Overall balance assessment:  Independent                                           Pertinent Vitals/Pain Pain Assessment Pain Assessment: No/denies pain    Home Living Family/patient expects to be discharged to:: Private residence Living Arrangements: Spouse/significant other;Children Available Help at Discharge: Family;Available 24 hours/day Type of Home: House Home Access: Stairs to enter Entrance Stairs-Rails: Right Entrance Stairs-Number of Steps: 4 Alternate Level Stairs-Number of Steps: 15 Home Layout: Two level        Prior Function Prior Level of Function : Independent/Modified Independent;Working/employed;Driving             Mobility Comments: independent       Extremity/Trunk Assessment   Upper Extremity Assessment Upper Extremity Assessment: Overall WFL for tasks assessed    Lower Extremity Assessment Lower Extremity Assessment: Overall WFL for tasks assessed       Communication   Communication Communication: No apparent difficulties  Cognition Arousal: Alert Behavior During Therapy: WFL for tasks assessed/performed Overall Cognitive Status: Within Functional Limits for tasks assessed  General Comments      Exercises     Assessment/Plan    PT Assessment Patient does not need any further PT services  PT Problem List         PT Treatment Interventions      PT Goals (Current goals can be found in the Care Plan section)  Acute Rehab PT Goals Patient Stated Goal: go home PT Goal Formulation: With patient Time For Goal Achievement: 12/26/22 Potential to Achieve Goals: Good    Frequency       Co-evaluation               AM-PAC PT "6 Clicks" Mobility  Outcome Measure Help needed turning from your back to your side while in a flat bed without using bedrails?: None Help needed moving from lying on your back to sitting on the side of a flat bed without using bedrails?: None Help  needed moving to and from a bed to a chair (including a wheelchair)?: None Help needed standing up from a chair using your arms (e.g., wheelchair or bedside chair)?: None Help needed to walk in hospital room?: None Help needed climbing 3-5 steps with a railing? : None 6 Click Score: 24    End of Session   Activity Tolerance: Patient tolerated treatment well Patient left: with call bell/phone within reach (sitting EOB in care of SLP)        Time: 1610-9604 PT Time Calculation (min) (ACUTE ONLY): 12 min   Charges:   PT Evaluation $PT Eval Low Complexity: 1 Low   PT General Charges $$ ACUTE PT VISIT: 1 Visit         Aleda Grana, PT, DPT 12/12/22, 12:50 PM   Sandi Mariscal 12/12/2022, 12:49 PM

## 2022-12-12 NOTE — Plan of Care (Signed)
Patient for tee in the am. Patient has no deficits since initial symptoms.

## 2022-12-12 NOTE — Evaluation (Signed)
Speech Language Pathology Evaluation Patient Details Name: Russell Thomas MRN: [id number] DOB: 11/06/1960 Today's Date: 12/12/2022 Time: 4098-1191 SLP Time Calculation (min) (ACUTE ONLY): 14 min  Problem List:  Patient Active Problem List   Diagnosis Date Noted   Obesity, Class III, BMI 40-49.9 (morbid obesity) (HCC) 12/12/2022   Acute ischemic stroke (HCC) 12/11/2022   Primary hypertension 10/16/2022   Neck pain 10/16/2022   Insomnia 10/16/2022   S/P laparoscopic hernia repair 07/18/2020   History of prostate cancer 02/14/2017   Abnormal SPEP 09/30/2015   Morbid obesity with BMI of 40.0-44.9, adult (HCC) 01/20/2014   Cholelithiasis 06/01/2013   Hx of lichen planus 11/14/2012   GERD (gastroesophageal reflux disease) 10/21/2012   Hyperlipidemia, mixed 06/08/2009   History of colonic polyps 06/08/2009   Obstructive sleep apnea 07/15/2007   Diverticulosis of colon 06/07/2007   Anxiety state 05/07/2007   Past Medical History:  Past Medical History:  Diagnosis Date   Adenomatous colon polyp 2008   Dr Arlyce Dice, GI   Allergic rhinitis    Anxiety    Diverticulosis    Gallbladder attack 2015   stones present   Gastritis    Hyperlipemia 2009   IBS (irritable bowel syndrome)    Internal hemorrhoids 2012   banded    Lichen planus    Pre-diabetes    Prostate cancer (HCC)    Seasonal allergies    Sleep apnea    [facility]   Past Surgical History:  Past Surgical History:  Procedure Laterality Date   BIOPSY PROSTATE  12/03/2016   COLONOSCOPY  04/10/2010, 08/12/2006   3/12 with banding; 2008: adenomatous polyp, diverticulosis   INCISIONAL HERNIA REPAIR N/A 07/18/2020   Procedure: LAPAROSCOPIC INCISIONAL HERNIA REPAIR WITH MESH;  Surgeon: Abigail Miyamoto, MD;  Location: [facility];  Service: General;  Laterality: N/A;   LYMPHADENECTOMY Bilateral 02/14/2017   Procedure: LYMPHADENECTOMY/PELVIC;  Surgeon: Heloise Purpura, MD;  Location: [facility];  Service: Urology;   Laterality: Bilateral;   ROBOT ASSISTED LAPAROSCOPIC RADICAL PROSTATECTOMY N/A 02/14/2017   Procedure: XI ROBOTIC ASSISTED LAPAROSCOPIC RADICAL PROSTATECTOMY LEVEL 3;  Surgeon: Heloise Purpura, MD;  Location: [facility];  Service: Urology;  Laterality: N/A;   SEPTOPLASTY     thumb surgery     TONSILLECTOMY     UPPER GASTROINTESTINAL ENDOSCOPY  10/24/2001   gastritis   UPPER GASTROINTESTINAL ENDOSCOPY  11/03/12   HPI:  Russell Thomas is a 62 yo male presenting to ED 11/5 with RLE weakness and short episode of aphasia. MRI Brain with two small acute infarcts in the posterior L frontal lobe. PMH includes OSA, HLD, HTN   Assessment / Plan / Recommendation Clinical Impression  Pt reports progressive history of mildly reduced processing speed, attention, and memory which he suspects is due to stress and lack of sleep rather than an acute change during this admission. He scored 28/30 on the SLUMS, which is considered WFL. No further skilled ST f/u is warranted at this time.    SLP Assessment  SLP Recommendation/Assessment: Patient does not need any further Speech Lanaguage Pathology Services SLP Visit Diagnosis: Cognitive communication deficit (R41.841)    Recommendations for follow up therapy are one component of a multi-disciplinary discharge planning process, led by the attending physician.  Recommendations may be updated based on patient status, additional functional criteria and insurance authorization.    Follow Up Recommendations  No SLP follow up    Assistance Recommended at Discharge  None  Functional Status Assessment Patient has not had a  recent decline in their functional status  Frequency and Duration           SLP Evaluation Cognition  Overall Cognitive Status: Within Functional Limits for tasks assessed       Comprehension  Auditory Comprehension Overall Auditory Comprehension: Appears within functional limits for tasks assessed    Expression Expression Primary Mode of  Expression: Verbal Verbal Expression Overall Verbal Expression: Appears within functional limits for tasks assessed Written Expression Dominant Hand: Right   Oral / Motor  Oral Motor/Sensory Function Overall Oral Motor/Sensory Function: Within functional limits Motor Speech Overall Motor Speech: Appears within functional limits for tasks assessed            Gwynneth Aliment, M.A., CF-SLP Speech Language Pathology, Acute Rehabilitation Services  Secure Chat preferred [phone]  12/12/2022, 1:07 PM

## 2022-12-12 NOTE — ED Notes (Signed)
ED TO INPATIENT HANDOFF REPORT  ED Nurse Name and Phone #:  Tomoko Sandra [phone]  S Name/Age/Gender Russell Thomas 62 y.o. male Room/Bed: 038C/038C  Code Status   Code Status: Full Code  Home/SNF/Other Home Patient oriented to: self, place, time, and situation Is this baseline? Yes   Triage Complete: Triage complete  Chief Complaint Episode of transient neurologic symptoms [R29.90]  Triage Note Pt sent by PCP fro possible TIA. PT states was speaking with a customer at 150 and started to sway side to side in chair and couldn't speak for 10 sec. Pt states started to walk down the hall and his right leg became very weak like it was dragging. Pt states had dull HA yesterday. Pt states felt better this morning.   Allergies Allergies  Allergen Reactions   Cephalosporins Hives and Rash   Ciprofloxacin Rash   Iodinated Contrast Media Hives, Itching and Rash    GADOLINIUM   Omeprazole Rash   Penicillins Itching, Rash and Other (See Comments)    Weezing, blisters Has patient had a PCN reaction causing immediate rash, facial/tongue/throat swelling, SOB or lightheadedness with hypotension: Yes Has patient had a PCN reaction causing severe rash involving mucus membranes or skin necrosis: No Has patient had a PCN reaction that required hospitalization: No Has patient had a PCN reaction occurring within the last 10 years: No If all of the above answers are "NO", then may proceed with Cephalosporin use.    Shrimp [Shellfish Allergy] Rash   Sulfa Antibiotics Hives and Rash   Sulfonamide Derivatives Rash   Omnipaque [Iohexol] Hives and Itching    Scratchy palms, scratchy throat, face and chest redness, hives all over. (CT fluoroscopic Contrast)      Level of Care/Admitting Diagnosis ED Disposition     ED Disposition  Admit   Condition  --   Comment  Hospital Area: [facility] [[id number]]  Level of Care: Telemetry Medical [104]  May place patient in observation at [facility] or [facility] if equivalent level of care is available:: No  Covid Evaluation: Asymptomatic - no recent exposure (last 10 days) testing not required  Diagnosis: Episode of transient neurologic symptoms [010932]  Admitting Physician: Briscoe Deutscher [[id number]]  Attending Physician: Briscoe Deutscher [[id number]]          B Medical/Surgery History Past Medical History:  Diagnosis Date   Adenomatous colon polyp 2008   Dr Arlyce Dice, GI   Allergic rhinitis    Anxiety    Diverticulosis    Gallbladder attack 2015   stones present   Gastritis    Hyperlipemia 2009   IBS (irritable bowel syndrome)    Internal hemorrhoids 2012   banded    Lichen planus    Pre-diabetes    Prostate cancer (HCC)    Seasonal allergies    Sleep apnea    [facility]   Past Surgical History:  Procedure Laterality Date   BIOPSY PROSTATE  12/03/2016   COLONOSCOPY  04/10/2010, 08/12/2006   3/12 with banding; 2008: adenomatous polyp, diverticulosis   INCISIONAL HERNIA REPAIR N/A 07/18/2020   Procedure: LAPAROSCOPIC INCISIONAL HERNIA REPAIR WITH MESH;  Surgeon: Abigail Miyamoto, MD;  Location: [facility];  Service: General;  Laterality: N/A;   LYMPHADENECTOMY Bilateral 02/14/2017   Procedure: LYMPHADENECTOMY/PELVIC;  Surgeon: Heloise Purpura, MD;  Location: [facility];  Service: Urology;  Laterality: Bilateral;   ROBOT ASSISTED LAPAROSCOPIC RADICAL PROSTATECTOMY N/A 02/14/2017   Procedure: XI ROBOTIC ASSISTED LAPAROSCOPIC RADICAL PROSTATECTOMY LEVEL 3;  Surgeon: Heloise Purpura, MD;  Location: [facility];  Service: Urology;  Laterality: N/A;   SEPTOPLASTY     thumb surgery     TONSILLECTOMY     UPPER GASTROINTESTINAL ENDOSCOPY  10/24/2001   gastritis   UPPER GASTROINTESTINAL ENDOSCOPY  11/03/12     A IV Location/Drains/Wounds Patient Lines/Drains/Airways Status     Active Line/Drains/Airways     Name Placement date Placement time Site Days   Peripheral IV 07/18/20 18 G 1.25" Left Hand 07/18/20  0550   Hand  877   Peripheral IV 12/11/22 20 G Anterior;Distal;Right;Upper Arm 12/11/22  1959  Arm  1   Incision - 3 Ports Abdomen Left;Upper Left;Mid Left;Lower 07/18/20  0755  -- 877            Intake/Output Last 24 hours  Intake/Output Summary (Last 24 hours) at 12/12/2022 1011 Last data filed at 12/11/2022 2050 Gross per 24 hour  Intake 480 ml  Output --  Net 480 ml    Labs/Imaging Results for orders placed or performed during the hospital encounter of 12/11/22 (from the past 48 hour(s))  Protime-INR     Status: None   Collection Time: 12/11/22  2:03 PM  Result Value Ref Range   Prothrombin Time 13.1 11.4 - 15.2 seconds   INR 1.0 0.8 - 1.2    Comment: (NOTE) INR goal varies based on device and disease states. Performed at [facility], [address] [place], [place] [address]   APTT     Status: None   Collection Time: 12/11/22  2:03 PM  Result Value Ref Range   aPTT 27 24 - 36 seconds    Comment: Performed at [facility], [address], [place], [place] [address]  CBC     Status: None   Collection Time: 12/11/22  2:03 PM  Result Value Ref Range   WBC 9.7 4.0 - 10.5 K/uL   RBC 5.17 4.22 - 5.81 MIL/uL   Hemoglobin 15.9 13.0 - 17.0 g/dL   HCT 09.8 11.9 - 14.7 %   MCV 89.7 80.0 - 100.0 fL   MCH 30.8 26.0 - 34.0 pg   MCHC 34.3 30.0 - 36.0 g/dL   RDW 82.9 56.2 - 13.0 %   Platelets 222 150 - 400 K/uL   nRBC 0.0 0.0 - 0.2 %    Comment: Performed at [facility], [address] [place], [place] [address]  Differential     Status: None   Collection Time: 12/11/22  2:03 PM  Result Value Ref Range   Neutrophils Relative % 69 %   Neutro Abs 6.6 1.7 - 7.7 K/uL   Lymphocytes Relative 24 %   Lymphs Abs 2.4 0.7 - 4.0 K/uL   Monocytes Relative 7 %   Monocytes Absolute 0.7 0.1 - 1.0 K/uL   Eosinophils Relative 0 %   Eosinophils Absolute 0.0 0.0 - 0.5 K/uL   Basophils Relative 0 %   Basophils Absolute 0.0 0.0 - 0.1 K/uL   Immature Granulocytes 0 %   Abs  Immature Granulocytes 0.03 0.00 - 0.07 K/uL    Comment: Performed at [facility], [address] [place], [place] [address]  Comprehensive metabolic panel     Status: None   Collection Time: 12/11/22  2:03 PM  Result Value Ref Range   Sodium 140 135 - 145 mmol/L   Potassium 4.1 3.5 - 5.1 mmol/L   Chloride 107 98 - 111 mmol/L   CO2  22 22 - 32 mmol/L   Glucose, Bld 92 70 - 99 mg/dL    Comment: Glucose reference range applies only to samples taken after fasting for at least 8 hours.   BUN 13 8 - 23 mg/dL   Creatinine, Ser 1.61 0.61 - 1.24 mg/dL   Calcium 9.0 8.9 - 09.6 mg/dL   Total Protein 6.9 6.5 - 8.1 g/dL   Albumin 4.1 3.5 - 5.0 g/dL   AST 21 15 - 41 U/L   ALT 31 0 - 44 U/L   Alkaline Phosphatase 60 38 - 126 U/L   Total Bilirubin 0.7 <1.2 mg/dL   GFR, Estimated >04 >54 mL/min    Comment: (NOTE) Calculated using the CKD-EPI Creatinine Equation (2021)    Anion gap 11 5 - 15    Comment: Performed at [facility], [address] [place], [place] [address]  Ethanol     Status: None   Collection Time: 12/11/22  2:03 PM  Result Value Ref Range   Alcohol, Ethyl (B) <10 <10 mg/dL    Comment: (NOTE) Lowest detectable limit for serum alcohol is 10 mg/dL.  For medical purposes only. Performed at [facility], [address], [place], [place] [address]   I-stat chem 8, ED     Status: Abnormal   Collection Time: 12/11/22  2:12 PM  Result Value Ref Range   Sodium 139 135 - 145 mmol/L   Potassium 4.0 3.5 - 5.1 mmol/L   Chloride 106 98 - 111 mmol/L   BUN 16 8 - 23 mg/dL   Creatinine, Ser 2.95 0.61 - 1.24 mg/dL   Glucose, Bld 88 70 - 99 mg/dL    Comment: Glucose reference range applies only to samples taken after fasting for at least 8 hours.   Calcium, Ion 1.04 (L) 1.15 - 1.40 mmol/L   TCO2 21 (L) 22 - 32 mmol/L   Hemoglobin 16.0 13.0 - 17.0 g/dL   HCT 62.1 30.8 - 65.7 %  HIV Antibody (routine testing w rflx)     Status: None   Collection Time: 12/12/22  1:56  AM  Result Value Ref Range   HIV Screen 4th Generation wRfx Non Reactive Non Reactive    Comment: Performed at [facility], [address] [place], [place] [address]  Lipid panel     Status: Abnormal   Collection Time: 12/12/22  1:56 AM  Result Value Ref Range   Cholesterol 174 0 - 200 mg/dL   Triglycerides 295 (H) <150 mg/dL   HDL 44 >28 mg/dL   Total CHOL/HDL Ratio 4.0 RATIO   VLDL 30 0 - 40 mg/dL   LDL Cholesterol 413 (H) 0 - 99 mg/dL    Comment:        Total Cholesterol/HDL:CHD Risk Coronary Heart Disease Risk Table                     Men   Women  1/2 Average Risk   3.4   3.3  Average Risk       5.0   4.4  2 X Average Risk   9.6   7.1  3 X Average Risk  23.4   11.0        Use the calculated Patient Ratio above and the CHD Risk Table to determine the patient's CHD Risk.        ATP III CLASSIFICATION (LDL):  <100     mg/dL   Optimal  244-010  mg/dL   Near or Above                    Optimal  130-159  mg/dL   Borderline  409-811  mg/dL   High  >914     mg/dL   Very High Performed at [facility], [address] [place], [place] [address]   CBC     Status: None   Collection Time: 12/12/22  1:56 AM  Result Value Ref Range   WBC 9.4 4.0 - 10.5 K/uL   RBC 4.86 4.22 - 5.81 MIL/uL   Hemoglobin 15.0 13.0 - 17.0 g/dL   HCT 62.1 30.8 - 65.7 %   MCV 91.6 80.0 - 100.0 fL   MCH 30.9 26.0 - 34.0 pg   MCHC 33.7 30.0 - 36.0 g/dL   RDW 84.6 96.2 - 95.2 %   Platelets 209 150 - 400 K/uL   nRBC 0.0 0.0 - 0.2 %    Comment: Performed at [facility], [address], [place], [place] [address]  Hemoglobin A1c     Status: Abnormal   Collection Time: 12/12/22  1:56 AM  Result Value Ref Range   Hgb A1c MFr Bld 5.8 (H) 4.8 - 5.6 %    Comment: (NOTE) Pre diabetes:          5.7%-6.4%  Diabetes:              >6.4%  Glycemic control for   <7.0% adults with diabetes    Mean Plasma Glucose 119.76 mg/dL    Comment: Performed at [facility], [address] [place], [place] [address]  Basic metabolic panel     Status: Abnormal   Collection Time: 12/12/22  1:56 AM  Result Value Ref Range   Sodium 137 135 - 145 mmol/L   Potassium 4.1 3.5 - 5.1 mmol/L   Chloride 104 98 - 111 mmol/L   CO2 22 22 - 32 mmol/L   Glucose, Bld 129 (H) 70 - 99 mg/dL    Comment: Glucose reference range applies only to samples taken after fasting for at least 8 hours.   BUN 14 8 - 23 mg/dL   Creatinine, Ser 2.72 0.61 - 1.24 mg/dL   Calcium 8.9 8.9 - 53.6 mg/dL   GFR, Estimated >64 >40 mL/min    Comment: (NOTE) Calculated using the CKD-EPI Creatinine Equation (2021)    Anion gap 11 5 - 15    Comment: Performed at [facility], [address], [place], [place] [address]   MR BRAIN WO CONTRAST  Result Date: 12/11/2022 CLINICAL DATA:  Acute  neurologic deficit EXAM: MRI HEAD WITHOUT CONTRAST MRA HEAD WITHOUT CONTRAST TECHNIQUE: Multiplanar, multi-echo pulse sequences of the brain and surrounding structures were acquired without intravenous contrast. Angiographic images of the Circle of Willis were acquired using MRA technique without intravenous contrast. COMPARISON:  None Available. FINDINGS: MRI HEAD FINDINGS Brain: There are 2 small foci of abnormal diffusion restriction within the posterior left frontal lobe. No acute or chronic hemorrhage. Minimal multifocal hyperintense T2-weight signal within the white matter. A partially empty sella turcica is incidentally noted. Vascular: Normal flow voids. Skull and upper cervical spine: Normal calvarium and skull base. Visualized upper cervical spine and soft tissues are normal. Sinuses/Orbits:No paranasal sinus fluid levels or advanced mucosal thickening. No mastoid or middle ear effusion. Normal orbits. MRA HEAD FINDINGS POSTERIOR CIRCULATION: --Vertebral arteries: Normal --Inferior cerebellar arteries: Normal. --Basilar artery: Normal. --Superior cerebellar  arteries: Normal. --Posterior cerebral arteries: Normal. ANTERIOR  CIRCULATION: --Intracranial internal carotid arteries: Normal. --Anterior cerebral arteries (ACA): Normal. --Middle cerebral arteries (MCA): Normal. IMPRESSION: 1. Two small acute infarcts within the posterior left frontal lobe. No hemorrhage or mass effect. 2. Normal intracranial MRA. Electronically Signed   By: Deatra Robinson M.D.   On: 12/11/2022 23:51   MR ANGIO HEAD WO CONTRAST  Result Date: 12/11/2022 CLINICAL DATA:  Acute  neurologic deficit EXAM: MRI HEAD WITHOUT CONTRAST MRA HEAD WITHOUT CONTRAST TECHNIQUE: Multiplanar, multi-echo pulse sequences of the brain and surrounding structures were acquired without intravenous contrast. Angiographic images of the Circle of Willis were acquired using MRA technique without intravenous contrast. COMPARISON:  None Available. FINDINGS: MRI HEAD FINDINGS Brain: There are 2 small foci of abnormal diffusion restriction within the posterior left frontal lobe. No acute or chronic hemorrhage. Minimal multifocal hyperintense T2-weight signal within the white matter. A partially empty sella turcica is incidentally noted. Vascular: Normal flow voids. Skull and upper cervical spine: Normal calvarium and skull base. Visualized upper cervical spine and soft tissues are normal. Sinuses/Orbits:No paranasal sinus fluid levels or advanced mucosal thickening. No mastoid or middle ear effusion. Normal orbits. MRA HEAD FINDINGS POSTERIOR CIRCULATION: --Vertebral arteries: Normal --Inferior cerebellar arteries: Normal. --Basilar artery: Normal. --Superior cerebellar arteries: Normal. --Posterior cerebral arteries: Normal. ANTERIOR CIRCULATION: --Intracranial internal carotid arteries: Normal. --Anterior cerebral arteries (ACA): Normal. --Middle cerebral arteries (MCA): Normal. IMPRESSION: 1. Two small acute infarcts within the posterior left frontal lobe. No hemorrhage or mass effect. 2. Normal intracranial MRA. Electronically Signed   By: Deatra Robinson M.D.   On: 12/11/2022 23:51    CT HEAD WO CONTRAST  Result Date: 12/11/2022 CLINICAL DATA:  Neuro deficit, acute, stroke suspected EXAM: CT HEAD WITHOUT CONTRAST TECHNIQUE: Contiguous axial images were obtained from the base of the skull through the vertex without intravenous contrast. RADIATION DOSE REDUCTION: This exam was performed according to the departmental dose-optimization program which includes automated exposure control, adjustment of the mA and/or kV according to patient size and/or use of iterative reconstruction technique. COMPARISON:  CT Head 02/06/05 FINDINGS: Brain: No hemorrhage. No hydrocephalus. No extra-axial fluid collection. No CT evidence of an acute cortical infarct. No mass effect. No mass lesion. Mega cisterna magna. Enlarged and partially empty sella. Vascular: No hyperdense vessel or unexpected calcification. Skull: Normal. Negative for fracture or focal lesion. Sinuses/Orbits: No middle ear or mastoid effusion. Paranasal sinuses are clear. Orbits are unremarkable. Other: None. IMPRESSION: No hemorrhage or CT evidence of an acute cortical infarct. Electronically Signed   By: Lorenza Cambridge M.D.   On: 12/11/2022 16:25    Pending Labs Unresulted Labs (From admission, onward)     Start     Ordered   12/18/22 0500  Creatinine, serum  (enoxaparin (LOVENOX)    CrCl >/= 30 ml/min)  Weekly,   R     Comments: while on enoxaparin therapy    12/11/22 2051   12/12/22 0500  CBC  (Labs)  Daily,   R      12/11/22 2051   12/12/22 0500  Basic metabolic panel  Daily,   R      12/11/22 2051            Vitals/Pain Today's Vitals   12/12/22 0151 12/12/22 0204 12/12/22 0430 12/12/22 0616  BP: 125/64     Pulse: 88     Resp: (!) 21     Temp: 97.8 F (36.6 C)     TempSrc: Oral  SpO2: 96%     Weight:      Height:      PainSc:  0-No pain Asleep Asleep    Isolation Precautions No active isolations  Medications Medications  sodium chloride flush (NS) 0.9 % injection 3 mL (3 mLs Intravenous Not Given  12/11/22 1907)   stroke: early stages of recovery book (has no administration in time range)  acetaminophen (TYLENOL) tablet 650 mg (has no administration in time range)    Or  acetaminophen (TYLENOL) 160 MG/5ML solution 650 mg (has no administration in time range)    Or  acetaminophen (TYLENOL) suppository 650 mg (has no administration in time range)  senna-docusate (Senokot-S) tablet 1 tablet (has no administration in time range)  enoxaparin (LOVENOX) injection 40 mg (40 mg Subcutaneous Given 12/12/22 0156)  LORazepam (ATIVAN) tablet 1 mg (has no administration in time range)  aspirin EC tablet 81 mg (has no administration in time range)  clopidogrel (PLAVIX) tablet 75 mg (has no administration in time range)  rosuvastatin (CRESTOR) tablet 20 mg (has no administration in time range)  LORazepam (ATIVAN) injection 2 mg (2 mg Intravenous Given 12/11/22 2141)    Mobility walks     Focused Assessments Neuro Assessment Handoff:  Swallow screen pass? Yes    NIH Stroke Scale  Dizziness Present: No Headache Present: No Interval: Shift assessment Level of Consciousness (1a.)   : Alert, keenly responsive LOC Questions (1b. )   : Answers both questions correctly LOC Commands (1c. )   : Performs both tasks correctly Best Gaze (2. )  : Normal Visual (3. )  : No visual loss Facial Palsy (4. )    : Normal symmetrical movements Motor Arm, Left (5a. )   : No drift Motor Arm, Right (5b. ) : No drift Motor Leg, Left (6a. )  : No drift Motor Leg, Right (6b. ) : No drift Limb Ataxia (7. ): Absent Sensory (8. )  : Normal, no sensory loss Best Language (9. )  : No aphasia Dysarthria (10. ): Normal Extinction/Inattention (11.)   : No Abnormality Complete NIHSS TOTAL: 0     Neuro Assessment:   Neuro Checks:   Shift assessment (12/12/22 0051)  Has TPA been given? No If patient is a Neuro Trauma and patient is going to OR before floor call report to 4N Charge nurse: [phone] or  [phone]   R Recommendations: See Admitting Provider Note  Report given to:   Additional Notes:

## 2022-12-12 NOTE — Plan of Care (Signed)
 No deficits at this time.

## 2022-12-12 NOTE — Progress Notes (Signed)
   12/12/22 1240  TOC Brief Assessment  Insurance and Status Reviewed Systems developer)  Patient has primary care physician Yes (Link Snuffer, Scott, MD)  Home environment has been reviewed From home with Spouse  Prior level of function: independent  Social Determinants of Health Reivew SDOH reviewed no interventions necessary  Readmission risk has been reviewed Yes (N/A listed)  Transition of care needs no transition of care needs at this time   No TOC need identified at this time  Patient has insurance thru his employer [employer] will continue to follow patient for any additional discharge needs     Media Information   Document Information  Insurance Card  allied 2024  Attached on: 12/11/2022 13:23  Received on: 12/11/2022 13:23  Source Information  Aldine Contes  [facility]  Document History

## 2022-12-12 NOTE — Progress Notes (Signed)
    [facility] has been requested to perform a transesophageal echocardiogram on Russell Thomas after he was found with left fontal lobe ischemic infarction.  After careful review of history and examination, the risks and benefits of transesophageal echocardiogram have been explained including risks of esophageal damage, perforation (1:10,000 risk), bleeding, pharyngeal hematoma as well as other potential complications associated with conscious sedation including aspiration, arrhythmia, respiratory failure and death. Alternatives to treatment were discussed, questions were answered. Patient is willing to proceed.   Perlie Gold PA-C 12/12/2022 4:52 PM

## 2022-12-12 NOTE — Hospital Course (Addendum)
62 year old male PMH including hypertension, hyperlipidemia OSA, presented with transient aphasia and right leg weakness.  Imaging revealed 2 small acute cortical infarcts in the patient was admitted for further evaluation.  Consultants Neurology   Procedures None

## 2022-12-12 NOTE — Progress Notes (Signed)
  Echocardiogram 2D Echocardiogram has been performed.  Reinaldo Raddle Sahithi Ordoyne 12/12/2022, 1:46 PM

## 2022-12-12 NOTE — Evaluation (Signed)
Occupational Therapy Evaluation and Discharge Summary Patient Details Name: Russell Thomas MRN: [id number] DOB: [birth year] Today's Date: 12/12/2022   History of Present Illness Pt is a 62 yo male admitted with short episode of aphasia followed by R leg weakness. Pt found to have 2 small acute infarts on the Post L frontal lobe. PMH: OSA, HLD, HTN.   Clinical Impression   Pt admitted with the above diagnosis and seems to have returned to functional baseline. Pt is completing all adls in room and walking in hallway at baseline.  Educated pt on BEFAST for stroke prevention.  Pt lives with wife who can be around some and pt also works full time.  Son is home to take care of his mother who has breast CA.  Feel pt is safe to d/c home. D/C OT.       If plan is discharge home, recommend the following: Other (comment) (none)    Functional Status Assessment  Patient has not had a recent decline in their functional status  Equipment Recommendations  None recommended by OT    Recommendations for Other Services       Precautions / Restrictions Precautions Precautions: None Restrictions Weight Bearing Restrictions: No      Mobility Bed Mobility Overal bed mobility: Modified Independent                  Transfers Overall transfer level: Independent Equipment used: None                      Balance Overall balance assessment: Independent                                         ADL either performed or assessed with clinical judgement   ADL Overall ADL's : Independent                                       General ADL Comments: no assist needed     Vision Baseline Vision/History: 1 Wears glasses Ability to See in Adequate Light: 0 Adequate Patient Visual Report: No change from baseline Vision Assessment?: No apparent visual deficits     Perception Perception: Within Functional Limits       Praxis Praxis: WFL        Pertinent Vitals/Pain Pain Assessment Pain Assessment: No/denies pain     Extremity/Trunk Assessment Upper Extremity Assessment Upper Extremity Assessment: Overall WFL for tasks assessed   Lower Extremity Assessment Lower Extremity Assessment: Overall WFL for tasks assessed   Cervical / Trunk Assessment Cervical / Trunk Assessment: Normal   Communication Communication Communication: No apparent difficulties   Cognition Arousal: Alert Behavior During Therapy: WFL for tasks assessed/performed Overall Cognitive Status: Within Functional Limits for tasks assessed                                       General Comments  Pt  required no assist with mobility and had no LOB.    Exercises     Shoulder Instructions      Home Living Family/patient expects to be discharged to:: Private residence Living Arrangements: Spouse/significant other;Children Available Help at Discharge: Family;Available 24 hours/day (soln home to help take care of  mom with cancer.) Type of Home: House Home Access: Stairs to enter [employer] of Steps: 4 Entrance Stairs-Rails: Right Home Layout: Two level Alternate Level Stairs-Number of Steps: 15 Alternate Level Stairs-Rails: Right Bathroom Shower/Tub: Producer, television/film/video: Handicapped height         Additional Comments: wife has w/c, walker and shower seat but she uses all of this herself.      Prior Functioning/Environment Prior Level of Function : Independent/Modified Independent;Working/employed;Driving             Mobility Comments: independent ADLs Comments: independent        OT Problem List:        OT Treatment/Interventions:      OT Goals(Current goals can be found in the care plan section) Acute Rehab OT Goals Patient Stated Goal: to get healthy OT Goal Formulation: All assessment and education complete, DC therapy  OT Frequency:      Co-evaluation              AM-PAC OT  "6 Clicks" Daily Activity     Outcome Measure Help from another person eating meals?: None Help from another person taking care of personal grooming?: None Help from another person toileting, which includes using toliet, bedpan, or urinal?: None Help from another person bathing (including washing, rinsing, drying)?: None Help from another person to put on and taking off regular upper body clothing?: None Help from another person to put on and taking off regular lower body clothing?: None 6 Click Score: 24   End of Session Nurse Communication: Mobility status  Activity Tolerance: Patient tolerated treatment well Patient left: in bed;with call bell/phone within reach  OT Visit Diagnosis: Unsteadiness on feet (R26.81)                Time: 4696-2952 OT Time Calculation (min): 28 min Charges:  OT General Charges $OT Visit: 1 Visit OT Evaluation $OT Eval Low Complexity: 1 Low OT Treatments $Self Care/Home Management : 8-22 mins  Hope Budds 12/12/2022, 8:56 AM

## 2022-12-13 ENCOUNTER — Observation Stay (HOSPITAL_COMMUNITY): Payer: PRIVATE HEALTH INSURANCE

## 2022-12-13 ENCOUNTER — Encounter (HOSPITAL_COMMUNITY)
Admission: EM | Disposition: A | Discharge status: Home / Self Care | Payer: Self-pay | Source: Home / Self Care | Attending: Emergency Medicine

## 2022-12-13 ENCOUNTER — Observation Stay (HOSPITAL_COMMUNITY): Payer: PRIVATE HEALTH INSURANCE | Admitting: Anesthesiology

## 2022-12-13 ENCOUNTER — Other Ambulatory Visit: Payer: Self-pay | Admitting: Cardiology

## 2022-12-13 DIAGNOSIS — I10 Essential (primary) hypertension: Secondary | ICD-10-CM

## 2022-12-13 DIAGNOSIS — I639 Cerebral infarction, unspecified: Secondary | ICD-10-CM

## 2022-12-13 DIAGNOSIS — F411 Generalized anxiety disorder: Secondary | ICD-10-CM | POA: Diagnosis not present

## 2022-12-13 DIAGNOSIS — I351 Nonrheumatic aortic (valve) insufficiency: Secondary | ICD-10-CM

## 2022-12-13 DIAGNOSIS — G4733 Obstructive sleep apnea (adult) (pediatric): Secondary | ICD-10-CM | POA: Diagnosis not present

## 2022-12-13 HISTORY — PX: TRANSESOPHAGEAL ECHOCARDIOGRAM (CATH LAB): EP1270

## 2022-12-13 LAB — CBC
HCT: 43.5 % (ref 39.0–52.0)
Hemoglobin: 14.8 g/dL (ref 13.0–17.0)
MCH: 30.7 pg (ref 26.0–34.0)
MCHC: 34 g/dL (ref 30.0–36.0)
MCV: 90.2 fL (ref 80.0–100.0)
Platelets: 195 10*3/uL (ref 150–400)
RBC: 4.82 MIL/uL (ref 4.22–5.81)
RDW: 12.9 % (ref 11.5–15.5)
WBC: 7.3 10*3/uL (ref 4.0–10.5)
nRBC: 0 % (ref 0.0–0.2)

## 2022-12-13 LAB — BASIC METABOLIC PANEL
Anion gap: 11 (ref 5–15)
BUN: 12 mg/dL (ref 8–23)
CO2: 24 mmol/L (ref 22–32)
Calcium: 9.3 mg/dL (ref 8.9–10.3)
Chloride: 103 mmol/L (ref 98–111)
Creatinine, Ser: 1.01 mg/dL (ref 0.61–1.24)
GFR, Estimated: >60 mL/min (ref >60)
Glucose, Bld: 119 mg/dL — ABNORMAL HIGH (ref 70–99)
Potassium: 5.2 mmol/L — ABNORMAL HIGH (ref 3.5–5.1)
Sodium: 138 mmol/L (ref 135–145)

## 2022-12-13 LAB — ECHO TEE

## 2022-12-13 SURGERY — TRANSESOPHAGEAL ECHOCARDIOGRAM (TEE) (CATHLAB)
Anesthesia: Monitor Anesthesia Care

## 2022-12-13 MED ORDER — LIDOCAINE HCL 4 % EX SOLN
CUTANEOUS | Status: DC | PRN
  Administered 2022-12-13: 3 mL via TOPICAL

## 2022-12-13 MED ORDER — ROSUVASTATIN CALCIUM 20 MG PO TABS: 20.0000 mg | ORAL_TABLET | Freq: Every day | ORAL | 2 refills | Status: AC

## 2022-12-13 MED ORDER — SODIUM CHLORIDE 0.9 % IV SOLN: INTRAVENOUS | Status: DC | PRN

## 2022-12-13 MED ORDER — PROPOFOL 500 MG/50ML IV EMUL
INTRAVENOUS | Status: DC | PRN
  Administered 2022-12-13: 150 ug/kg/min via INTRAVENOUS

## 2022-12-13 MED ORDER — ASPIRIN 81 MG PO TBEC: 81.0000 mg | DELAYED_RELEASE_TABLET | Freq: Every day | ORAL | 12 refills | Status: AC

## 2022-12-13 MED ORDER — CLOPIDOGREL BISULFATE 75 MG PO TABS: 75.0000 mg | ORAL_TABLET | Freq: Every day | ORAL | 0 refills | Status: DC

## 2022-12-13 MED ORDER — SODIUM ZIRCONIUM CYCLOSILICATE 5 G PO PACK
5.0000 g | PACK | Freq: Once | ORAL | Status: AC
  Administered 2022-12-13: 5 g via ORAL
  Filled 2022-12-13: qty 1

## 2022-12-13 MED ORDER — PROPOFOL 10 MG/ML IV BOLUS
INTRAVENOUS | Status: DC | PRN
  Administered 2022-12-13: 100 mg via INTRAVENOUS

## 2022-12-13 MED ORDER — GLYCOPYRROLATE 0.2 MG/ML IJ SOLN
INTRAMUSCULAR | Status: DC | PRN
  Administered 2022-12-13: .2 mg via INTRAVENOUS

## 2022-12-13 MED ORDER — LIDOCAINE 2% (20 MG/ML) 5 ML SYRINGE
INTRAMUSCULAR | Status: DC | PRN
  Administered 2022-12-13: 60 mg via INTRAVENOUS

## 2022-12-13 NOTE — Transfer of Care (Signed)
Immediate Anesthesia Transfer of Care Note  Patient: Russell Thomas  Procedure(s) Performed: TRANSESOPHAGEAL ECHOCARDIOGRAM  Patient Location: PACU  Anesthesia Type:MAC  Level of Consciousness: awake and drowsy  Airway & Oxygen Therapy: Patient Spontanous Breathing  Post-op Assessment: Report given to RN  Post vital signs: Reviewed and stable  Last Vitals:  Vitals Value Taken Time  BP    Temp    Pulse    Resp    SpO2      Last Pain:  Vitals:   12/13/22 1016  TempSrc: Temporal  PainSc:          Complications: No notable events documented.

## 2022-12-13 NOTE — Progress Notes (Signed)
Initial Nutrition Assessment  DOCUMENTATION CODES:   Morbid obesity  INTERVENTION:  Adnvance diet as medically applicable  Ensure Plus High Protein po BID, each supplement provides 350 kcal and 20 grams of protein. When diet is advanced,   NUTRITION DIAGNOSIS:   Inadequate oral intake related to acute illness as evidenced by estimated needs.    GOAL:   Patient will meet greater than or equal to 90% of their needs    MONITOR:   Diet advancement, Weight trends, Skin  REASON FOR ASSESSMENT:   Consult Assessment of nutrition requirement/status, Diet education  ASSESSMENT:  62 y.o. M, admitted from home with acute ischemic stroke. [facility]; HTN,HLD, OSA on CPAP, anxiety, insomnia, gastritis, diverticulosis, anxiety, prostate cancer.  Admit weight: 142.4 kg Current weight: 142.4 kg  Weight history: 12/11/22 (!) 142.4 kg  10/16/22 (!) 142.4 kg  07/18/20 133.5 kg  07/13/20 133.5 kg    Average Meal Intake: 100% intake x 2 recorded meals  Nutritionally Relevant Medications: Scheduled Meds:  clopidogrel  75 mg Oral Daily    Labs Reviewed:  CBG ranges from 129-119 mg/dL over the last 24 hours HgbA1c 5.8(12/12/2022)    NUTRITION - FOCUSED PHYSICAL EXAM:  Flowsheet Row Most Recent Value  Orbital Region No depletion  Upper Arm Region No depletion  Thoracic and Lumbar Region No depletion  Buccal Region No depletion  Temple Region No depletion  Clavicle Bone Region No depletion  Clavicle and Acromion Bone Region No depletion  Scapular Bone Region No depletion  Dorsal Hand No depletion  Patellar Region No depletion  Anterior Thigh Region No depletion  Posterior Calf Region No depletion  Edema (RD Assessment) None  Hair Reviewed  Eyes Reviewed  Mouth Reviewed  Skin Reviewed  Nails Reviewed       Diet Order:   Diet Order             Diet NPO time specified  Diet effective midnight                   EDUCATION NEEDS:   Education needs have been  addressed  Skin:  Skin Assessment: Reviewed RN Assessment  Last BM:  12/11/22  Height:   Ht Readings from Last 1 Encounters:  12/11/22 6\' 1"  (1.854 m)    Weight:   Wt Readings from Last 1 Encounters:  12/11/22 (!) 142.4 kg    Ideal Body Weight:     BMI:  Body mass index is 41.42 kg/m.  Estimated Nutritional Needs:   Kcal:  2500-2900 kcal/day  Protein:  110-125 g/d  Fluid:  51ml/kcal    Jamelle Haring RDN, LDN Clinical Dietitian  RDN pager # available on Amion

## 2022-12-13 NOTE — Discharge Summary (Addendum)
PATIENT DETAILS Name: Russell Thomas Age: 62 y.o. Sex: male Date of Birth: 09-10-1960 MRN: [id number]. Admitting Physician: Briscoe Deutscher, MD WUJ:[id number], Lorin Picket, MD  Admit Date: 12/11/2022 Discharge date: 12/13/2022  Recommendations for Outpatient Follow-up:  Follow up with PCP in 1-2 weeks Please obtain CMP/CBC in one week Please ensure follow-up with stroke clinic  Admitted From:  Home  Disposition: Home   Discharge Condition: good  CODE STATUS:   Code Status: Full Code   Diet recommendation:  Diet Order             Diet NPO time specified  Diet effective midnight           Diet - low sodium heart healthy                    Brief Summary: 62 year old male PMH including hypertension, hyperlipidemia OSA, presented with transient aphasia and right leg weakness.  Imaging revealed 2 small acute cortical infarcts in the patient was admitted for further evaluation.  Consultants Neurology   Procedures None   Significant studies 11/5 >>MRI brain: 2 small acute infarcts-left frontal lobe. 11/5>> MRA brain: No significant stenosis 11/6>> carotid Doppler: No significant stenosis 11/6>> A1c: 5.8 11/6>> LDL: 100 11/6>> TTE: EF 60-65% 11/7>> TEE: No thrombus  Brief Hospital Course: Acute ischemic CVA Aphasia/right-sided weakness resolved-back to baseline Workup as above Discussed with Dr. Leory Plowman x 3 weeks followed by aspirin alone Continue statin on discharge Epic message sent to cardiology-30-day outpatient monitor to be arranged Please ensure follow-up with neurology postdischarge  Addendum TEE also showed a small PFO-d/w Dr Pearlean Brownie 11/8-he will arrange for outpatient Lower ext doppler and TCD bubble study.   HTN Initially permissive hypertension allowed Resume losartan on discharge  Anxiety disorder Stable As needed lorazepam  OSA  CPAP nightly  Morbid Obesity: Estimated body mass index is 41.42 kg/m as calculated from the  following:   Height as of this encounter: 6\' 1"  (1.854 m).   Weight as of this encounter: 142.4 kg.   Discharge Diagnoses:  Principal Problem:   Acute ischemic stroke [facility]) Active Problems:   Anxiety state   Obstructive sleep apnea   Primary hypertension   Insomnia   Obesity, Class III, BMI 40-49.9 (morbid obesity) (HCC)   Discharge Instructions:  Activity:  As tolerated   Discharge Instructions     Ambulatory referral to Neurology   Complete by: As directed    An appointment is requested in approximately: 4 weeks   Call MD for:  extreme fatigue   Complete by: As directed    Call MD for:  persistant dizziness or light-headedness   Complete by: As directed    Diet - low sodium heart healthy   Complete by: As directed    Discharge instructions   Complete by: As directed    Follow with Primary MD  Alysia Penna, MD in 1-2 weeks  Follow-up with the stroke clinic-the office will give you a call with a follow-up appointment, if you do not hear from them-please give them a call  Take aspirin/Plavix x 3 weeks-after 3 weeks stop Plavix and continue on aspirin  Please get a complete blood count and chemistry panel checked by your Primary MD at your next visit, and again as instructed by your Primary MD.  Get Medicines reviewed and adjusted: Please take all your medications with you for your next visit with your Primary MD  Laboratory/radiological data: Please request your Primary MD to go over all  hospital tests and procedure/radiological results at the follow up, please ask your Primary MD to get all Hospital records sent to his/her office.  In some cases, they will be blood work, cultures and biopsy results pending at the time of your discharge. Please request that your primary care M.D. follows up on these results.  Also Note the following: If you experience worsening of your admission symptoms, develop shortness of breath, life threatening emergency, suicidal or  homicidal thoughts you must seek medical attention immediately by calling 911 or calling your MD immediately  if symptoms less severe.  You must read complete instructions/literature along with all the possible adverse reactions/side effects for all the Medicines you take and that have been prescribed to you. Take any new Medicines after you have completely understood and accpet all the possible adverse reactions/side effects.   Do not drive when taking Pain medications or sleeping medications (Benzodaizepines)  Do not take more than prescribed Pain, Sleep and Anxiety Medications. It is not advisable to combine anxiety,sleep and pain medications without talking with your primary care practitioner  Special Instructions: If you have smoked or chewed Tobacco  in the last 2 yrs please stop smoking, stop any regular Alcohol  and or any Recreational drug use.  Wear Seat belts while driving.  Please note: You were cared for by a hospitalist during your hospital stay. Once you are discharged, your primary care physician will handle any further medical issues. Please note that NO REFILLS for any discharge medications will be authorized once you are discharged, as it is imperative that you return to your primary care physician (or establish a relationship with a primary care physician if you do not have one) for your post hospital discharge needs so that they can reassess your need for medications and monitor your lab values.   Increase activity slowly   Complete by: As directed       Allergies as of 12/13/2022       Reactions   Cephalosporins Hives, Rash   Ciprofloxacin Rash   Iodinated Contrast Media Hives, Itching, Rash   GADOLINIUM   Omeprazole Rash   Penicillins Itching, Rash, Other (See Comments)   Weezing, blisters Has patient had a PCN reaction causing immediate rash, facial/tongue/throat swelling, SOB or lightheadedness with hypotension: Yes Has patient had a PCN reaction causing severe  rash involving mucus membranes or skin necrosis: No Has patient had a PCN reaction that required hospitalization: No Has patient had a PCN reaction occurring within the last 10 years: No If all of the above answers are "NO", then may proceed with Cephalosporin use.   Shrimp [shellfish Allergy] Rash   Sulfa Antibiotics Hives, Rash   Sulfonamide Derivatives Rash   Omnipaque [iohexol] Hives, Itching   Scratchy palms, scratchy throat, face and chest redness, hives all over. (CT fluoroscopic Contrast)          Medication List     TAKE these medications    acetaminophen 650 MG CR tablet Commonly known as: TYLENOL Take 650 mg by mouth every 8 (eight) hours as needed for pain.   aspirin EC 81 MG tablet Take 1 tablet (81 mg total) by mouth daily. Swallow whole. Start taking on: December 14, 2022   baclofen 10 MG tablet Commonly known as: LIORESAL Take 1 tablet (10 mg total) by mouth 3 (three) times daily. What changed:  when to take this reasons to take this   carboxymethylcellulose 1 % ophthalmic solution Apply 1 drop to eye as needed (  Dry eyes, allergies).   CENTRUM SILVER PO Take 1 tablet by mouth once a week.   clopidogrel 75 MG tablet Commonly known as: PLAVIX Take 1 tablet (75 mg total) by mouth daily. Start taking on: December 14, 2022   hydrocortisone cream 1 % Apply 1 Application topically 2 (two) times daily as needed for itching (Rashes).   ibuprofen 200 MG tablet Commonly known as: ADVIL Take 200 mg by mouth every 6 (six) hours as needed for headache or mild pain (pain score 1-3).   loratadine 10 MG tablet Commonly known as: CLARITIN Take 10 mg by mouth daily.   LORazepam 1 MG tablet Commonly known as: ATIVAN Take 1 mg by mouth at bedtime as needed.   losartan 25 MG tablet Commonly known as: COZAAR Take 25 mg by mouth daily.   Lumify 0.025 % Soln Generic drug: Brimonidine Tartrate Place 1 drop into both eyes daily.   mometasone 50 MCG/ACT nasal  spray Commonly known as: NASONEX Place 1 spray into the nose as needed (Allergies).   naproxen sodium 220 MG tablet Commonly known as: ALEVE Take 440 mg by mouth daily as needed (Joint Pain).   Nasacort Allergy 24HR 55 MCG/ACT Aero nasal inhaler Generic drug: triamcinolone Place 1 spray into the nose as needed (Seasonal allergies).   rosuvastatin 20 MG tablet Commonly known as: CRESTOR Take 1 tablet (20 mg total) by mouth daily. Start taking on: December 14, 2022   tiZANidine 4 MG tablet Commonly known as: ZANAFLEX Take 4 mg by mouth at bedtime as needed for muscle spasms (Alleviate sleep issues).   traZODone 50 MG tablet Commonly known as: DESYREL Take 1-2 tablets (50-100 mg total) by mouth at bedtime as needed for sleep.        Allergies  Allergen Reactions   Cephalosporins Hives and Rash   Ciprofloxacin Rash   Iodinated Contrast Media Hives, Itching and Rash    GADOLINIUM   Omeprazole Rash   Penicillins Itching, Rash and Other (See Comments)    Weezing, blisters Has patient had a PCN reaction causing immediate rash, facial/tongue/throat swelling, SOB or lightheadedness with hypotension: Yes Has patient had a PCN reaction causing severe rash involving mucus membranes or skin necrosis: No Has patient had a PCN reaction that required hospitalization: No Has patient had a PCN reaction occurring within the last 10 years: No If all of the above answers are "NO", then may proceed with Cephalosporin use.    Shrimp [Shellfish Allergy] Rash   Sulfa Antibiotics Hives and Rash   Sulfonamide Derivatives Rash   Omnipaque [Iohexol] Hives and Itching    Scratchy palms, scratchy throat, face and chest redness, hives all over. (CT fluoroscopic Contrast)       Other Procedures/Studies: EP STUDY  Result Date: 12/13/2022 See surgical note for result.  VAS US CAROTID (at [facility] and [facility] only)  Result Date: 12/12/2022 Carotid Arterial Duplex Study Patient Name:  Russell Thomas  Date  of Exam:   12/12/2022 Medical Rec #: [id number]        Accession #:    [id number] Date of Birth: 11-14-1960        Patient Gender: M Patient Age:   43 years Exam Location:  [facility] Procedure:      VAS US CAROTID Referring Phys: Marcial Pacas OPYD --------------------------------------------------------------------------------  Indications:       CVA. Risk Factors:      Hypertension, hyperlipidemia, past history of smoking. Comparison Study:  No prior exam. Performing Technologist: Salome Spotted  [place]  Examination Guidelines: A complete evaluation includes B-mode imaging, spectral Doppler, color Doppler, and power Doppler as needed of all accessible portions of each vessel. Bilateral testing is considered an integral part of a complete examination. Limited examinations for reoccurring indications may be performed as noted.  Right Carotid Findings: +----------+--------+--------+--------+------------------+--------+           PSV cm/sEDV cm/sStenosisPlaque DescriptionComments +----------+--------+--------+--------+------------------+--------+ CCA Prox  104     34                                         +----------+--------+--------+--------+------------------+--------+ CCA Distal111     33                                         +----------+--------+--------+--------+------------------+--------+ ICA Prox  90      20                                         +----------+--------+--------+--------+------------------+--------+ ICA Mid   46      20                                         +----------+--------+--------+--------+------------------+--------+ ICA Distal69      26                                         +----------+--------+--------+--------+------------------+--------+ ECA       93      17                                         +----------+--------+--------+--------+------------------+--------+ +----------+--------+-------+--------+-------------------+            PSV cm/sEDV cmsDescribeArm Pressure (mmHG) +----------+--------+-------+--------+-------------------+ [id number]     18                                 +----------+--------+-------+--------+-------------------+ +---------+--------+--+--------+--+ VertebralPSV cm/s35EDV cm/s13 +---------+--------+--+--------+--+  Left Carotid Findings: +----------+--------+--------+--------+------------------+--------+           PSV cm/sEDV cm/sStenosisPlaque DescriptionComments +----------+--------+--------+--------+------------------+--------+ CCA Prox  105     26                                         +----------+--------+--------+--------+------------------+--------+ CCA Distal86      26                                         +----------+--------+--------+--------+------------------+--------+ ICA Prox  63      20                                         +----------+--------+--------+--------+------------------+--------+  ICA Mid   54      24                                         +----------+--------+--------+--------+------------------+--------+ ICA Distal78      28                                         +----------+--------+--------+--------+------------------+--------+ ECA       84      17                                         +----------+--------+--------+--------+------------------+--------+ +----------+--------+--------+--------+-------------------+           PSV cm/sEDV cm/sDescribeArm Pressure (mmHG) +----------+--------+--------+--------+-------------------+ [id number]      18                                  +----------+--------+--------+--------+-------------------+ +---------+--------+--+--------+--+ VertebralPSV cm/s40EDV cm/s14 +---------+--------+--+--------+--+   Summary: Right Carotid: There is no evidence of stenosis in the right ICA. The ECA                appears <50% stenosed. The extracranial vessels were  near-normal                with only minimal wall thickening or plaque. Left Carotid: There is no evidence of stenosis in the left ICA. The ECA appears               <50% stenosed. The extracranial vessels were near-normal with only               minimal wall thickening or plaque. Vertebrals:  Bilateral vertebral arteries demonstrate antegrade flow. Subclavians: Normal flow hemodynamics were seen in bilateral subclavian              arteries. *See table(s) above for measurements and observations.  Electronically signed by Coral Else MD on 12/12/2022 at 9:28:56 PM.    Final    ECHOCARDIOGRAM COMPLETE  Result Date: 12/12/2022    ECHOCARDIOGRAM REPORT   Patient Name:   HORRACE LOEWEN Date of Exam: 12/12/2022 Medical Rec #:  [id number]       Height:       73.0 in Accession #:    [id number]      Weight:       313.9 lb Date of Birth:  1960-08-26       BSA:          2.606 m Patient Age:    62 years        BP:           125/64 mmHg Patient Gender: M               HR:           90 bpm. Exam Location:  Inpatient Procedure: 2D Echo, Cardiac Doppler and Color Doppler Indications:    TIA  History:        Patient has prior history of Echocardiogram examinations, most                 recent 04/23/2013. Stroke; Risk Factors:Hypertension and Sleep  Apnea.  Sonographer:    Karma Ganja Referring Phys: [id number] TIMOTHY S OPYD  Sonographer Comments: Patient is obese. IMPRESSIONS  1. Technically difficult study. Left ventricular ejection fraction, by estimation, is 60 to 65%. The left ventricle has normal function. Left ventricular endocardial border not optimally defined to evaluate regional wall motion. There is mild left ventricular hypertrophy. Left ventricular diastolic parameters were normal.  2. Right ventricular systolic function is normal. The right ventricular size is normal. Tricuspid regurgitation signal is inadequate for assessing PA pressure.  3. The mitral valve is normal in structure. Trivial mitral  valve regurgitation.  4. The aortic valve was not well visualized. Aortic valve regurgitation is not visualized. No aortic stenosis is present. FINDINGS  Left Ventricle: Left ventricular ejection fraction, by estimation, is 60 to 65%. The left ventricle has normal function. Left ventricular endocardial border not optimally defined to evaluate regional wall motion. The left ventricular internal cavity size was normal in size. There is mild left ventricular hypertrophy. Left ventricular diastolic parameters were normal. Right Ventricle: The right ventricular size is normal. Right vetricular wall thickness was not well visualized. Right ventricular systolic function is normal. Tricuspid regurgitation signal is inadequate for assessing PA pressure. Left Atrium: Left atrial size was normal in size. Right Atrium: Right atrial size was normal in size. Pericardium: There is no evidence of pericardial effusion. Presence of epicardial fat layer. Mitral Valve: The mitral valve is normal in structure. Trivial mitral valve regurgitation. Tricuspid Valve: The tricuspid valve is normal in structure. Tricuspid valve regurgitation is trivial. Aortic Valve: The aortic valve was not well visualized. Aortic valve regurgitation is not visualized. No aortic stenosis is present. Aortic valve mean gradient measures 5.0 mmHg. Aortic valve peak gradient measures 8.8 mmHg. Aortic valve area, by VTI measures 3.24 cm. Pulmonic Valve: The pulmonic valve was not well visualized. Pulmonic valve regurgitation is not visualized. Aorta: The aortic root and ascending aorta are structurally normal, with no evidence of dilitation. IAS/Shunts: The interatrial septum was not well visualized.  LEFT VENTRICLE PLAX 2D LVIDd:         4.90 cm   Diastology LVIDs:         3.40 cm   LV e' medial:    6.96 cm/s LV PW:         1.10 cm   LV E/e' medial:  8.4 LV IVS:        1.10 cm   LV e' lateral:   13.40 cm/s LVOT diam:     2.20 cm   LV E/e' lateral: 4.4 LV SV:          79 LV SV Index:   30 LVOT Area:     3.80 cm  RIGHT VENTRICLE RV Basal diam:  3.70 cm RV S prime:     16.40 cm/s TAPSE (M-mode): 2.3 cm LEFT ATRIUM             Index        RIGHT ATRIUM           Index LA diam:        3.90 cm 1.50 cm/m   RA Area:     14.10 cm LA Vol (A2C):   60.1 ml 23.06 ml/m  RA Volume:   28.10 ml  10.78 ml/m LA Vol (A4C):   47.7 ml 18.30 ml/m LA Biplane Vol: 55.7 ml 21.37 ml/m  AORTIC VALVE AV Area (Vmax):    3.06 cm AV Area (Vmean):   2.93 cm AV Area (  VTI):     3.24 cm AV Vmax:           148.00 cm/s AV Vmean:          97.500 cm/s AV VTI:            0.244 m AV Peak Grad:      8.8 mmHg AV Mean Grad:      5.0 mmHg LVOT Vmax:         119.00 cm/s LVOT Vmean:        75.100 cm/s LVOT VTI:          0.208 m LVOT/AV VTI ratio: 0.85  AORTA Ao Root diam: 3.30 cm Ao Asc diam:  3.70 cm MITRAL VALVE MV Area (PHT): 4.83 cm    SHUNTS MV Decel Time: 157 msec    Systemic VTI:  0.21 m MV E velocity: 58.30 cm/s  Systemic Diam: 2.20 cm MV A velocity: 81.80 cm/s MV E/A ratio:  0.71 Epifanio Lesches MD Electronically signed by Epifanio Lesches MD Signature Date/Time: 12/12/2022/5:06:44 PM    Final    MR BRAIN WO CONTRAST  Result Date: 12/11/2022 CLINICAL DATA:  Acute  neurologic deficit EXAM: MRI HEAD WITHOUT CONTRAST MRA HEAD WITHOUT CONTRAST TECHNIQUE: Multiplanar, multi-echo pulse sequences of the brain and surrounding structures were acquired without intravenous contrast. Angiographic images of the Circle of Willis were acquired using MRA technique without intravenous contrast. COMPARISON:  None Available. FINDINGS: MRI HEAD FINDINGS Brain: There are 2 small foci of abnormal diffusion restriction within the posterior left frontal lobe. No acute or chronic hemorrhage. Minimal multifocal hyperintense T2-weight signal within the white matter. A partially empty sella turcica is incidentally noted. Vascular: Normal flow voids. Skull and upper cervical spine: Normal calvarium and skull base.  Visualized upper cervical spine and soft tissues are normal. Sinuses/Orbits:No paranasal sinus fluid levels or advanced mucosal thickening. No mastoid or middle ear effusion. Normal orbits. MRA HEAD FINDINGS POSTERIOR CIRCULATION: --Vertebral arteries: Normal --Inferior cerebellar arteries: Normal. --Basilar artery: Normal. --Superior cerebellar arteries: Normal. --Posterior cerebral arteries: Normal. ANTERIOR CIRCULATION: --Intracranial internal carotid arteries: Normal. --Anterior cerebral arteries (ACA): Normal. --Middle cerebral arteries (MCA): Normal. IMPRESSION: 1. Two small acute infarcts within the posterior left frontal lobe. No hemorrhage or mass effect. 2. Normal intracranial MRA. Electronically Signed   By: Deatra Robinson M.D.   On: 12/11/2022 23:51   MR ANGIO HEAD WO CONTRAST  Result Date: 12/11/2022 CLINICAL DATA:  Acute  neurologic deficit EXAM: MRI HEAD WITHOUT CONTRAST MRA HEAD WITHOUT CONTRAST TECHNIQUE: Multiplanar, multi-echo pulse sequences of the brain and surrounding structures were acquired without intravenous contrast. Angiographic images of the Circle of Willis were acquired using MRA technique without intravenous contrast. COMPARISON:  None Available. FINDINGS: MRI HEAD FINDINGS Brain: There are 2 small foci of abnormal diffusion restriction within the posterior left frontal lobe. No acute or chronic hemorrhage. Minimal multifocal hyperintense T2-weight signal within the white matter. A partially empty sella turcica is incidentally noted. Vascular: Normal flow voids. Skull and upper cervical spine: Normal calvarium and skull base. Visualized upper cervical spine and soft tissues are normal. Sinuses/Orbits:No paranasal sinus fluid levels or advanced mucosal thickening. No mastoid or middle ear effusion. Normal orbits. MRA HEAD FINDINGS POSTERIOR CIRCULATION: --Vertebral arteries: Normal --Inferior cerebellar arteries: Normal. --Basilar artery: Normal. --Superior cerebellar arteries:  Normal. --Posterior cerebral arteries: Normal. ANTERIOR CIRCULATION: --Intracranial internal carotid arteries: Normal. --Anterior cerebral arteries (ACA): Normal. --Middle cerebral arteries (MCA): Normal. IMPRESSION: 1. Two small acute infarcts within the posterior left frontal lobe.  No hemorrhage or mass effect. 2. Normal intracranial MRA. Electronically Signed   By: Deatra Robinson M.D.   On: 12/11/2022 23:51   CT HEAD WO CONTRAST  Result Date: 12/11/2022 CLINICAL DATA:  Neuro deficit, acute, stroke suspected EXAM: CT HEAD WITHOUT CONTRAST TECHNIQUE: Contiguous axial images were obtained from the base of the skull through the vertex without intravenous contrast. RADIATION DOSE REDUCTION: This exam was performed according to the departmental dose-optimization program which includes automated exposure control, adjustment of the mA and/or kV according to patient size and/or use of iterative reconstruction technique. COMPARISON:  CT Head 02/06/05 FINDINGS: Brain: No hemorrhage. No hydrocephalus. No extra-axial fluid collection. No CT evidence of an acute cortical infarct. No mass effect. No mass lesion. Mega cisterna magna. Enlarged and partially empty sella. Vascular: No hyperdense vessel or unexpected calcification. Skull: Normal. Negative for fracture or focal lesion. Sinuses/Orbits: No middle ear or mastoid effusion. Paranasal sinuses are clear. Orbits are unremarkable. Other: None. IMPRESSION: No hemorrhage or CT evidence of an acute cortical infarct. Electronically Signed   By: Lorenza Cambridge M.D.   On: 12/11/2022 16:25     TODAY-DAY OF DISCHARGE:  Subjective:   Luna Glasgow today has no headache,no chest abdominal pain,no new weakness tingling or numbness, feels much better wants to go home today.   Objective:   Blood pressure 130/79, pulse 88, temperature 97.6 F (36.4 C), resp. rate 15, height 6\' 1"  (1.854 m), weight (!) 142.4 kg, SpO2 97%.  Intake/Output Summary (Last 24 hours) at 12/13/2022  1304 Last data filed at 12/13/2022 1225 Gross per 24 hour  Intake 440 ml  Output 0 ml  Net 440 ml   Filed Weights   12/11/22 1357  Weight: (!) 142.4 kg    Exam: Awake Alert, Oriented *3, No new F.N deficits, Normal affect [place].AT,PERRAL Supple Neck,No JVD, No cervical lymphadenopathy appriciated.  Symmetrical Chest wall movement, Good air movement bilaterally, CTAB RRR,No Gallops,Rubs or new Murmurs, No Parasternal Heave +ve B.Sounds, Abd Soft, Non tender, No organomegaly appriciated, No rebound -guarding or rigidity. No Cyanosis, Clubbing or edema, No new Rash or bruise   PERTINENT RADIOLOGIC STUDIES: EP STUDY  Result Date: 12/13/2022 See surgical note for result.  VAS US CAROTID (at [facility] and [facility] only)  Result Date: 12/12/2022 Carotid Arterial Duplex Study Patient Name:  Russell Thomas  Date of Exam:   12/12/2022 Medical Rec #: [id number]        Accession #:    [id number] Date of Birth: 13-May-1960        Patient Gender: M Patient Age:   54 years Exam Location:  [facility] Procedure:      VAS US CAROTID Referring Phys: Marcial Pacas OPYD --------------------------------------------------------------------------------  Indications:       CVA. Risk Factors:      Hypertension, hyperlipidemia, past history of smoking. Comparison Study:  No prior exam. Performing Technologist: Fernande Bras  Examination Guidelines: A complete evaluation includes B-mode imaging, spectral Doppler, color Doppler, and power Doppler as needed of all accessible portions of each vessel. Bilateral testing is considered an integral part of a complete examination. Limited examinations for reoccurring indications may be performed as noted.  Right Carotid Findings: +----------+--------+--------+--------+------------------+--------+           PSV cm/sEDV cm/sStenosisPlaque DescriptionComments +----------+--------+--------+--------+------------------+--------+ CCA Prox  104     34                                          +----------+--------+--------+--------+------------------+--------+  CCA Distal111     33                                         +----------+--------+--------+--------+------------------+--------+ ICA Prox  90      20                                         +----------+--------+--------+--------+------------------+--------+ ICA Mid   46      20                                         +----------+--------+--------+--------+------------------+--------+ ICA Distal69      26                                         +----------+--------+--------+--------+------------------+--------+ ECA       93      17                                         +----------+--------+--------+--------+------------------+--------+ +----------+--------+-------+--------+-------------------+           PSV cm/sEDV cmsDescribeArm Pressure (mmHG) +----------+--------+-------+--------+-------------------+ [id number]     18                                 +----------+--------+-------+--------+-------------------+ +---------+--------+--+--------+--+ VertebralPSV cm/s35EDV cm/s13 +---------+--------+--+--------+--+  Left Carotid Findings: +----------+--------+--------+--------+------------------+--------+           PSV cm/sEDV cm/sStenosisPlaque DescriptionComments +----------+--------+--------+--------+------------------+--------+ CCA Prox  105     26                                         +----------+--------+--------+--------+------------------+--------+ CCA Distal86      26                                         +----------+--------+--------+--------+------------------+--------+ ICA Prox  63      20                                         +----------+--------+--------+--------+------------------+--------+ ICA Mid   54      24                                         +----------+--------+--------+--------+------------------+--------+ ICA Distal78       28                                         +----------+--------+--------+--------+------------------+--------+ ECA       84  17                                         +----------+--------+--------+--------+------------------+--------+ +----------+--------+--------+--------+-------------------+           PSV cm/sEDV cm/sDescribeArm Pressure (mmHG) +----------+--------+--------+--------+-------------------+ [id number]      18                                  +----------+--------+--------+--------+-------------------+ +---------+--------+--+--------+--+ VertebralPSV cm/s40EDV cm/s14 +---------+--------+--+--------+--+   Summary: Right Carotid: There is no evidence of stenosis in the right ICA. The ECA                appears <50% stenosed. The extracranial vessels were near-normal                with only minimal wall thickening or plaque. Left Carotid: There is no evidence of stenosis in the left ICA. The ECA appears               <50% stenosed. The extracranial vessels were near-normal with only               minimal wall thickening or plaque. Vertebrals:  Bilateral vertebral arteries demonstrate antegrade flow. Subclavians: Normal flow hemodynamics were seen in bilateral subclavian              arteries. *See table(s) above for measurements and observations.  Electronically signed by Coral Else MD on 12/12/2022 at 9:28:56 PM.    Final    ECHOCARDIOGRAM COMPLETE  Result Date: 12/12/2022    ECHOCARDIOGRAM REPORT   Patient Name:   JAIVEN SANDUSKY Date of Exam: 12/12/2022 Medical Rec #:  [id number]       Height:       73.0 in Accession #:    [id number]      Weight:       313.9 lb Date of Birth:  1960-08-02       BSA:          2.606 m Patient Age:    62 years        BP:           125/64 mmHg Patient Gender: M               HR:           90 bpm. Exam Location:  Inpatient Procedure: 2D Echo, Cardiac Doppler and Color Doppler Indications:    TIA  History:        Patient has prior  history of Echocardiogram examinations, most                 recent 04/23/2013. Stroke; Risk Factors:Hypertension and Sleep                 Apnea.  Sonographer:    Karma Ganja Referring Phys: [id number] TIMOTHY S OPYD  Sonographer Comments: Patient is obese. IMPRESSIONS  1. Technically difficult study. Left ventricular ejection fraction, by estimation, is 60 to 65%. The left ventricle has normal function. Left ventricular endocardial border not optimally defined to evaluate regional wall motion. There is mild left ventricular hypertrophy. Left ventricular diastolic parameters were normal.  2. Right ventricular systolic function is normal. The right ventricular size is normal. Tricuspid regurgitation signal is inadequate for assessing PA pressure.  3. The mitral valve is normal in structure. Trivial mitral valve  regurgitation.  4. The aortic valve was not well visualized. Aortic valve regurgitation is not visualized. No aortic stenosis is present. FINDINGS  Left Ventricle: Left ventricular ejection fraction, by estimation, is 60 to 65%. The left ventricle has normal function. Left ventricular endocardial border not optimally defined to evaluate regional wall motion. The left ventricular internal cavity size was normal in size. There is mild left ventricular hypertrophy. Left ventricular diastolic parameters were normal. Right Ventricle: The right ventricular size is normal. Right vetricular wall thickness was not well visualized. Right ventricular systolic function is normal. Tricuspid regurgitation signal is inadequate for assessing PA pressure. Left Atrium: Left atrial size was normal in size. Right Atrium: Right atrial size was normal in size. Pericardium: There is no evidence of pericardial effusion. Presence of epicardial fat layer. Mitral Valve: The mitral valve is normal in structure. Trivial mitral valve regurgitation. Tricuspid Valve: The tricuspid valve is normal in structure. Tricuspid valve regurgitation is  trivial. Aortic Valve: The aortic valve was not well visualized. Aortic valve regurgitation is not visualized. No aortic stenosis is present. Aortic valve mean gradient measures 5.0 mmHg. Aortic valve peak gradient measures 8.8 mmHg. Aortic valve area, by VTI measures 3.24 cm. Pulmonic Valve: The pulmonic valve was not well visualized. Pulmonic valve regurgitation is not visualized. Aorta: The aortic root and ascending aorta are structurally normal, with no evidence of dilitation. IAS/Shunts: The interatrial septum was not well visualized.  LEFT VENTRICLE PLAX 2D LVIDd:         4.90 cm   Diastology LVIDs:         3.40 cm   LV e' medial:    6.96 cm/s LV PW:         1.10 cm   LV E/e' medial:  8.4 LV IVS:        1.10 cm   LV e' lateral:   13.40 cm/s LVOT diam:     2.20 cm   LV E/e' lateral: 4.4 LV SV:         79 LV SV Index:   30 LVOT Area:     3.80 cm  RIGHT VENTRICLE RV Basal diam:  3.70 cm RV S prime:     16.40 cm/s TAPSE (M-mode): 2.3 cm LEFT ATRIUM             Index        RIGHT ATRIUM           Index LA diam:        3.90 cm 1.50 cm/m   RA Area:     14.10 cm LA Vol (A2C):   60.1 ml 23.06 ml/m  RA Volume:   28.10 ml  10.78 ml/m LA Vol (A4C):   47.7 ml 18.30 ml/m LA Biplane Vol: 55.7 ml 21.37 ml/m  AORTIC VALVE AV Area (Vmax):    3.06 cm AV Area (Vmean):   2.93 cm AV Area (VTI):     3.24 cm AV Vmax:           148.00 cm/s AV Vmean:          97.500 cm/s AV VTI:            0.244 m AV Peak Grad:      8.8 mmHg AV Mean Grad:      5.0 mmHg LVOT Vmax:         119.00 cm/s LVOT Vmean:        75.100 cm/s LVOT VTI:  0.208 m LVOT/AV VTI ratio: 0.85  AORTA Ao Root diam: 3.30 cm Ao Asc diam:  3.70 cm MITRAL VALVE MV Area (PHT): 4.83 cm    SHUNTS MV Decel Time: 157 msec    Systemic VTI:  0.21 m MV E velocity: 58.30 cm/s  Systemic Diam: 2.20 cm MV A velocity: 81.80 cm/s MV E/A ratio:  0.71 Epifanio Lesches MD Electronically signed by Epifanio Lesches MD Signature Date/Time: 12/12/2022/5:06:44 PM    Final     MR BRAIN WO CONTRAST  Result Date: 12/11/2022 CLINICAL DATA:  Acute  neurologic deficit EXAM: MRI HEAD WITHOUT CONTRAST MRA HEAD WITHOUT CONTRAST TECHNIQUE: Multiplanar, multi-echo pulse sequences of the brain and surrounding structures were acquired without intravenous contrast. Angiographic images of the Circle of Willis were acquired using MRA technique without intravenous contrast. COMPARISON:  None Available. FINDINGS: MRI HEAD FINDINGS Brain: There are 2 small foci of abnormal diffusion restriction within the posterior left frontal lobe. No acute or chronic hemorrhage. Minimal multifocal hyperintense T2-weight signal within the white matter. A partially empty sella turcica is incidentally noted. Vascular: Normal flow voids. Skull and upper cervical spine: Normal calvarium and skull base. Visualized upper cervical spine and soft tissues are normal. Sinuses/Orbits:No paranasal sinus fluid levels or advanced mucosal thickening. No mastoid or middle ear effusion. Normal orbits. MRA HEAD FINDINGS POSTERIOR CIRCULATION: --Vertebral arteries: Normal --Inferior cerebellar arteries: Normal. --Basilar artery: Normal. --Superior cerebellar arteries: Normal. --Posterior cerebral arteries: Normal. ANTERIOR CIRCULATION: --Intracranial internal carotid arteries: Normal. --Anterior cerebral arteries (ACA): Normal. --Middle cerebral arteries (MCA): Normal. IMPRESSION: 1. Two small acute infarcts within the posterior left frontal lobe. No hemorrhage or mass effect. 2. Normal intracranial MRA. Electronically Signed   By: Deatra Robinson M.D.   On: 12/11/2022 23:51   MR ANGIO HEAD WO CONTRAST  Result Date: 12/11/2022 CLINICAL DATA:  Acute  neurologic deficit EXAM: MRI HEAD WITHOUT CONTRAST MRA HEAD WITHOUT CONTRAST TECHNIQUE: Multiplanar, multi-echo pulse sequences of the brain and surrounding structures were acquired without intravenous contrast. Angiographic images of the Circle of Willis were acquired using MRA  technique without intravenous contrast. COMPARISON:  None Available. FINDINGS: MRI HEAD FINDINGS Brain: There are 2 small foci of abnormal diffusion restriction within the posterior left frontal lobe. No acute or chronic hemorrhage. Minimal multifocal hyperintense T2-weight signal within the white matter. A partially empty sella turcica is incidentally noted. Vascular: Normal flow voids. Skull and upper cervical spine: Normal calvarium and skull base. Visualized upper cervical spine and soft tissues are normal. Sinuses/Orbits:No paranasal sinus fluid levels or advanced mucosal thickening. No mastoid or middle ear effusion. Normal orbits. MRA HEAD FINDINGS POSTERIOR CIRCULATION: --Vertebral arteries: Normal --Inferior cerebellar arteries: Normal. --Basilar artery: Normal. --Superior cerebellar arteries: Normal. --Posterior cerebral arteries: Normal. ANTERIOR CIRCULATION: --Intracranial internal carotid arteries: Normal. --Anterior cerebral arteries (ACA): Normal. --Middle cerebral arteries (MCA): Normal. IMPRESSION: 1. Two small acute infarcts within the posterior left frontal lobe. No hemorrhage or mass effect. 2. Normal intracranial MRA. Electronically Signed   By: Deatra Robinson M.D.   On: 12/11/2022 23:51   CT HEAD WO CONTRAST  Result Date: 12/11/2022 CLINICAL DATA:  Neuro deficit, acute, stroke suspected EXAM: CT HEAD WITHOUT CONTRAST TECHNIQUE: Contiguous axial images were obtained from the base of the skull through the vertex without intravenous contrast. RADIATION DOSE REDUCTION: This exam was performed according to the departmental dose-optimization program which includes automated exposure control, adjustment of the mA and/or kV according to patient size and/or use of iterative reconstruction technique. COMPARISON:  CT Head 02/06/05  FINDINGS: Brain: No hemorrhage. No hydrocephalus. No extra-axial fluid collection. No CT evidence of an acute cortical infarct. No mass effect. No mass lesion. Mega cisterna  magna. Enlarged and partially empty sella. Vascular: No hyperdense vessel or unexpected calcification. Skull: Normal. Negative for fracture or focal lesion. Sinuses/Orbits: No middle ear or mastoid effusion. Paranasal sinuses are clear. Orbits are unremarkable. Other: None. IMPRESSION: No hemorrhage or CT evidence of an acute cortical infarct. Electronically Signed   By: Lorenza Cambridge M.D.   On: 12/11/2022 16:25     PERTINENT LAB RESULTS: CBC: Recent Labs    12/12/22 0156 12/13/22 0437  WBC 9.4 7.3  HGB 15.0 14.8  HCT 44.5 43.5  PLT 209 195   CMET CMP     Component Value Date/Time   NA 138 12/13/2022 0437   NA 138 11/29/2014 0000   K 5.2 (H) 12/13/2022 0437   CL 103 12/13/2022 0437   CO2 24 12/13/2022 0437   GLUCOSE 119 (H) 12/13/2022 0437   BUN 12 12/13/2022 0437   BUN 18 11/29/2014 0000   CREATININE 1.01 12/13/2022 0437   CREATININE 1.06 08/31/2015 1539   CALCIUM 9.3 12/13/2022 0437   PROT 6.9 12/11/2022 1403   PROT 6.8 10/18/2015 0934   ALBUMIN 4.1 12/11/2022 1403   AST 21 12/11/2022 1403   ALT 31 12/11/2022 1403   ALKPHOS 60 12/11/2022 1403   BILITOT 0.7 12/11/2022 1403   GFR 85.59 10/16/2022 1459   GFRNONAA >60 12/13/2022 0437   GFRNONAA 78 08/31/2015 1539    GFR Estimated Creatinine Clearance: 109.6 mL/min (by C-G formula based on SCr of 1.01 mg/dL). No results for input(s): "LIPASE", "AMYLASE" in the last 72 hours. No results for input(s): "CKTOTAL", "CKMB", "CKMBINDEX", "TROPONINI" in the last 72 hours. Invalid input(s): "POCBNP" No results for input(s): "DDIMER" in the last 72 hours. Recent Labs    12/12/22 0156  HGBA1C 5.8*   Recent Labs    12/12/22 0156  CHOL 174  HDL 44  LDLCALC 100*  TRIG 152*  CHOLHDL 4.0   No results for input(s): "TSH", "T4TOTAL", "T3FREE", "THYROIDAB" in the last 72 hours.  Invalid input(s): "FREET3" No results for input(s): "VITAMINB12", "FOLATE", "FERRITIN", "TIBC", "IRON", "RETICCTPCT" in the last 72  hours. Coags: Recent Labs    12/11/22 1403  INR 1.0   Microbiology: No results found for this or any previous visit (from the past 240 hour(s)).  FURTHER DISCHARGE INSTRUCTIONS:  Get Medicines reviewed and adjusted: Please take all your medications with you for your next visit with your Primary MD  Laboratory/radiological data: Please request your Primary MD to go over all hospital tests and procedure/radiological results at the follow up, please ask your Primary MD to get all Hospital records sent to his/her office.  In some cases, they will be blood work, cultures and biopsy results pending at the time of your discharge. Please request that your primary care M.D. goes through all the records of your hospital data and follows up on these results.  Also Note the following: If you experience worsening of your admission symptoms, develop shortness of breath, life threatening emergency, suicidal or homicidal thoughts you must seek medical attention immediately by calling 911 or calling your MD immediately  if symptoms less severe.  You must read complete instructions/literature along with all the possible adverse reactions/side effects for all the Medicines you take and that have been prescribed to you. Take any new Medicines after you have completely understood and accpet all the possible adverse  reactions/side effects.   Do not drive when taking Pain medications or sleeping medications (Benzodaizepines)  Do not take more than prescribed Pain, Sleep and Anxiety Medications. It is not advisable to combine anxiety,sleep and pain medications without talking with your primary care practitioner  Special Instructions: If you have smoked or chewed Tobacco  in the last 2 yrs please stop smoking, stop any regular Alcohol  and or any Recreational drug use.  Wear Seat belts while driving.  Please note: You were cared for by a hospitalist during your hospital stay. Once you are discharged, your  primary care physician will handle any further medical issues. Please note that NO REFILLS for any discharge medications will be authorized once you are discharged, as it is imperative that you return to your primary care physician (or establish a relationship with a primary care physician if you do not have one) for your post hospital discharge needs so that they can reassess your need for medications and monitor your lab values.  Total Time spent coordinating discharge including counseling, education and face to face time equals greater than 30 minutes.  SignedJeoffrey Massed 12/13/2022 1:04 PM

## 2022-12-13 NOTE — CV Procedure (Signed)
    TRANSESOPHAGEAL ECHOCARDIOGRAM   NAME:  ASHTEN PRATS    MRN: [id number] DOB:  [birth year]    ADMIT DATE: 12/11/2022  INDICATIONS: Acute CVA  PROCEDURE:   Informed consent was obtained prior to the procedure. The risks, benefits and alternatives for the procedure were discussed and the patient comprehended these risks.  Risks include, but are not limited to, cough, sore throat, vomiting, nausea, somnolence, esophageal and stomach trauma or perforation, bleeding, low blood pressure, aspiration, pneumonia, infection, trauma to the teeth and death.    Procedural time out performed. The oropharynx was anesthetized.  Anesthesia was administered by anesthesia team (see their records).  The transesophageal probe was inserted in the esophagus and stomach without difficulty and multiple views were obtained.   COMPLICATIONS:    There were no immediate complications.  KEY FINDINGS:  LVEF preserved. Patent Foramen Ovale  - positive bubble study.  Full report to follow. Further management per primary team.   [name], DO, [facility]  [facility]  [address] [place], [place] [address] Pager: [phone] Office: [phone] 12/13/22 12:41 PM

## 2022-12-13 NOTE — Anesthesia Preprocedure Evaluation (Signed)
Anesthesia Evaluation  Patient identified by MRN, date of birth, ID band Patient awake    Reviewed: Allergy & Precautions, NPO status , Patient's Chart, lab work & pertinent test results  Airway Mallampati: II  TM Distance: >3 FB Neck ROM: Full    Dental  (+) Teeth Intact, Dental Advisory Given, Caps,    Pulmonary sleep apnea and Continuous Positive Airway Pressure Ventilation , former smoker   Pulmonary exam normal breath sounds clear to auscultation       Cardiovascular hypertension, Pt. on medications Normal cardiovascular exam Rhythm:Regular Rate:Normal     Neuro/Psych  PSYCHIATRIC DISORDERS Anxiety     CVA, No Residual Symptoms    GI/Hepatic Neg liver ROS,GERD  ,,  Endo/Other    Morbid obesity  Renal/GU negative Renal ROS     Musculoskeletal negative musculoskeletal ROS (+)    Abdominal   Peds  Hematology negative hematology ROS (+)   Anesthesia Other Findings Day of surgery medications reviewed with the patient.  Reproductive/Obstetrics                             Anesthesia Physical Anesthesia Plan  ASA: 3  Anesthesia Plan: MAC   Post-op Pain Management: Minimal or no pain anticipated   Induction: Intravenous  PONV Risk Score and Plan: 1 and TIVA and Treatment may vary due to age or medical condition  Airway Management Planned: Natural Airway and Simple Face Mask  Additional Equipment:   Intra-op Plan:   Post-operative Plan:   Informed Consent: I have reviewed the patients History and Physical, chart, labs and discussed the procedure including the risks, benefits and alternatives for the proposed anesthesia with the patient or authorized representative who has indicated his/her understanding and acceptance.     Dental advisory given  Plan Discussed with: CRNA  Anesthesia Plan Comments:        Anesthesia Quick Evaluation

## 2022-12-13 NOTE — Progress Notes (Addendum)
STROKE TEAM PROGRESS NOTE   BRIEF HPI Mr. Russell Thomas is a 62 y.o. male with history of Prediabetes, OSA, HLD who presents with an episode of aphasia lasting 10 secs, followed by several hours of mild R leg weakness and feeling like he is dragging his R leg.    SIGNIFICANT HOSPITAL EVENTS  11/5 MRI brain Two small acute infarcts within the posterior left frontal lobe   INTERIM HISTORY/SUBJECTIVE Patient seen and examined in cath lab holding prior to TEE. He states he has no symptoms and is anxious to get home.  He declined to enroll in the [facility] Stroke study   OBJECTIVE  CBC    Component Value Date/Time   WBC 7.3 12/13/2022 0437   RBC 4.82 12/13/2022 0437   HGB 14.8 12/13/2022 0437   HGB 15.4 10/18/2015 0934   HCT 43.5 12/13/2022 0437   HCT 44.2 10/18/2015 0934   PLT 195 12/13/2022 0437   PLT 195 10/18/2015 0934   MCV 90.2 12/13/2022 0437   MCV 90.2 10/18/2015 0934   MCH 30.7 12/13/2022 0437   MCHC 34.0 12/13/2022 0437   RDW 12.9 12/13/2022 0437   RDW 12.9 10/18/2015 0934   LYMPHSABS 2.4 12/11/2022 1403   LYMPHSABS 2.2 10/18/2015 0934   MONOABS 0.7 12/11/2022 1403   MONOABS 0.6 10/18/2015 0934   EOSABS 0.0 12/11/2022 1403   EOSABS 0.1 10/18/2015 0934   BASOSABS 0.0 12/11/2022 1403   BASOSABS 0.0 10/18/2015 0934    BMET    Component Value Date/Time   NA 138 12/13/2022 0437   NA 138 11/29/2014 0000   K 5.2 (H) 12/13/2022 0437   CL 103 12/13/2022 0437   CO2 24 12/13/2022 0437   GLUCOSE 119 (H) 12/13/2022 0437   BUN 12 12/13/2022 0437   BUN 18 11/29/2014 0000   CREATININE 1.01 12/13/2022 0437   CREATININE 1.06 08/31/2015 1539   CALCIUM 9.3 12/13/2022 0437   GFRNONAA >60 12/13/2022 0437   GFRNONAA 78 08/31/2015 1539    IMAGING past 24 hours EP STUDY  Result Date: 12/13/2022 See surgical note for result.  VAS US CAROTID (at [facility] and [facility] only)  Result Date: 12/12/2022 Carotid Arterial Duplex Study Patient Name:  Russell Thomas  Date of Exam:    12/12/2022 Medical Rec #: [id number]        Accession #:    [id number] Date of Birth: [birth year]        Patient Gender: M Patient Age:   43 years Exam Location:  [facility] Procedure:      VAS US CAROTID Referring Phys: Marcial Pacas OPYD --------------------------------------------------------------------------------  Indications:       CVA. Risk Factors:      Hypertension, hyperlipidemia, past history of smoking. Comparison Study:  No prior exam. Performing Technologist: Fernande Bras  Examination Guidelines: A complete evaluation includes B-mode imaging, spectral Doppler, color Doppler, and power Doppler as needed of all accessible portions of each vessel. Bilateral testing is considered an integral part of a complete examination. Limited examinations for reoccurring indications may be performed as noted.  Right Carotid Findings: +----------+--------+--------+--------+------------------+--------+           PSV cm/sEDV cm/sStenosisPlaque DescriptionComments +----------+--------+--------+--------+------------------+--------+ CCA Prox  104     34                                         +----------+--------+--------+--------+------------------+--------+ CCA [id number]  33                                         +----------+--------+--------+--------+------------------+--------+ ICA Prox  90      20                                         +----------+--------+--------+--------+------------------+--------+ ICA Mid   46      20                                         +----------+--------+--------+--------+------------------+--------+ ICA Distal69      26                                         +----------+--------+--------+--------+------------------+--------+ ECA       93      17                                         +----------+--------+--------+--------+------------------+--------+ +----------+--------+-------+--------+-------------------+           PSV  cm/sEDV cmsDescribeArm Pressure (mmHG) +----------+--------+-------+--------+-------------------+ [id number]     18                                 +----------+--------+-------+--------+-------------------+ +---------+--------+--+--------+--+ VertebralPSV cm/s35EDV cm/s13 +---------+--------+--+--------+--+  Left Carotid Findings: +----------+--------+--------+--------+------------------+--------+           PSV cm/sEDV cm/sStenosisPlaque DescriptionComments +----------+--------+--------+--------+------------------+--------+ CCA Prox  105     26                                         +----------+--------+--------+--------+------------------+--------+ CCA Distal86      26                                         +----------+--------+--------+--------+------------------+--------+ ICA Prox  63      20                                         +----------+--------+--------+--------+------------------+--------+ ICA Mid   54      24                                         +----------+--------+--------+--------+------------------+--------+ ICA Distal78      28                                         +----------+--------+--------+--------+------------------+--------+ ECA       84      17                                         +----------+--------+--------+--------+------------------+--------+ +----------+--------+--------+--------+-------------------+  PSV cm/sEDV cm/sDescribeArm Pressure (mmHG) +----------+--------+--------+--------+-------------------+ Subclavian94      18                                  +----------+--------+--------+--------+-------------------+ +---------+--------+--+--------+--+ VertebralPSV cm/s40EDV cm/s14 +---------+--------+--+--------+--+   Summary: Right Carotid: There is no evidence of stenosis in the right ICA. The ECA                appears <50% stenosed. The extracranial vessels were near-normal                 with only minimal wall thickening or plaque. Left Carotid: There is no evidence of stenosis in the left ICA. The ECA appears               <50% stenosed. The extracranial vessels were near-normal with only               minimal wall thickening or plaque. Vertebrals:  Bilateral vertebral arteries demonstrate antegrade flow. Subclavians: Normal flow hemodynamics were seen in bilateral subclavian              arteries. *See table(s) above for measurements and observations.  Electronically signed by Coral Else MD on 12/12/2022 at 9:28:56 PM.    Final    ECHOCARDIOGRAM COMPLETE  Result Date: 12/12/2022    ECHOCARDIOGRAM REPORT   Patient Name:   DAVEON ARPINO Date of Exam: 12/12/2022 Medical Rec #:  [id number]       Height:       73.0 in Accession #:    [id number]      Weight:       313.9 lb Date of Birth:  31-Dec-1960       BSA:          2.606 m Patient Age:    62 years        BP:           125/64 mmHg Patient Gender: M               HR:           90 bpm. Exam Location:  Inpatient Procedure: 2D Echo, Cardiac Doppler and Color Doppler Indications:    TIA  History:        Patient has prior history of Echocardiogram examinations, most                 recent 04/23/2013. Stroke; Risk Factors:Hypertension and Sleep                 Apnea.  Sonographer:    Karma Ganja Referring Phys: [id number] TIMOTHY S OPYD  Sonographer Comments: Patient is obese. IMPRESSIONS  1. Technically difficult study. Left ventricular ejection fraction, by estimation, is 60 to 65%. The left ventricle has normal function. Left ventricular endocardial border not optimally defined to evaluate regional wall motion. There is mild left ventricular hypertrophy. Left ventricular diastolic parameters were normal.  2. Right ventricular systolic function is normal. The right ventricular size is normal. Tricuspid regurgitation signal is inadequate for assessing PA pressure.  3. The mitral valve is normal in structure. Trivial mitral valve regurgitation.   4. The aortic valve was not well visualized. Aortic valve regurgitation is not visualized. No aortic stenosis is present. FINDINGS  Left Ventricle: Left ventricular ejection fraction, by estimation, is 60 to 65%. The left ventricle has normal function. Left ventricular endocardial border not optimally defined to evaluate regional wall  motion. The left ventricular internal cavity size was normal in size. There is mild left ventricular hypertrophy. Left ventricular diastolic parameters were normal. Right Ventricle: The right ventricular size is normal. Right vetricular wall thickness was not well visualized. Right ventricular systolic function is normal. Tricuspid regurgitation signal is inadequate for assessing PA pressure. Left Atrium: Left atrial size was normal in size. Right Atrium: Right atrial size was normal in size. Pericardium: There is no evidence of pericardial effusion. Presence of epicardial fat layer. Mitral Valve: The mitral valve is normal in structure. Trivial mitral valve regurgitation. Tricuspid Valve: The tricuspid valve is normal in structure. Tricuspid valve regurgitation is trivial. Aortic Valve: The aortic valve was not well visualized. Aortic valve regurgitation is not visualized. No aortic stenosis is present. Aortic valve mean gradient measures 5.0 mmHg. Aortic valve peak gradient measures 8.8 mmHg. Aortic valve area, by VTI measures 3.24 cm. Pulmonic Valve: The pulmonic valve was not well visualized. Pulmonic valve regurgitation is not visualized. Aorta: The aortic root and ascending aorta are structurally normal, with no evidence of dilitation. IAS/Shunts: The interatrial septum was not well visualized.  LEFT VENTRICLE PLAX 2D LVIDd:         4.90 cm   Diastology LVIDs:         3.40 cm   LV e' medial:    6.96 cm/s LV PW:         1.10 cm   LV E/e' medial:  8.4 LV IVS:        1.10 cm   LV e' lateral:   13.40 cm/s LVOT diam:     2.20 cm   LV E/e' lateral: 4.4 LV SV:         79 LV SV Index:    30 LVOT Area:     3.80 cm  RIGHT VENTRICLE RV Basal diam:  3.70 cm RV S prime:     16.40 cm/s TAPSE (M-mode): 2.3 cm LEFT ATRIUM             Index        RIGHT ATRIUM           Index LA diam:        3.90 cm 1.50 cm/m   RA Area:     14.10 cm LA Vol (A2C):   60.1 ml 23.06 ml/m  RA Volume:   28.10 ml  10.78 ml/m LA Vol (A4C):   47.7 ml 18.30 ml/m LA Biplane Vol: 55.7 ml 21.37 ml/m  AORTIC VALVE AV Area (Vmax):    3.06 cm AV Area (Vmean):   2.93 cm AV Area (VTI):     3.24 cm AV Vmax:           148.00 cm/s AV Vmean:          97.500 cm/s AV VTI:            0.244 m AV Peak Grad:      8.8 mmHg AV Mean Grad:      5.0 mmHg LVOT Vmax:         119.00 cm/s LVOT Vmean:        75.100 cm/s LVOT VTI:          0.208 m LVOT/AV VTI ratio: 0.85  AORTA Ao Root diam: 3.30 cm Ao Asc diam:  3.70 cm MITRAL VALVE MV Area (PHT): 4.83 cm    SHUNTS MV Decel Time: 157 msec    Systemic VTI:  0.21 m MV E velocity: 58.30 cm/s  Systemic Diam:  2.20 cm MV A velocity: 81.80 cm/s MV E/A ratio:  0.71 Epifanio Lesches MD Electronically signed by Epifanio Lesches MD Signature Date/Time: 12/12/2022/5:06:44 PM    Final     Vitals:   12/13/22 1105 12/13/22 1110 12/13/22 1253 12/13/22 1257  BP: 130/79 130/79    Pulse: 79 77  88  Resp: 16 18  15   Temp:   97.6 F (36.4 C)   TempSrc:      SpO2: 93% 95%  97%  Weight:      Height:         PHYSICAL EXAM General:  Alert, well-nourished, well-developed patient in no acute distress Psych:  Mood and affect appropriate for situation CV: Regular rate and rhythm on monitor Respiratory:  Regular, unlabored respirations on room air GI: Abdomen soft and nontender   NEURO:  Mental Status: AA&Ox3, patient is able to give clear and coherent history Speech/Language: speech is without dysarthria or aphasia.  Naming, repetition, fluency, and comprehension intact.  Cranial Nerves:  II: PERRL. Visual fields full.  III, IV, VI: EOMI. Eyelids elevate symmetrically.  V: Sensation is intact  to light touch and symmetrical to face.  VII: Face is symmetrical resting and smiling VIII: hearing intact to voice. IX, X: Palate elevates symmetrically. Phonation is normal.  DG:UYQIHKVQ shrug 5/5. XII: tongue is midline without fasciculations. Motor: 5/5 strength to all muscle groups tested.  Tone: is normal and bulk is normal Sensation- Intact to light touch bilaterally. Extinction absent to light touch to DSS.   Coordination: FTN intact bilaterally, HKS: no ataxia in BLE.No drift.  Gait- deferred  ASSESSMENT/PLAN  Acute Ischemic Infarct:  left frontal lobe Etiology: Likely cardioembolic versus cryptogenic CT head No acute abnormality.    MRI  Two small acute infarcts within the posterior left frontal lobe  MRA no LVO Carotid Doppler negative  2D Echo EF 60-65%. LV mild hypertrophy.  TEE positive for PFO  recommend 30 day monitoring  LDL 100 HgbA1c 5.8 VTE prophylaxis -Lovenox No antithrombotic prior to admission, now on aspirin 81 mg daily and clopidogrel 75 mg daily for 3 weeks and then aspirin alone. Will need outpatient follow up with neurology 8 weeks after discharge  Therapy recommendations:  Pending Disposition: Pending  Hypertension Home meds: Losartan 25 mg, Stable Blood Pressure Goal: SBP less than 160   Hyperlipidemia Home meds: None LDL 100, goal < 70 Add this to her 20 mg Continue statin at discharge  Tobacco Abuse Former cigar smoker  Dysphagia Patient has post-stroke dysphagia, SLP consulted    Diet   Diet NPO time specified   Advance diet as tolerated  Other Stroke Risk Factors ETOH use, alcohol level <10, advised to drink no more than 2 drink(s) a day Obesity, Body mass index is 41.42 kg/m., BMI >/= 30 associated with increased stroke risk, recommend weight loss, diet and exercise as appropriate  Obstructive sleep apnea, on CPAP at home   Other Active Problems   Hospital day # 0  Gevena Mart DNP, ACNPC-AG  [facility]  Neurohospitalist I have personally obtained history,examined this patient, reviewed notes, independently viewed imaging studies, participated in medical decision making and plan of care.ROS completed by me personally and pertinent positives fully documented  I have made any additions or clarifications directly to the above note. Agree with note above.  Patient remains neurologically stable and has had no recurrent symptoms.  TEE shows no cardiac source of embolism but he has a PFO.  Recommend check lower extremity venous Dopplers and  TCD bubble study to categorize the PFO.  He will likely need elective endovascular PFO closure as an outpatient.  Greater than 50% time during this 31-minute visit was spent in counseling and coordination of care and discussion patient care team and answering questions.  Delia Heady, MD Medical Director [facility] Pager: [phone] 12/13/2022 2:13 PM      To contact Stroke Continuity provider, please refer to [email or web address]. After hours, contact General Neurology

## 2022-12-13 NOTE — Progress Notes (Signed)
Cardiology asked to place 30d in the setting of CVA.

## 2022-12-13 NOTE — Plan of Care (Signed)
  Problem: Education: Goal: Knowledge of disease or condition will improve Outcome: Completed/Met Goal: Knowledge of secondary prevention will improve (MUST DOCUMENT ALL) 12/13/2022 1457 by Elwanda Brooklyn, RN Outcome: Completed/Met 12/13/2022 1456 by Elwanda Brooklyn, RN Outcome: Progressing Goal: Knowledge of patient specific risk factors will improve Loraine Leriche N/A or DELETE if not current risk factor) 12/13/2022 1457 by Elwanda Brooklyn, RN Outcome: Completed/Met 12/13/2022 1456 by Elwanda Brooklyn, RN Outcome: Progressing   Problem: Ischemic Stroke/TIA Tissue Perfusion: Goal: Complications of ischemic stroke/TIA will be minimized 12/13/2022 1457 by Elwanda Brooklyn, RN Outcome: Completed/Met 12/13/2022 1456 by Elwanda Brooklyn, RN Outcome: Progressing   Problem: Coping: Goal: Will verbalize positive feelings about self 12/13/2022 1457 by Elwanda Brooklyn, RN Outcome: Completed/Met 12/13/2022 1456 by Elwanda Brooklyn, RN Outcome: Progressing Goal: Will identify appropriate support needs 12/13/2022 1457 by Elwanda Brooklyn, RN Outcome: Completed/Met 12/13/2022 1456 by Elwanda Brooklyn, RN Outcome: Progressing   Problem: Health Behavior/Discharge Planning: Goal: Ability to manage health-related needs will improve 12/13/2022 1457 by Elwanda Brooklyn, RN Outcome: Completed/Met 12/13/2022 1456 by Elwanda Brooklyn, RN Outcome: Progressing Goal: Goals will be collaboratively established with patient/family 12/13/2022 1457 by Elwanda Brooklyn, RN Outcome: Completed/Met 12/13/2022 1456 by Elwanda Brooklyn, RN Outcome: Progressing   Problem: Self-Care: Goal: Ability to participate in self-care as condition permits will improve 12/13/2022 1457 by Elwanda Brooklyn, RN Outcome: Completed/Met 12/13/2022 1456 by Elwanda Brooklyn, RN Outcome: Progressing Goal: Verbalization of feelings and concerns over difficulty with self-care will improve 12/13/2022 1457 by Elwanda Brooklyn, RN Outcome: Completed/Met 12/13/2022 1456 by Elwanda Brooklyn, RN Outcome: Progressing Goal: Ability to communicate needs accurately will improve Outcome: Completed/Met   Problem: Nutrition: Goal: Risk of aspiration will decrease Outcome: Completed/Met Goal: Dietary intake will improve Outcome: Completed/Met   Problem: Education: Goal: Knowledge of General Education information will improve Description: Including pain rating scale, medication(s)/side effects and non-pharmacologic comfort measures Outcome: Completed/Met   Problem: Health Behavior/Discharge Planning: Goal: Ability to manage health-related needs will improve Outcome: Completed/Met   Problem: Clinical Measurements: Goal: Ability to maintain clinical measurements within normal limits will improve Outcome: Completed/Met Goal: Will remain free from infection Outcome: Completed/Met Goal: Diagnostic test results will improve Outcome: Completed/Met Goal: Respiratory complications will improve Outcome: Completed/Met Goal: Cardiovascular complication will be avoided Outcome: Completed/Met   Problem: Activity: Goal: Risk for activity intolerance will decrease Outcome: Completed/Met   Problem: Nutrition: Goal: Adequate nutrition will be maintained Outcome: Completed/Met   Problem: Coping: Goal: Level of anxiety will decrease Outcome: Completed/Met   Problem: Elimination: Goal: Will not experience complications related to bowel motility Outcome: Completed/Met Goal: Will not experience complications related to urinary retention Outcome: Completed/Met   Problem: Pain Management: Goal: General experience of comfort will improve Outcome: Completed/Met   Problem: Safety: Goal: Ability to remain free from injury will improve Outcome: Completed/Met   Problem: Skin Integrity: Goal: Risk for impaired skin integrity will decrease Outcome: Completed/Met

## 2022-12-13 NOTE — Interval H&P Note (Signed)
History and Physical Interval Note:  12/13/2022 11:01 AM  Russell Thomas  has presented today for surgery, with the diagnosis of CVA.  The various methods of treatment have been discussed with the patient and family. After consideration of risks, benefits and other options for treatment, the patient has consented to  Procedure(s): TRANSESOPHAGEAL ECHOCARDIOGRAM (N/A) as a surgical intervention.  The patient's history has been reviewed, patient examined, no change in status, stable for surgery.  I have reviewed the patient's chart and labs.  Questions were answered to the patient's satisfaction.    After careful review of history and examination, the risks, benefits of transesophageal echocardiogram, and alternatives have been explained to the patient. Complications include but not limited to esophageal perforation (rare), gastrointestinal bleeding (rare), cardiac arrhythmia which can include cardiac arrest and death (rare), pharyngeal irritation / discomfort with swallowing / hematoma, methemoglobinemia, bronchospasm, transient hypoxia, nonsustained ventricular tachycardia, transient atrial fibrillation, minimal hemoptysis, vomiting, hypotension, respiratory compromise, reaction to medications, unavoidable damage to teeth and gums, aspiration pneumonia  were reviewed with the patient.  Patient voices understands, provides verbal feedback, questions answered, and patient wishes to proceed with the procedure.  [name], DO, [facility]  [facility]  [address] [place], [place] [address]

## 2022-12-13 NOTE — Progress Notes (Addendum)
Mobility Specialist Progress Note;   12/13/22 0935  Mobility  Activity Ambulated independently in hallway  Level of Assistance Independent  Assistive Device None  Distance Ambulated (ft) 550 ft  Activity Response Tolerated well  Mobility Referral Yes  $Mobility charge 1 Mobility  Mobility Specialist Start Time (ACUTE ONLY) 0935  Mobility Specialist Stop Time (ACUTE ONLY) 0945  Mobility Specialist Time Calculation (min) (ACUTE ONLY) 10 min   Pt agreeable to mobility. Required no physical assistance during ambulation. Was pleased to be able to go out in hall and walk more. C/o having a small headache but stated it was from stress. Displayed slight fatigue post ambulation, stated it was from the long distance. Pt back in room with all needs met. Son in room.   Caesar Bookman Mobility Specialist Please contact via SecureChat or Rehab Office [phone]

## 2022-12-14 ENCOUNTER — Other Ambulatory Visit: Payer: Self-pay | Admitting: Neurology

## 2022-12-14 ENCOUNTER — Encounter (HOSPITAL_COMMUNITY): Payer: Self-pay | Admitting: Cardiology

## 2022-12-14 DIAGNOSIS — Q2112 Patent foramen ovale: Secondary | ICD-10-CM

## 2022-12-16 NOTE — Anesthesia Postprocedure Evaluation (Signed)
Anesthesia Post Note  Patient: VARIAN WIND  Procedure(s) Performed: TRANSESOPHAGEAL ECHOCARDIOGRAM     Patient location during evaluation: Cath Lab Anesthesia Type: MAC Level of consciousness: awake and alert Pain management: pain level controlled Vital Signs Assessment: post-procedure vital signs reviewed and stable Respiratory status: spontaneous breathing, nonlabored ventilation and respiratory function stable Cardiovascular status: stable and blood pressure returned to baseline Postop Assessment: no apparent nausea or vomiting Anesthetic complications: no   No notable events documented.  Last Vitals:  Vitals:   12/13/22 1326 12/13/22 1350  BP:  135/87  Pulse: 75 74  Resp: 15 18  Temp: 36.6 C (!) 36.4 C  SpO2: 98%                  Alette Kataoka

## 2022-12-20 ENCOUNTER — Ambulatory Visit: Payer: No Typology Code available for payment source | Admitting: Neurology

## 2022-12-20 ENCOUNTER — Encounter: Payer: Self-pay | Admitting: Neurology

## 2022-12-20 ENCOUNTER — Ambulatory Visit (INDEPENDENT_AMBULATORY_CARE_PROVIDER_SITE_OTHER): Payer: PRIVATE HEALTH INSURANCE | Admitting: Neurology

## 2022-12-20 VITALS — BP 128/84 | HR 80 | Ht 73.0 in | Wt 302.0 lb

## 2022-12-20 DIAGNOSIS — I639 Cerebral infarction, unspecified: Secondary | ICD-10-CM | POA: Diagnosis not present

## 2022-12-20 NOTE — Patient Instructions (Signed)
I had a long d/w patient about his recent stroke, risk for recurrent stroke/TIAs, personally independently reviewed imaging studies and stroke evaluation results and answered questions.Continue aspirin 81 mg daily and clopidogrel 75 mg daily  for 1 more week and then stop clopidogrel and stay on aspirin alone secondary stroke prevention and maintain strict control of hypertension with blood pressure goal below 130/90, diabetes with hemoglobin A1c goal below 6.5% and lipids with LDL cholesterol goal below 70 mg/dL. I also advised the patient to eat a healthy diet with plenty of whole grains, cereals, fruits and vegetables, exercise regularly and maintain ideal body weight .keep his appointments for lower extremity venous Dopplers as well as transcranial Doppler bubble study and 30-day cardiac monitoring for A-fib.  Followup in the future with my nurse practitioner in 3 months or call earlier if necessary.  Stroke Prevention Some medical conditions and behaviors can lead to a higher chance of having a stroke. You can help prevent a stroke by eating healthy, exercising, not smoking, and managing any medical conditions you have. Stroke is a leading cause of functional impairment. Primary prevention is particularly important because a majority of strokes are first-time events. Stroke changes the lives of not only those who experience a stroke but also their family and other caregivers. How can this condition affect me? A stroke is a medical emergency and should be treated right away. A stroke can lead to brain damage and can sometimes be life-threatening. If a person gets medical treatment right away, there is a better chance of surviving and recovering from a stroke. What can increase my risk? The following medical conditions may increase your risk of a stroke: Cardiovascular disease. High blood pressure (hypertension). Diabetes. High cholesterol. Sickle cell disease. Blood clotting disorders  (hypercoagulable state). Obesity. Sleep disorders (obstructive sleep apnea). Other risk factors include: Being older than age 43. Having a history of blood clots, stroke, or mini-stroke (transient ischemic attack, TIA). Genetic factors, such as race, ethnicity, or a family history of stroke. Smoking cigarettes or using other tobacco products. Taking birth control pills, especially if you also use tobacco. Heavy use of alcohol or drugs, especially cocaine and methamphetamine. Physical inactivity. What actions can I take to prevent this? Manage your health conditions High cholesterol levels. Eating a healthy diet is important for preventing high cholesterol. If cholesterol cannot be managed through diet alone, you may need to take medicines. Take any prescribed medicines to control your cholesterol as told by your health care provider. Hypertension. To reduce your risk of stroke, try to keep your blood pressure below 130/80. Eating a healthy diet and exercising regularly are important for controlling blood pressure. If these steps are not enough to manage your blood pressure, you may need to take medicines. Take any prescribed medicines to control hypertension as told by your health care provider. Ask your health care provider if you should monitor your blood pressure at home. Have your blood pressure checked every year, even if your blood pressure is normal. Blood pressure increases with age and some medical conditions. Diabetes. Eating a healthy diet and exercising regularly are important parts of managing your blood sugar (glucose). If your blood sugar cannot be managed through diet and exercise, you may need to take medicines. Take any prescribed medicines to control your diabetes as told by your health care provider. Get evaluated for obstructive sleep apnea. Talk to your health care provider about getting a sleep evaluation if you snore a lot or have excessive sleepiness. Make  sure that  any other medical conditions you have, such as atrial fibrillation or atherosclerosis, are managed. Nutrition Follow instructions from your health care provider about what to eat or drink to help manage your health condition. These instructions may include: Reducing your daily calorie intake. Limiting how much salt (sodium) you use to 1,500 milligrams (mg) each day. Using only healthy fats for cooking, such as olive oil, canola oil, or sunflower oil. Eating healthy foods. You can do this by: Choosing foods that are high in fiber, such as whole grains, and fresh fruits and vegetables. Eating at least 5 servings of fruits and vegetables a day. Try to fill one-half of your plate with fruits and vegetables at each meal. Choosing lean protein foods, such as lean cuts of meat, poultry without skin, fish, tofu, beans, and nuts. Eating low-fat dairy products. Avoiding foods that are high in sodium. This can help lower blood pressure. Avoiding foods that have saturated fat, trans fat, and cholesterol. This can help prevent high cholesterol. Avoiding processed and prepared foods. Counting your daily carbohydrate intake.  Lifestyle If you drink alcohol: Limit how much you have to: 0-1 drink a day for women who are not pregnant. 0-2 drinks a day for men. Know how much alcohol is in your drink. In the [place] one drink equals one 12 oz bottle of beer ( ), one 5 oz glass of wine ( ), or one 1 oz glass of hard liquor (44mL). Do not use any products that contain nicotine or tobacco. These products include cigarettes, chewing tobacco, and vaping devices, such as e-cigarettes. If you need help quitting, ask your health care provider. Avoid secondhand smoke. Do not use drugs. Activity  Try to stay at a healthy weight. Get at least 30 minutes of exercise on most days, such as: Fast walking. Biking. Swimming. Medicines Take over-the-counter and prescription medicines only as told by your health  care provider. Aspirin or blood thinners (antiplatelets or anticoagulants) may be recommended to reduce your risk of forming blood clots that can lead to stroke. Avoid taking birth control pills. Talk to your health care provider about the risks of taking birth control pills if: You are over 59 years old. You smoke. You get very bad headaches. You have had a blood clot. Where to find more information American Stroke Association: www.strokeassociation.org Get help right away if: You or a loved one has any symptoms of a stroke. "BE FAST" is an easy way to remember the main warning signs of a stroke: B - Balance. Signs are dizziness, sudden trouble walking, or loss of balance. E - Eyes. Signs are trouble seeing or a sudden change in vision. F - Face. Signs are sudden weakness or numbness of the face, or the face or eyelid drooping on one side. A - Arms. Signs are weakness or numbness in an arm. This happens suddenly and usually on one side of the body. S - Speech. Signs are sudden trouble speaking, slurred speech, or trouble understanding what people say. T - Time. Time to call emergency services. Write down what time symptoms started. You or a loved one has other signs of a stroke, such as: A sudden, severe headache with no known cause. Nausea or vomiting. Seizure. These symptoms may represent a serious problem that is an emergency. Do not wait to see if the symptoms will go away. Get medical help right away. Call your local emergency services (911 in the [place] Do not drive yourself to the hospital. Summary You can  help to prevent a stroke by eating healthy, exercising, not smoking, limiting alcohol intake, and managing any medical conditions you may have. Do not use any products that contain nicotine or tobacco. These include cigarettes, chewing tobacco, and vaping devices, such as e-cigarettes. If you need help quitting, ask your health care provider. Remember "BE FAST" for warning signs of  a stroke. Get help right away if you or a loved one has any of these signs. This information is not intended to replace advice given to you by your health care provider. Make sure you discuss any questions you have with your health care provider. Document Revised: 12/25/2021 Document Reviewed: 12/25/2021 Elsevier Patient Education  2024 [employer].

## 2022-12-20 NOTE — Progress Notes (Signed)
[facility] [address] [place]. [place] [address] [phone]       OFFICE FOLLOW-UP NOTE  Mr. Russell Thomas Date of Birth:  1960-05-10 Medical Record Number:  [id number]   HPI: Mr. Tritten is a pleasant 62 year old Caucasian male seen today for office follow-up visit following hospital consultation for stroke in November 2024.  History is obtained from the patient and review of electronic medical records.  I have personally reviewed pertinent available imaging films in PACS.  He has past medical history of hyperlipidemia, sleep apnea, prediabetes and diverticulosis. He presented on 12/12/2022 for evaluation for an episode of transient aphasia lasting only 10 seconds followed several hours later with right leg weakness and heaviness and dragging while walking.  Patient was talking to a customer on the phone and felt for about 10 seconds he was unable to find the right words and speak.  He got up from the chair and started walking and then noticed his right leg was heavy and he was dragging it.  This lasted about an hour.  He took a nap and when he woke up he had significantly improved and symptoms resolved.  He went and saw his primary care physician who advised to go to the hospital for evaluation.  CT head on admission showed no acute abnormality.  MRI scan showed 2 tiny punctate acute infarcts involving posterior left frontal cortex.  MR angiogram of the brain showed no large vessel stenosis or occlusion.  Carotid ultrasound showed no significant extracranial stenosis.  2D echo showed ejection fraction of 60 to 65% with mild left ventricular hypertrophy.  Transesophageal echocardiogram showed no cardiac source of embolism but was positive for PFO.  Unfortunately the size of the PFO and presence or absence of atrial septal aneurysm has not been mentioned in the report.  LDL cholesterol was 100 mg percent.  Hemoglobin A1c was 5.8.  Lower extremity venous Dopplers and transcranial  Doppler bubble study were ordered but have not yet been scheduled.  Patient was discharged on dual antiplatelet therapy and is tolerating aspirin and Plavix well without bruising or bleeding.  He has not had any recurrent stroke or TIA symptoms.  He states that only occasionally he feels slight heaviness of his right leg particularly when he is tired towards the end of the day.  He does have sleep apnea and has been quite compliant with using his CPAP.  His blood pressure has been slightly elevated and he has increased the dose of losartan to 50 mg daily which is tolerating well.  He does state that he was under significant stress at the time of the stroke and wonders if this had anything to do with his stroke.  30-day heart monitor has been ordered but he has not received it yet. ROS:   14 system review of systems is positive for lightheadedness, leg heaviness, tiredness, headaches all other systems negative  PMH:  Past Medical History:  Diagnosis Date   Adenomatous colon polyp 2008   Dr Arlyce Dice, GI   Allergic rhinitis    Anxiety    Diverticulosis    Gallbladder attack 2015   stones present   Gastritis    Hyperlipemia 2009   IBS (irritable bowel syndrome)    Internal hemorrhoids 2012   banded    Lichen planus    Pre-diabetes    Prostate cancer (HCC)    Seasonal allergies    Sleep apnea    [facility]    Social History:  Social History   Socioeconomic History   Marital status: Married    Spouse name: Not on file   Number of children: 1   Years of education: Not on file   Highest education level: Not on file  Occupational History   Occupation: Investment banker, corporate: [employer]   Tobacco Use   Smoking status: Former    Types: Cigars   Smokeless tobacco: Never   Tobacco comments:     RARE ; <1 -2 cigar / YEAR  Vaping Use   Vaping status: Never Used  Substance and Sexual Activity   Alcohol use: Yes    Comment: rare ; 0-1 drink / month   Drug use: No   Sexual  activity: Yes  Other Topics Concern   Not on file  Social History Narrative   Married, lives with spouse Lawson Fiscal- Pharm D) and 1 son    Work in Airline pilot   Wears his seatbelt, 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       Social Determinants of Health   Financial Resource Strain: Low Risk  (01/08/2022)   Overall Financial Resource Strain (CARDIA)    Difficulty of Paying Living Expenses: Not hard at all  Food Insecurity: No Food Insecurity (12/12/2022)   Hunger Vital Sign    Worried About Running Out of Food in the Last Year: Never true    Ran Out of Food in the Last Year: Never true  Transportation Needs: No Transportation Needs (12/12/2022)   PRAPARE - Administrator, Civil Service (Medical): No    Lack of Transportation (Non-Medical): No  Physical Activity: Not on file  Stress: Not on file  Social Connections: Unknown (06/18/2021)   [facility] from [facility], [facility]   Social Network    Social Network: Not on file  Intimate Partner Violence: Not At Risk (12/12/2022)   Humiliation, Afraid, Rape, and Kick questionnaire    Fear of Current or Ex-Partner: No    Emotionally Abused: No    Physically Abused: No    Sexually Abused: No    Medications:   Current Outpatient Medications on File Prior to Visit  Medication Sig Dispense Refill   acetaminophen (TYLENOL) 650 MG CR tablet Take 650 mg by mouth every 8 (eight) hours as needed for pain.     aspirin EC 81 MG tablet Take 1 tablet (81 mg total) by mouth daily. Swallow whole. 30 tablet 12   baclofen (LIORESAL) 10 MG tablet Take 1 tablet (10 mg total) by mouth 3 (three) times daily. (Patient taking differently: Take 10 mg by mouth at bedtime as needed for muscle spasms (alleviate sleep issues).) 30 each 0   Brimonidine Tartrate (LUMIFY) 0.025 % SOLN Place 1 drop into both eyes daily.     carboxymethylcellulose 1 % ophthalmic solution Apply 1 drop to eye as needed (Dry eyes, allergies).     clopidogrel (PLAVIX) 75 MG tablet Take 1  tablet (75 mg total) by mouth daily. 21 tablet 0   hydrocortisone cream 1 % Apply 1 Application topically 2 (two) times daily as needed for itching (Rashes).     ibuprofen (ADVIL) 200 MG tablet Take 200 mg by mouth every 6 (six) hours as needed for headache or mild pain (pain score 1-3).     loratadine (CLARITIN) 10 MG tablet Take 10 mg by mouth daily.     LORazepam (ATIVAN) 1 MG tablet Take 1 mg by mouth at bedtime as needed.     losartan (COZAAR) 25 MG  tablet Take 25 mg by mouth daily.     mometasone (NASONEX) 50 MCG/ACT nasal spray Place 1 spray into the nose as needed (Allergies).     Multiple Vitamins-Minerals (CENTRUM SILVER PO) Take 1 tablet by mouth once a week.     naproxen sodium (ALEVE) 220 MG tablet Take 440 mg by mouth daily as needed (Joint Pain).     rosuvastatin (CRESTOR) 20 MG tablet Take 1 tablet (20 mg total) by mouth daily. 30 tablet 2   tiZANidine (ZANAFLEX) 4 MG tablet Take 4 mg by mouth at bedtime as needed for muscle spasms (Alleviate sleep issues).     traZODone (DESYREL) 50 MG tablet Take 1-2 tablets (50-100 mg total) by mouth at bedtime as needed for sleep. 30 tablet 3   triamcinolone (NASACORT ALLERGY 24HR) 55 MCG/ACT AERO nasal inhaler Place 1 spray into the nose as needed (Seasonal allergies).     No current facility-administered medications on file prior to visit.    Allergies:   Allergies  Allergen Reactions   Cephalosporins Hives and Rash   Ciprofloxacin Rash   Iodinated Contrast Media Hives, Itching and Rash    GADOLINIUM   Omeprazole Rash   Penicillins Itching, Rash and Other (See Comments)    Weezing, blisters Has patient had a PCN reaction causing immediate rash, facial/tongue/throat swelling, SOB or lightheadedness with hypotension: Yes Has patient had a PCN reaction causing severe rash involving mucus membranes or skin necrosis: No Has patient had a PCN reaction that required hospitalization: No Has patient had a PCN reaction occurring within the  last 10 years: No If all of the above answers are "NO", then may proceed with Cephalosporin use.    Shrimp [Shellfish Allergy] Rash   Sulfa Antibiotics Hives and Rash   Sulfonamide Derivatives Rash   Omnipaque [Iohexol] Hives and Itching    Scratchy palms, scratchy throat, face and chest redness, hives all over. (CT fluoroscopic Contrast)      Physical Exam General: Obese middle-aged Caucasian male, seated, in no evident distress Head: head normocephalic and atraumatic.  Neck: supple with no carotid or supraclavicular bruits Cardiovascular: regular rate and rhythm, no murmurs Musculoskeletal: no deformity Skin:  no rash/petichiae Vascular:  Normal pulses all extremities Vitals:   12/20/22 0843  BP: 128/84  Pulse: 80   Neurologic Exam Mental Status: Awake and fully alert. Oriented to place and time. Recent and remote memory intact. Attention span, concentration and fund of knowledge appropriate. Mood and affect appropriate.  Cranial Nerves: Fundoscopic exam reveals sharp disc margins. Pupils equal, briskly reactive to light. Extraocular movements full without nystagmus. Visual fields full to confrontation. Hearing intact. Facial sensation intact. Face, tongue, palate moves normally and symmetrically.  Motor: Normal bulk and tone. Normal strength in all tested extremity muscles. Sensory.: intact to touch ,pinprick .position and vibratory sensation.  Coordination: Rapid alternating movements normal in all extremities. Finger-to-nose and heel-to-shin performed accurately bilaterally. Gait and Station: Arises from chair without difficulty. Stance is normal. Gait demonstrates normal stride length and balance . Able to heel, toe and tandem walk without difficulty.  Reflexes: 1+ and symmetric. Toes downgoing.   NIHSS  0 Modified Rankin  1   ASSESSMENT: 62 year old Caucasian male with embolic left frontal MCA branch infarct of cryptogenic etiology in November 2024.  Vascular risk factors  of sleep apnea, hyperlipidemia, PFO and obesity.     PLAN: had a long d/w patient about his recent stroke, risk for recurrent stroke/TIAs, personally independently reviewed imaging studies and stroke evaluation  results and answered questions.Continue aspirin 81 mg daily and clopidogrel 75 mg daily  for 1 more week and then stop clopidogrel and stay on aspirin alone secondary stroke prevention and maintain strict control of hypertension with blood pressure goal below 130/90, diabetes with hemoglobin A1c goal below 6.5% and lipids with LDL cholesterol goal below 70 mg/dL. I also advised the patient to eat a healthy diet with plenty of whole grains, cereals, fruits and vegetables, exercise regularly and maintain ideal body weight .keep his appointments for lower extremity venous Dopplers as well as transcranial Doppler bubble study and 30-day cardiac monitoring for A-fib.  His ROPE score is only 4 and has only 38% chance that his stroke is related to the PFO but if he has any high risk features may reconsider.  Followup in the future with my nurse practitioner in 3 months or call earlier if necessary.  Greater than 50% of time during this 35 minute visit was spent on counseling,explanation of diagnosis of cryptogenic stroke, PFO, planning of further management, discussion with patient and family and coordination of care Delia Heady, MD Note: This document was prepared with digital dictation and possible smart phrase technology. Any transcriptional errors that result from this process are unintentional

## 2022-12-24 DIAGNOSIS — I639 Cerebral infarction, unspecified: Secondary | ICD-10-CM | POA: Diagnosis not present

## 2023-01-01 ENCOUNTER — Ambulatory Visit (HOSPITAL_COMMUNITY)
Admission: RE | Admit: 2023-01-01 | Discharge: 2023-01-01 | Disposition: A | Discharge status: Home / Self Care | Payer: PRIVATE HEALTH INSURANCE | Source: Ambulatory Visit | Attending: Neurology | Admitting: Neurology

## 2023-01-01 ENCOUNTER — Ambulatory Visit (HOSPITAL_BASED_OUTPATIENT_CLINIC_OR_DEPARTMENT_OTHER)
Admission: RE | Admit: 2023-01-01 | Discharge: 2023-01-01 | Disposition: A | Discharge status: Home / Self Care | Payer: PRIVATE HEALTH INSURANCE | Source: Ambulatory Visit | Attending: Neurology | Admitting: Neurology

## 2023-01-01 DIAGNOSIS — Q2112 Patent foramen ovale: Secondary | ICD-10-CM

## 2023-01-06 NOTE — Progress Notes (Signed)
Kindly inform the patient that lower extremity venous Dopplers were both negative

## 2023-01-07 ENCOUNTER — Telehealth: Payer: Self-pay

## 2023-01-07 NOTE — Telephone Encounter (Signed)
Called patient to informed him of his Doppler results, lower extremity venous Dopplers were both negative.  Patient has asked about his results of the PFO (patient foramen ovale). I mention I would sent this note off and give the patient call back.  Pt verbalized understanding.

## 2023-01-07 NOTE — Telephone Encounter (Signed)
-----   Message from Russell Thomas sent at 01/06/2023  5:54 PM EST ----- Kindly inform the patient that lower extremity venous Dopplers were both negative

## 2023-01-24 ENCOUNTER — Encounter: Payer: Self-pay | Admitting: Cardiovascular Disease

## 2023-01-24 ENCOUNTER — Ambulatory Visit: Payer: Managed Care, Other (non HMO) | Attending: Cardiovascular Disease | Admitting: Cardiovascular Disease

## 2023-01-24 VITALS — BP 120/90 | HR 83 | Ht 73.0 in | Wt 305.4 lb

## 2023-01-24 DIAGNOSIS — Q2112 Patent foramen ovale: Secondary | ICD-10-CM | POA: Diagnosis not present

## 2023-01-24 DIAGNOSIS — I10 Essential (primary) hypertension: Secondary | ICD-10-CM

## 2023-01-24 DIAGNOSIS — I253 Aneurysm of heart: Secondary | ICD-10-CM | POA: Diagnosis not present

## 2023-01-24 NOTE — Patient Instructions (Signed)
Follow-Up: At [facility], you and your health needs are our priority.  As part of our continuing mission to provide you with exceptional heart care, we have created designated Provider Care Teams.  These Care Teams include your primary Cardiologist (physician) and Advanced Practice Providers (APPs -  Physician Assistants and Nurse Practitioners) who all work together to provide you with the care you need, when you need it.  We recommend signing up for the patient portal called "MyChart".  Sign up information is provided on this After Visit Summary.  MyChart is used to connect with patients for Virtual Visits (Telemedicine).  Patients are able to view lab/test results, encounter notes, upcoming appointments, etc.  Non-urgent messages can be sent to your provider as well.   To learn more about what you can do with MyChart, go to [email or web address].    Your next appointment:   As needed  Provider:   Tonny Bollman, MD

## 2023-01-24 NOTE — Progress Notes (Signed)
Cardiology Office Note:    Date:  01/24/2023   ID:  Russell Thomas, DOB 08-26-1960, MRN [id number]  PCP:  Alysia Penna, MD   [facility] Providers Cardiologist:  None     Referring MD: Alysia Penna, MD   Chief Complaint  Patient presents with   PFO Consult    History of Present Illness:    Russell Thomas is a 62 y.o. male presenting for evaluation of PFO, referred by Dr Pearlean Brownie.  The patient was in his normal state of health when he developed transient aphasia for just a few seconds followed by right leg weakness and gait instability.  He did not seek immediate medical attention but went to his primary care doctor and then was sent to the emergency room.  An MRI of the brain showed to punctate infarcts in the left frontal cortex.  Testing in the hospital showed no evidence of large vessel vascular disease.  2D echo showed normal LVEF and no valvular disease.  TEE showed PFO with atrial septal aneurysm.  TCD bubble study showed Spencer grade 4 shunt at rest and Spencer grade 5 shunt with Valsalva, felt to represent a moderate to large PFO.  The patient is here alone today.  He is doing well with no chest pain, shortness of breath, or heart palpitations.  He has completed 3 weeks of clopidogrel and he remains on aspirin 81 mg daily.  He reports that he was not taking aspirin prior to his stroke.  He had been under a lot of stress with his wife's medical illness.  Since his stroke, he has made a lot of good lifestyle changes.  He is walking on a treadmill regularly and he has changed his diet and lost about 10 pounds.  He feels well and has no other complaints.  He has no past history of neurologic symptoms.   Current Medications: Current Meds  Medication Sig   acetaminophen (TYLENOL) 650 MG CR tablet Take 650 mg by mouth every 8 (eight) hours as needed for pain.   aspirin EC 81 MG tablet Take 1 tablet (81 mg total) by mouth daily. Swallow whole.   Brimonidine Tartrate  (LUMIFY) 0.025 % SOLN Place 1 drop into both eyes daily.   cyclobenzaprine (FLEXERIL) 10 MG tablet 10 mg as needed.   hydrocortisone cream 1 % Apply 1 Application topically 2 (two) times daily as needed for itching (Rashes).   ibuprofen (ADVIL) 200 MG tablet Take 200 mg by mouth every 6 (six) hours as needed for headache or mild pain (pain score 1-3).   loratadine (CLARITIN) 10 MG tablet Take 10 mg by mouth daily.   LORazepam (ATIVAN) 1 MG tablet Take 1 mg by mouth at bedtime as needed.   losartan (COZAAR) 50 MG tablet Take 50 mg by mouth daily.   mometasone (NASONEX) 50 MCG/ACT nasal spray Place 1 spray into the nose as needed (Allergies).   Multiple Vitamins-Minerals (CENTRUM SILVER PO) Take 1 tablet by mouth once a week.   naproxen sodium (ALEVE) 220 MG tablet Take 440 mg by mouth daily as needed (Joint Pain).   rosuvastatin (CRESTOR) 20 MG tablet Take 1 tablet (20 mg total) by mouth daily.   traMADol (ULTRAM) 50 MG tablet Take 50 mg by mouth as needed.   traZODone (DESYREL) 50 MG tablet Take 1-2 tablets (50-100 mg total) by mouth at bedtime as needed for sleep.   triamcinolone (NASACORT ALLERGY 24HR) 55 MCG/ACT AERO nasal inhaler Place 1 spray into the  nose as needed (Seasonal allergies).   [DISCONTINUED] baclofen (LIORESAL) 10 MG tablet Take 1 tablet (10 mg total) by mouth 3 (three) times daily. (Patient taking differently: Take 10 mg by mouth at bedtime as needed for muscle spasms (alleviate sleep issues).)   [DISCONTINUED] carboxymethylcellulose 1 % ophthalmic solution Apply 1 drop to eye as needed (Dry eyes, allergies).   [DISCONTINUED] clopidogrel (PLAVIX) 75 MG tablet Take 1 tablet (75 mg total) by mouth daily.   [DISCONTINUED] losartan (COZAAR) 25 MG tablet Take 25 mg by mouth daily.   [DISCONTINUED] tiZANidine (ZANAFLEX) 4 MG tablet Take 4 mg by mouth at bedtime as needed for muscle spasms (Alleviate sleep issues).     Allergies:   Cephalosporins, Ciprofloxacin, Iodinated contrast  media, Omeprazole, Penicillins, Shrimp [shellfish allergy], Sulfa antibiotics, Sulfonamide derivatives, and Omnipaque [iohexol]   ROS:   Please see the history of present illness.    All other systems reviewed and are negative.  EKGs/Labs/Other Studies Reviewed:    The following studies were reviewed today: Cardiac Studies & Procedures      ECHOCARDIOGRAM  ECHOCARDIOGRAM COMPLETE 12/12/2022  Narrative ECHOCARDIOGRAM REPORT    Patient Name:   Russell Thomas Date of Exam: 12/12/2022 Medical Rec #:  [id number]       Height:       73.0 in Accession #:    [id number]      Weight:       313.9 lb Date of Birth:  1960-04-05       BSA:          2.606 m Patient Age:    62 years        BP:           125/64 mmHg Patient Gender: M               HR:           90 bpm. Exam Location:  Inpatient  Procedure: 2D Echo, Cardiac Doppler and Color Doppler  Indications:    TIA  History:        Patient has prior history of Echocardiogram examinations, most recent 04/23/2013. Stroke; Risk Factors:Hypertension and Sleep Apnea.  Sonographer:    Karma Ganja Referring Phys: [id number] TIMOTHY S OPYD   Sonographer Comments: Patient is obese. IMPRESSIONS   1. Technically difficult study. Left ventricular ejection fraction, by estimation, is 60 to 65%. The left ventricle has normal function. Left ventricular endocardial border not optimally defined to evaluate regional wall motion. There is mild left ventricular hypertrophy. Left ventricular diastolic parameters were normal. 2. Right ventricular systolic function is normal. The right ventricular size is normal. Tricuspid regurgitation signal is inadequate for assessing PA pressure. 3. The mitral valve is normal in structure. Trivial mitral valve regurgitation. 4. The aortic valve was not well visualized. Aortic valve regurgitation is not visualized. No aortic stenosis is present.  FINDINGS Left Ventricle: Left ventricular ejection fraction, by estimation,  is 60 to 65%. The left ventricle has normal function. Left ventricular endocardial border not optimally defined to evaluate regional wall motion. The left ventricular internal cavity size was normal in size. There is mild left ventricular hypertrophy. Left ventricular diastolic parameters were normal.  Right Ventricle: The right ventricular size is normal. Right vetricular wall thickness was not well visualized. Right ventricular systolic function is normal. Tricuspid regurgitation signal is inadequate for assessing PA pressure.  Left Atrium: Left atrial size was normal in size.  Right Atrium: Right atrial size was normal in size.  Pericardium:  There is no evidence of pericardial effusion. Presence of epicardial fat layer.  Mitral Valve: The mitral valve is normal in structure. Trivial mitral valve regurgitation.  Tricuspid Valve: The tricuspid valve is normal in structure. Tricuspid valve regurgitation is trivial.  Aortic Valve: The aortic valve was not well visualized. Aortic valve regurgitation is not visualized. No aortic stenosis is present. Aortic valve mean gradient measures 5.0 mmHg. Aortic valve peak gradient measures 8.8 mmHg. Aortic valve area, by VTI measures 3.24 cm.  Pulmonic Valve: The pulmonic valve was not well visualized. Pulmonic valve regurgitation is not visualized.  Aorta: The aortic root and ascending aorta are structurally normal, with no evidence of dilitation.  IAS/Shunts: The interatrial septum was not well visualized.   LEFT VENTRICLE PLAX 2D LVIDd:         4.90 cm   Diastology LVIDs:         3.40 cm   LV e' medial:    6.96 cm/s LV PW:         1.10 cm   LV E/e' medial:  8.4 LV IVS:        1.10 cm   LV e' lateral:   13.40 cm/s LVOT diam:     2.20 cm   LV E/e' lateral: 4.4 LV SV:         79 LV SV Index:   30 LVOT Area:     3.80 cm   RIGHT VENTRICLE RV Basal diam:  3.70 cm RV S prime:     16.40 cm/s TAPSE (M-mode): 2.3 cm  LEFT ATRIUM              Index        RIGHT ATRIUM           Index LA diam:        3.90 cm 1.50 cm/m   RA Area:     14.10 cm LA Vol (A2C):   60.1 ml 23.06 ml/m  RA Volume:   28.10 ml  10.78 ml/m LA Vol (A4C):   47.7 ml 18.30 ml/m LA Biplane Vol: 55.7 ml 21.37 ml/m AORTIC VALVE AV Area (Vmax):    3.06 cm AV Area (Vmean):   2.93 cm AV Area (VTI):     3.24 cm AV Vmax:           148.00 cm/s AV Vmean:          97.500 cm/s AV VTI:            0.244 m AV Peak Grad:      8.8 mmHg AV Mean Grad:      5.0 mmHg LVOT Vmax:         119.00 cm/s LVOT Vmean:        75.100 cm/s LVOT VTI:          0.208 m LVOT/AV VTI ratio: 0.85  AORTA Ao Root diam: 3.30 cm Ao Asc diam:  3.70 cm  MITRAL VALVE MV Area (PHT): 4.83 cm    SHUNTS MV Decel Time: 157 msec    Systemic VTI:  0.21 m MV E velocity: 58.30 cm/s  Systemic Diam: 2.20 cm MV A velocity: 81.80 cm/s MV E/A ratio:  0.71  Epifanio Lesches MD Electronically signed by Epifanio Lesches MD Signature Date/Time: 12/12/2022/5:06:44 PM    Final  TEE  ECHO TEE 12/13/2022  Narrative TRANSESOPHOGEAL ECHO REPORT    Patient Name:   Russell Thomas Date of Exam: 12/13/2022 Medical Rec #:  [id number]  Height:       73.0 in Accession #:    [id number]      Weight:       313.9 lb Date of Birth:  1960-09-07       BSA:          2.606 m Patient Age:    62 years        BP:           162/107 mmHg Patient Gender: M               HR:           86 bpm. Exam Location:  Inpatient  Procedure: Transesophageal Echo, Cardiac Doppler and Color Doppler  Indications:    PFO  History:        Patient has prior history of Echocardiogram examinations.  Sonographer:    Dondra Prader RVT RCS Referring Phys: [id number] Perlie Gold  PROCEDURE: The transesophogeal probe was passed without difficulty through the esophogus of the patient. Sedation performed by different physician. The patient's vital signs; including heart rate, blood pressure, and oxygen saturation; remained  stable throughout the procedure. Supplementary images were obtained from transthoracic windows as indicated to answer the clinical question. The patient developed no complications during the procedure.  IMPRESSIONS   1. Left ventricular ejection fraction, by estimation, is 60 to 65%. The left ventricle has normal function. The left ventricle has no regional wall motion abnormalities. 2. Right ventricular systolic function is normal. The right ventricular size is normal. 3. No left atrial/left atrial appendage thrombus was detected. The LAA emptying velocity was 44 cm/s. 4. The mitral valve is normal in structure. Trivial mitral valve regurgitation. No evidence of mitral stenosis. 5. The aortic valve is tricuspid. Aortic valve regurgitation is mild. No aortic stenosis is present. 6. There is mild dilatation of the ascending aorta, measuring 40 mm. 7. Evidence of atrial level shunting detected by color flow Doppler. Agitated saline contrast bubble study was positive with shunting observed within 3-6 cardiac cycles suggestive of interatrial shunt.  Conclusion(s)/Recommendation(s): Findings are concerning for an interatrial shunt as detailed above.  FINDINGS Left Ventricle: Left ventricular ejection fraction, by estimation, is 60 to 65%. The left ventricle has normal function. The left ventricle has no regional wall motion abnormalities. The left ventricular internal cavity size was normal in size. There is no left ventricular hypertrophy.  Right Ventricle: The right ventricular size is normal. Right vetricular wall thickness was not assessed. Right ventricular systolic function is normal.  Left Atrium: Left atrial size was normal in size. No left atrial/left atrial appendage thrombus was detected. The LAA emptying velocity was 44 cm/s.  Right Atrium: Right atrial size was normal in size.  Pericardium: There is no evidence of pericardial effusion.  Mitral Valve: The mitral valve is normal in  structure. Trivial mitral valve regurgitation. No evidence of mitral valve stenosis.  Tricuspid Valve: The tricuspid valve is normal in structure. Tricuspid valve regurgitation is not demonstrated. No evidence of tricuspid stenosis.  Aortic Valve: The aortic valve is tricuspid. Aortic valve regurgitation is mild. No aortic stenosis is present.  Pulmonic Valve: The pulmonic valve was normal in structure. Pulmonic valve regurgitation is not visualized. No evidence of pulmonic stenosis.  Aorta: The aortic root is normal in size and structure. There is mild dilatation of the ascending aorta, measuring 40 mm.  Venous: The right upper pulmonary vein, right lower pulmonary vein and left upper pulmonary vein are normal.  IAS/Shunts: There is redundancy  of the interatrial septum. Evidence of atrial level shunting detected by color flow Doppler. Agitated saline contrast was given intravenously to evaluate for intracardiac shunting. Agitated saline contrast bubble study was positive with shunting observed within 3-6 cardiac cycles suggestive of interatrial shunt.  Additional Comments: Spectral Doppler performed.  LEFT VENTRICLE PLAX 2D LVOT diam:     2.50 cm LVOT Area:     4.91 cm    AORTA Ao Root diam: 3.60 cm Ao Asc diam:  4.00 cm   SHUNTS Systemic Diam: 2.50 cm  Sunit Tolia Electronically signed by [name] Signature Date/Time: 12/13/2022/3:53:22 PM    Final            EKG:   EKG Interpretation Date/Time:  Thursday January 24 2023 11:17:35 EST Ventricular Rate:  83 PR Interval:  162 QRS Duration:  112 QT Interval:  388 QTC Calculation: 455 R Axis:   -15  Text Interpretation: Normal sinus rhythm Normal ECG When compared with ECG of 11-Dec-2022 13:45, Nonspecific T wave abnormality has replaced inverted T waves in Inferior leads Confirmed by Tonny Bollman [phone]) on 01/24/2023 11:38:03 AM    Recent Labs: 12/11/2022: ALT 31 12/13/2022: BUN 12; Creatinine, Ser 1.01;  Hemoglobin 14.8; Platelets 195; Potassium 5.2; Sodium 138  Recent Lipid Panel    Component Value Date/Time   CHOL 174 12/12/2022 0156   TRIG 152 (H) 12/12/2022 0156   HDL 44 12/12/2022 0156   CHOLHDL 4.0 12/12/2022 0156   VLDL 30 12/12/2022 0156   LDLCALC 100 (H) 12/12/2022 0156           Physical Exam:    VS:  BP (!) 120/90   Pulse 83   Ht 6\' 1"  (1.854 m)   Wt (!) 305 lb 6.4 oz (138.5 kg)   SpO2 96%   BMI 40.29 kg/m     Wt Readings from Last 3 Encounters:  01/24/23 (!) 305 lb 6.4 oz (138.5 kg)  12/20/22 (!) 302 lb (137 kg)  12/11/22 (!) 313 lb 15 oz (142.4 kg)     GEN:  Well nourished, well developed in no acute distress HEENT: Normal NECK: No JVD; No carotid bruits LYMPHATICS: No lymphadenopathy CARDIAC: RRR, no murmurs, rubs, gallops RESPIRATORY:  Clear to auscultation without rales, wheezing or rhonchi  ABDOMEN: Soft, non-tender, non-distended MUSCULOSKELETAL:  No edema; No deformity  SKIN: Warm and dry NEUROLOGIC:  Alert and oriented x 3 PSYCHIATRIC:  Normal affect   Assessment & Plan PFO with atrial septal aneurysm I have personally reviewed the patient's TEE images.  He has a moderate-sized PFO with atrial septal aneurysm and positive right-to-left shunt with both color-flow and agitated saline confirming the presence of a PFO.  We reviewed the association between PFO and cryptogenic stroke and we also discussed the available clinical trial data supporting transcatheter PFO closure for secondary risk reduction of stroke.  For this patients PFO, there is a moderate likelihood of contribution to stroke/TIA with a RoPE score of 4 to 6  Above score calculated as 1 point each if NOT present [HTN, DM2, h/o CVA/TIA, Smoker, Cortical imaging on infarct] Above score calculated as 5 points if [Age 16 to 29], 4 points if [Age 62 to 39], 3 points if [Age 39 to 49], 2 points if [Age 33 to 59], 1 point if [Age 22 to 69], 0 points if [Age > 70]  RoPE score is total sum of  individual points (higher total is LESS risk of stroke)   The patient will continue on  his current medical therapy and he understands the need to continue aspirin 81 mg daily for antiplatelet therapy.  I will review the results of his 30-day event monitor when it is available (he mailed it in yesterday).  He would like to further consider his treatment options and I will discuss PFO closure again with him when I call him back after the first of the year with the event monitor results.  He understands that in this situation we do our best to make an educated guess whether the PFO is "pathogenic" or not, but more importantly whether it could significantly reduce his risk of stroke in the future.  I demonstrated the Amplatzer PFO occluder device with him, reviewed the procedure, and specifically discussed procedural risks today.  If he ultimately decides on medical therapy and lifestyle modification, I would be happy to see him back in the future if any further problems arise. Primary hypertension Continue current therapy, continue lifestyle modification.            Medication Adjustments/Labs and Tests Ordered: Current medicines are reviewed at length with the patient today.  Concerns regarding medicines are outlined above.  Orders Placed This Encounter  Procedures   EKG 12-Lead   No orders of the defined types were placed in this encounter.   Patient Instructions  Follow-Up: At [facility], you and your health needs are our priority.  As part of our continuing mission to provide you with exceptional heart care, we have created designated Provider Care Teams.  These Care Teams include your primary Cardiologist (physician) and Advanced Practice Providers (APPs -  Physician Assistants and Nurse Practitioners) who all work together to provide you with the care you need, when you need it.  We recommend signing up for the patient portal called "MyChart".  Sign up information is provided  on this After Visit Summary.  MyChart is used to connect with patients for Virtual Visits (Telemedicine).  Patients are able to view lab/test results, encounter notes, upcoming appointments, etc.  Non-urgent messages can be sent to your provider as well.   To learn more about what you can do with MyChart, go to [email or web address].    Your next appointment:   As needed  Provider:   Tonny Bollman, MD         Signed, Tonny Bollman, MD  01/24/2023 12:10 PM    [facility]

## 2023-01-24 NOTE — Assessment & Plan Note (Signed)
Continue current therapy, continue lifestyle modification.

## 2023-02-04 ENCOUNTER — Ambulatory Visit: Payer: PRIVATE HEALTH INSURANCE | Attending: Internal Medicine

## 2023-02-04 DIAGNOSIS — I639 Cerebral infarction, unspecified: Secondary | ICD-10-CM

## 2023-02-11 ENCOUNTER — Ambulatory Visit: Payer: PRIVATE HEALTH INSURANCE | Admitting: Internal Medicine

## 2023-02-12 ENCOUNTER — Ambulatory Visit: Payer: PRIVATE HEALTH INSURANCE | Admitting: Neurology

## 2023-02-15 ENCOUNTER — Telehealth: Payer: Self-pay | Admitting: Cardiovascular Disease

## 2023-02-15 NOTE — Telephone Encounter (Signed)
 Patient would like to know if Dr. Excell Seltzer can review his monitor results and provide feedback. Please advise.

## 2023-02-15 NOTE — Telephone Encounter (Signed)
 Spoke with patient, made aware of recent normal monitor. Patient wanted to check with Dr Excell Seltzer on his next steps in his plan of care.

## 2023-03-01 ENCOUNTER — Encounter: Payer: Self-pay | Admitting: Cardiovascular Disease

## 2023-03-01 ENCOUNTER — Ambulatory Visit: Payer: PRIVATE HEALTH INSURANCE | Attending: Cardiovascular Disease | Admitting: Cardiovascular Disease

## 2023-03-01 VITALS — BP 126/82 | HR 104 | Ht 73.0 in | Wt 310.0 lb

## 2023-03-01 DIAGNOSIS — I10 Essential (primary) hypertension: Secondary | ICD-10-CM | POA: Diagnosis not present

## 2023-03-01 DIAGNOSIS — Q2112 Patent foramen ovale: Secondary | ICD-10-CM

## 2023-03-01 DIAGNOSIS — E782 Mixed hyperlipidemia: Secondary | ICD-10-CM | POA: Diagnosis not present

## 2023-03-01 DIAGNOSIS — I253 Aneurysm of heart: Secondary | ICD-10-CM | POA: Diagnosis not present

## 2023-03-01 NOTE — Assessment & Plan Note (Signed)
Treated with rosuvastatin 20 mg daily.  His baseline LDL cholesterol was 100.  He is due for follow-up labs and I will place orders for a lipid panel and complete metabolic panel.

## 2023-03-01 NOTE — Progress Notes (Signed)
Cardiology Office Note:    Date:  03/01/2023   ID:  Russell Thomas, DOB 22-Nov-1960, MRN [id number]  PCP:  Alysia Penna, MD   [facility] Providers Cardiologist:  None     Referring MD: Alysia Penna, MD   Chief Complaint  Patient presents with   Follow-up    PFO    History of Present Illness:    Russell Thomas is a 63 y.o. male presenting for follow-up of PFO.  I saw the patient initially on January 24, 2023 when he was referred by Dr. Pearlean Brownie for evaluation of PFO.  The patient had a cryptogenic stroke confirmed by MRI with 2 punctate infarcts in the left frontal cortex.  He was found to have no evidence of large vessel vascular disease.  His echocardiogram showed normal ejection fraction and no valvular disease.  TEE showed PFO with atrial septal aneurysm and transcranial Doppler bubble study showed Spencer grade 4 shunt at rest and Spencer grade 5 shunt with Valsalva.  At the time of his initial evaluation, he was undergoing a 30-day event monitor and this was yet to be resulted.  He presents today for follow-up discussion.  His event monitor showed no evidence of atrial fibrillation or flutter.  The patient reports no change in his symptoms.  He is doing fine with no chest pain, heart palpitations, or shortness of breath.  He has made some positive lifestyle changes and feels well overall.   Current Medications: Current Meds  Medication Sig   acetaminophen (TYLENOL) 650 MG CR tablet Take 650 mg by mouth every 8 (eight) hours as needed for pain.   aspirin EC 81 MG tablet Take 1 tablet (81 mg total) by mouth daily. Swallow whole.   Brimonidine Tartrate (LUMIFY) 0.025 % SOLN Place 1 drop into both eyes daily.   cyclobenzaprine (FLEXERIL) 10 MG tablet 10 mg as needed.   hydrocortisone cream 1 % Apply 1 Application topically 2 (two) times daily as needed for itching (Rashes).   ibuprofen (ADVIL) 200 MG tablet Take 200 mg by mouth every 6 (six) hours as needed for  headache or mild pain (pain score 1-3).   loratadine (CLARITIN) 10 MG tablet Take 10 mg by mouth daily.   LORazepam (ATIVAN) 1 MG tablet Take 1 mg by mouth at bedtime as needed.   losartan (COZAAR) 50 MG tablet Take 50 mg by mouth daily.   mometasone (NASONEX) 50 MCG/ACT nasal spray Place 1 spray into the nose as needed (Allergies).   Multiple Vitamins-Minerals (CENTRUM SILVER PO) Take 1 tablet by mouth once a week.   naproxen sodium (ALEVE) 220 MG tablet Take 440 mg by mouth daily as needed (Joint Pain).   rosuvastatin (CRESTOR) 20 MG tablet Take 1 tablet (20 mg total) by mouth daily.   traMADol (ULTRAM) 50 MG tablet Take 50 mg by mouth as needed.   traZODone (DESYREL) 50 MG tablet Take 1-2 tablets (50-100 mg total) by mouth at bedtime as needed for sleep.   triamcinolone (NASACORT ALLERGY 24HR) 55 MCG/ACT AERO nasal inhaler Place 1 spray into the nose as needed (Seasonal allergies).     Allergies:   Cephalosporins, Ciprofloxacin, Iodinated contrast media, Omeprazole, Penicillins, Shrimp [shellfish allergy], Sulfa antibiotics, Sulfonamide derivatives, and Omnipaque [iohexol]   ROS:   Please see the history of present illness.    All other systems reviewed and are negative.  EKGs/Labs/Other Studies Reviewed:    The following studies were reviewed today: Cardiac Studies & Procedures  ECHOCARDIOGRAM  ECHOCARDIOGRAM COMPLETE 12/12/2022  Narrative ECHOCARDIOGRAM REPORT    Patient Name:   Russell Thomas Date of Exam: 12/12/2022 Medical Rec #:  [id number]       Height:       73.0 in Accession #:    [id number]      Weight:       313.9 lb Date of Birth:  10-10-1960       BSA:          2.606 m Patient Age:    63 years        BP:           125/64 mmHg Patient Gender: M               HR:           90 bpm. Exam Location:  Inpatient  Procedure: 2D Echo, Cardiac Doppler and Color Doppler  Indications:    TIA  History:        Patient has prior history of Echocardiogram examinations,  most recent 04/23/2013. Stroke; Risk Factors:Hypertension and Sleep Apnea.  Sonographer:    Karma Ganja Referring Phys: [id number] TIMOTHY S OPYD   Sonographer Comments: Patient is obese. IMPRESSIONS   1. Technically difficult study. Left ventricular ejection fraction, by estimation, is 60 to 65%. The left ventricle has normal function. Left ventricular endocardial border not optimally defined to evaluate regional wall motion. There is mild left ventricular hypertrophy. Left ventricular diastolic parameters were normal. 2. Right ventricular systolic function is normal. The right ventricular size is normal. Tricuspid regurgitation signal is inadequate for assessing PA pressure. 3. The mitral valve is normal in structure. Trivial mitral valve regurgitation. 4. The aortic valve was not well visualized. Aortic valve regurgitation is not visualized. No aortic stenosis is present.  FINDINGS Left Ventricle: Left ventricular ejection fraction, by estimation, is 60 to 65%. The left ventricle has normal function. Left ventricular endocardial border not optimally defined to evaluate regional wall motion. The left ventricular internal cavity size was normal in size. There is mild left ventricular hypertrophy. Left ventricular diastolic parameters were normal.  Right Ventricle: The right ventricular size is normal. Right vetricular wall thickness was not well visualized. Right ventricular systolic function is normal. Tricuspid regurgitation signal is inadequate for assessing PA pressure.  Left Atrium: Left atrial size was normal in size.  Right Atrium: Right atrial size was normal in size.  Pericardium: There is no evidence of pericardial effusion. Presence of epicardial fat layer.  Mitral Valve: The mitral valve is normal in structure. Trivial mitral valve regurgitation.  Tricuspid Valve: The tricuspid valve is normal in structure. Tricuspid valve regurgitation is trivial.  Aortic Valve: The aortic  valve was not well visualized. Aortic valve regurgitation is not visualized. No aortic stenosis is present. Aortic valve mean gradient measures 5.0 mmHg. Aortic valve peak gradient measures 8.8 mmHg. Aortic valve area, by VTI measures 3.24 cm.  Pulmonic Valve: The pulmonic valve was not well visualized. Pulmonic valve regurgitation is not visualized.  Aorta: The aortic root and ascending aorta are structurally normal, with no evidence of dilitation.  IAS/Shunts: The interatrial septum was not well visualized.   LEFT VENTRICLE PLAX 2D LVIDd:         4.90 cm   Diastology LVIDs:         3.40 cm   LV e' medial:    6.96 cm/s LV PW:         1.10 cm   LV E/e' medial:  8.4 LV IVS:        1.10 cm   LV e' lateral:   13.40 cm/s LVOT diam:     2.20 cm   LV E/e' lateral: 4.4 LV SV:         79 LV SV Index:   30 LVOT Area:     3.80 cm   RIGHT VENTRICLE RV Basal diam:  3.70 cm RV S prime:     16.40 cm/s TAPSE (M-mode): 2.3 cm  LEFT ATRIUM             Index        RIGHT ATRIUM           Index LA diam:        3.90 cm 1.50 cm/m   RA Area:     14.10 cm LA Vol (A2C):   60.1 ml 23.06 ml/m  RA Volume:   28.10 ml  10.78 ml/m LA Vol (A4C):   47.7 ml 18.30 ml/m LA Biplane Vol: 55.7 ml 21.37 ml/m AORTIC VALVE AV Area (Vmax):    3.06 cm AV Area (Vmean):   2.93 cm AV Area (VTI):     3.24 cm AV Vmax:           148.00 cm/s AV Vmean:          97.500 cm/s AV VTI:            0.244 m AV Peak Grad:      8.8 mmHg AV Mean Grad:      5.0 mmHg LVOT Vmax:         119.00 cm/s LVOT Vmean:        75.100 cm/s LVOT VTI:          0.208 m LVOT/AV VTI ratio: 0.85  AORTA Ao Root diam: 3.30 cm Ao Asc diam:  3.70 cm  MITRAL VALVE MV Area (PHT): 4.83 cm    SHUNTS MV Decel Time: 157 msec    Systemic VTI:  0.21 m MV E velocity: 58.30 cm/s  Systemic Diam: 2.20 cm MV A velocity: 81.80 cm/s MV E/A ratio:  0.71  Epifanio Lesches MD Electronically signed by Epifanio Lesches MD Signature Date/Time:  12/12/2022/5:06:44 PM    Final  TEE  ECHO TEE 12/13/2022  Narrative TRANSESOPHOGEAL ECHO REPORT    Patient Name:   Russell Thomas Date of Exam: 12/13/2022 Medical Rec #:  [id number]       Height:       73.0 in Accession #:    [id number]      Weight:       313.9 lb Date of Birth:  1960-08-14       BSA:          2.606 m Patient Age:    63 years        BP:           162/107 mmHg Patient Gender: M               HR:           86 bpm. Exam Location:  Inpatient  Procedure: Transesophageal Echo, Cardiac Doppler and Color Doppler  Indications:    PFO  History:        Patient has prior history of Echocardiogram examinations.  Sonographer:    Dondra Prader RVT RCS Referring Phys: [id number] Perlie Gold  PROCEDURE: The transesophogeal probe was passed without difficulty through the esophogus of the patient. Sedation performed by different physician. The patient's vital signs;  including heart rate, blood pressure, and oxygen saturation; remained stable throughout the procedure. Supplementary images were obtained from transthoracic windows as indicated to answer the clinical question. The patient developed no complications during the procedure.  IMPRESSIONS   1. Left ventricular ejection fraction, by estimation, is 60 to 65%. The left ventricle has normal function. The left ventricle has no regional wall motion abnormalities. 2. Right ventricular systolic function is normal. The right ventricular size is normal. 3. No left atrial/left atrial appendage thrombus was detected. The LAA emptying velocity was 44 cm/s. 4. The mitral valve is normal in structure. Trivial mitral valve regurgitation. No evidence of mitral stenosis. 5. The aortic valve is tricuspid. Aortic valve regurgitation is mild. No aortic stenosis is present. 6. There is mild dilatation of the ascending aorta, measuring 40 mm. 7. Evidence of atrial level shunting detected by color flow Doppler. Agitated saline contrast bubble study  was positive with shunting observed within 3-6 cardiac cycles suggestive of interatrial shunt.  Conclusion(s)/Recommendation(s): Findings are concerning for an interatrial shunt as detailed above.  FINDINGS Left Ventricle: Left ventricular ejection fraction, by estimation, is 60 to 65%. The left ventricle has normal function. The left ventricle has no regional wall motion abnormalities. The left ventricular internal cavity size was normal in size. There is no left ventricular hypertrophy.  Right Ventricle: The right ventricular size is normal. Right vetricular wall thickness was not assessed. Right ventricular systolic function is normal.  Left Atrium: Left atrial size was normal in size. No left atrial/left atrial appendage thrombus was detected. The LAA emptying velocity was 44 cm/s.  Right Atrium: Right atrial size was normal in size.  Pericardium: There is no evidence of pericardial effusion.  Mitral Valve: The mitral valve is normal in structure. Trivial mitral valve regurgitation. No evidence of mitral valve stenosis.  Tricuspid Valve: The tricuspid valve is normal in structure. Tricuspid valve regurgitation is not demonstrated. No evidence of tricuspid stenosis.  Aortic Valve: The aortic valve is tricuspid. Aortic valve regurgitation is mild. No aortic stenosis is present.  Pulmonic Valve: The pulmonic valve was normal in structure. Pulmonic valve regurgitation is not visualized. No evidence of pulmonic stenosis.  Aorta: The aortic root is normal in size and structure. There is mild dilatation of the ascending aorta, measuring 40 mm.  Venous: The right upper pulmonary vein, right lower pulmonary vein and left upper pulmonary vein are normal.  IAS/Shunts: There is redundancy of the interatrial septum. Evidence of atrial level shunting detected by color flow Doppler. Agitated saline contrast was given intravenously to evaluate for intracardiac shunting. Agitated saline contrast  bubble study was positive with shunting observed within 3-6 cardiac cycles suggestive of interatrial shunt.  Additional Comments: Spectral Doppler performed.  LEFT VENTRICLE PLAX 2D LVOT diam:     2.50 cm LVOT Area:     4.91 cm    AORTA Ao Root diam: 3.60 cm Ao Asc diam:  4.00 cm   SHUNTS Systemic Diam: 2.50 cm  Sunit Tolia Electronically signed by [name] Signature Date/Time: 12/13/2022/3:53:22 PM    Final  MONITORS  CARDIAC EVENT MONITOR 02/04/2023  Narrative Sinus rhythm with minimal atrial/ventricular ectopy.  No significant tachyarrhythmia or bradyarrhythmia. No atrial fibrillation or flutter.           EKG:   EKG Interpretation Date/Time:  Friday March 01 2023 16:16:42 EST Ventricular Rate:  104 PR Interval:  160 QRS Duration:  98 QT Interval:  342 QTC Calculation: 449 R Axis:   -23  Text Interpretation: Sinus tachycardia When compared with ECG of 24-Jan-2023 11:17, No significant change was found Confirmed by Tonny Bollman [phone]) on 03/01/2023 5:12:20 PM    Recent Labs: 12/11/2022: ALT 31 12/13/2022: BUN 12; Creatinine, Ser 1.01; Hemoglobin 14.8; Platelets 195; Potassium 5.2; Sodium 138  Recent Lipid Panel    Component Value Date/Time   CHOL 174 12/12/2022 0156   TRIG 152 (H) 12/12/2022 0156   HDL 44 12/12/2022 0156   CHOLHDL 4.0 12/12/2022 0156   VLDL 30 12/12/2022 0156   LDLCALC 100 (H) 12/12/2022 0156     Risk Assessment/Calculations:                Physical Exam:    VS:  BP 126/82   Pulse (!) 104   Ht 6\' 1"  (1.854 m)   Wt (!) 310 lb (140.6 kg)   SpO2 95%   BMI 40.90 kg/m     Wt Readings from Last 3 Encounters:  03/01/23 (!) 310 lb (140.6 kg)  01/24/23 (!) 305 lb 6.4 oz (138.5 kg)  12/20/22 (!) 302 lb (137 kg)     GEN:  Well nourished, well developed in no acute distress HEENT: Normal NECK: No JVD; No carotid bruits LYMPHATICS: No lymphadenopathy CARDIAC: RRR, no murmurs, rubs, gallops RESPIRATORY:  Clear to  auscultation without rales, wheezing or rhonchi  ABDOMEN: Soft, non-tender, non-distended MUSCULOSKELETAL:  No edema; No deformity  SKIN: Warm and dry NEUROLOGIC:  Alert and oriented x 3 PSYCHIATRIC:  Normal affect   Assessment & Plan Primary hypertension Blood pressure controlled on losartan.  Continue to work on weight loss, regular exercise, medication compliance, and periodic blood pressure monitoring. Hyperlipidemia, mixed Treated with rosuvastatin 20 mg daily.  His baseline LDL cholesterol was 100.  He is due for follow-up labs and I will place orders for a lipid panel and complete metabolic panel. PFO with atrial septal aneurysm Lengthy discussion today about his PFO and options for medical therapy versus device closure.  I again reviewed his transesophageal echo and discussed the fact that his PFO is at least moderate in size with an associated atrial septal aneurysm.  The patient's comorbid conditions include hypertension and morbid obesity.  He states that he was under a great deal of stress when he had a stroke and he thinks that his blood pressure was probably uncontrolled.  He was not taking aspirin at that time.  After a shared decision-making discussion today, the patient elects for continued medical therapy.  He would be a candidate for transcatheter PFO closure if he has recurrent stroke symptoms on background medical therapy.  He should continue aspirin lifelong and continue to work on lifestyle modification as discussed above.  Again, his outpatient event monitor is reviewed and demonstrated no evidence of atrial fibrillation.     Medication Adjustments/Labs and Tests Ordered: Current medicines are reviewed at length with the patient today.  Concerns regarding medicines are outlined above.  Orders Placed This Encounter  Procedures   Comprehensive metabolic panel   Lipid panel   EKG 12-Lead   No orders of the defined types were placed in this encounter.   Patient  Instructions  Lab Work: CMET Lipid Panel   If you have labs (blood work) drawn today and your tests are completely normal, you will receive your results only by: MyChart Message (if you have MyChart) OR A paper copy in the mail If you have any lab test that is abnormal or we need to change your treatment, we will call  you to review the results.   Follow-Up: At [facility], you and your health needs are our priority.  As part of our continuing mission to provide you with exceptional heart care, we have created designated Provider Care Teams.  These Care Teams include your primary Cardiologist (physician) and Advanced Practice Providers (APPs -  Physician Assistants and Nurse Practitioners) who all work together to provide you with the care you need, when you need it.   Your next appointment:   1 year(s)  Provider:   Dr. Excell Seltzer If Card or EP not listed click to update   DO NOT delete brackets or number around this link :1}   Other Instructions   1st Floor: - Lobby - Registration  - Pharmacy  - Lab - Cafe  2nd Floor: - PV Lab - Diagnostic Testing (echo, CT, nuclear med)  3rd Floor: - Vacant  4th Floor: - TCTS (cardiothoracic surgery) - AFib Clinic - Structural Heart Clinic - Vascular Surgery  - Vascular Ultrasound  5th Floor: - HeartCare Cardiology (general and EP) - Clinical Pharmacy for coumadin, hypertension, lipid, weight-loss medications, and med management appointments    Valet parking services will be available as well.          Signed, Tonny Bollman, MD  03/01/2023 5:15 PM    [facility]

## 2023-03-01 NOTE — Assessment & Plan Note (Signed)
Blood pressure controlled on losartan.  Continue to work on weight loss, regular exercise, medication compliance, and periodic blood pressure monitoring.

## 2023-03-01 NOTE — Patient Instructions (Signed)
Lab Work: CMET Lipid Panel   If you have labs (blood work) drawn today and your tests are completely normal, you will receive your results only by: MyChart Message (if you have MyChart) OR A paper copy in the mail If you have any lab test that is abnormal or we need to change your treatment, we will call you to review the results.   Follow-Up: At [facility], you and your health needs are our priority.  As part of our continuing mission to provide you with exceptional heart care, we have created designated Provider Care Teams.  These Care Teams include your primary Cardiologist (physician) and Advanced Practice Providers (APPs -  Physician Assistants and Nurse Practitioners) who all work together to provide you with the care you need, when you need it.   Your next appointment:   1 year(s)  Provider:   Dr. Excell Seltzer If Card or EP not listed click to update   DO NOT delete brackets or number around this link :1}   Other Instructions   1st Floor: - Lobby - Registration  - Pharmacy  - Lab - Cafe  2nd Floor: - PV Lab - Diagnostic Testing (echo, CT, nuclear med)  3rd Floor: - Vacant  4th Floor: - TCTS (cardiothoracic surgery) - AFib Clinic - Structural Heart Clinic - Vascular Surgery  - Vascular Ultrasound  5th Floor: - HeartCare Cardiology (general and EP) - Clinical Pharmacy for coumadin, hypertension, lipid, weight-loss medications, and med management appointments    Valet parking services will be available as well.

## 2023-03-07 LAB — LIPID PANEL
Chol/HDL Ratio: 2.9 {ratio} (ref 0.0–5.0)
Cholesterol, Total: 119 mg/dL (ref 100–199)
HDL: 41 mg/dL (ref >39)
LDL Chol Calc (NIH): 57 mg/dL (ref 0–99)
Triglycerides: 114 mg/dL (ref 0–149)
VLDL Cholesterol Cal: 21 mg/dL (ref 5–40)

## 2023-03-07 LAB — COMPREHENSIVE METABOLIC PANEL
ALT: 34 [IU]/L (ref 0–44)
AST: 21 [IU]/L (ref 0–40)
Albumin: 4.4 g/dL (ref 3.9–4.9)
Alkaline Phosphatase: 78 [IU]/L (ref 44–121)
BUN/Creatinine Ratio: 13 (ref 10–24)
BUN: 13 mg/dL (ref 8–27)
Bilirubin Total: 0.4 mg/dL (ref 0.0–1.2)
CO2: 23 mmol/L (ref 20–29)
Calcium: 9.4 mg/dL (ref 8.6–10.2)
Chloride: 104 mmol/L (ref 96–106)
Creatinine, Ser: 0.97 mg/dL (ref 0.76–1.27)
Globulin, Total: 2.2 g/dL (ref 1.5–4.5)
Glucose: 95 mg/dL (ref 70–99)
Potassium: 4.4 mmol/L (ref 3.5–5.2)
Sodium: 142 mmol/L (ref 134–144)
Total Protein: 6.6 g/dL (ref 6.0–8.5)
eGFR: 87 mL/min/{1.73_m2} (ref >59)

## 2023-03-13 ENCOUNTER — Other Ambulatory Visit (INDEPENDENT_AMBULATORY_CARE_PROVIDER_SITE_OTHER): Payer: PRIVATE HEALTH INSURANCE

## 2023-03-13 ENCOUNTER — Ambulatory Visit: Payer: PRIVATE HEALTH INSURANCE | Admitting: Orthopedic Surgery

## 2023-03-13 DIAGNOSIS — M79642 Pain in left hand: Secondary | ICD-10-CM

## 2023-03-14 ENCOUNTER — Encounter: Payer: Self-pay | Admitting: Orthopedic Surgery

## 2023-03-14 ENCOUNTER — Ambulatory Visit: Payer: PRIVATE HEALTH INSURANCE | Admitting: Neurology

## 2023-03-14 NOTE — Progress Notes (Signed)
 Office Visit Note   Patient: Russell Thomas           Date of Birth: 10-16-1960           MRN: [id number] Visit Date: 03/13/2023 Requested by: Larnell Hamilton, MD [address] [place],  [place] [address] PCP: Larnell Hamilton, MD  Subjective: Chief Complaint  Patient presents with   Neck - Pain   Middle Back - Pain    HPI: Russell Thomas is a 63 y.o. male who presents to the office reporting right sided arm neck and thoracic pain along with left wrist and thumb pain.  He describes mid upper back pain on the right-hand side for at least 2 years.  At times he feels like there is a knife going into his back on the right-hand side.  Does wake him from sleep at night and prevents him from sleeping.  Denies much in the way of radicular symptoms in either arm.  Does not describe any numbness and tingling.  Denies any decreased range of motion of the shoulder.  He has had workup for gallbladder which was negative.  Patient also reports left wrist and thumb pain.  Had a severe skiing injury about 45 years ago.  This is getting more painful the older he gets.  Does report having a minor stroke in November 2024..                ROS: All systems reviewed are negative as they relate to the chief complaint within the history of present illness.  Patient denies fevers or chills.  Assessment & Plan: Visit Diagnoses:  1. Pain in left hand     Plan: Impression is left wrist and thumb pain with diminished flexion of the MCP joint but stable MCP joint.  May have some arthritis as well at that [facility] joint.  Once this reaches the threshold for intervention I will have him see our hand surgeon.  The hand is something he can live with for now.  More pressing is this neck and cervical spine issue.  I think with the scapular pain involvement this could be coming from the neck.  Symptoms ongoing for 2 years with failure of conservative management.  Cervical spine MRI indicated to evaluate right-sided  radiculopathy.  He will follow-up with us  after that study.  Follow-Up Instructions: No follow-ups on file.   Orders:  Orders Placed This Encounter  Procedures   XR Hand Complete Left   No orders of the defined types were placed in this encounter.     Procedures: No procedures performed   Clinical Data: No additional findings.  Objective: Vital Signs: There were no vitals taken for this visit.  Physical Exam:  Constitutional: Patient appears well-developed HEENT:  Head: Normocephalic Eyes:EOM are normal Neck: Normal range of motion Cardiovascular: Normal rate Pulmonary/chest: Effort normal Neurologic: Patient is alert Skin: Skin is warm Psychiatric: Patient has normal mood and affect  Ortho Exam: Ortho exam demonstrates flexion about 2 inches chin to chest.  Extension is only about 20 to 25 degrees.  Rotation is about 50 degrees bilaterally.  No paresthesias C5-T1 on the right-hand side.  No muscle atrophy on the right.  Patient has 5 out of 5 grip EPL FPL interosseous resection extension bicep triceps and deltoid strength.  Left thumb is examined.  IP motion intact but MCP motion is diminished in extension and he only has about 20 degrees of flexion.  CMC grind test positive but no styloid tenderness  is present.  Wrist range of motion otherwise full.  Surgical incisions present near the styloid as well as near the MCP joint dorsally on that left thumb.  Specialty Comments:  No specialty comments available.  Imaging: No results found.   PMFS History: Patient Active Problem List   Diagnosis Date Noted   Obesity, Class III, BMI 40-49.9 (morbid obesity) (HCC) 12/12/2022   Acute ischemic stroke (HCC) 12/11/2022   Primary hypertension 10/16/2022   Neck pain 10/16/2022   Insomnia 10/16/2022   S/P laparoscopic hernia repair 07/18/2020   History of prostate cancer 02/14/2017   Abnormal SPEP 09/30/2015   Morbid obesity with BMI of 40.0-44.9, adult (HCC) 01/20/2014    Cholelithiasis 06/01/2013   Hx of lichen planus 11/14/2012   GERD (gastroesophageal reflux disease) 10/21/2012   Hyperlipidemia, mixed 06/08/2009   History of colonic polyps 06/08/2009   Obstructive sleep apnea 07/15/2007   Diverticulosis of colon 06/07/2007   Anxiety state 05/07/2007   Past Medical History:  Diagnosis Date   Adenomatous colon polyp 2008   Dr Debrah, GI   Allergic rhinitis    Anxiety    Diverticulosis    Gallbladder attack 2015   stones present   Gastritis    Hyperlipemia 2009   IBS (irritable bowel syndrome)    Internal hemorrhoids 2012   banded    Lichen planus    Pre-diabetes    Prostate cancer (HCC)    Seasonal allergies    Sleep apnea    [facility]    Family History  Problem Relation Age of Onset   Thyroid  disease Mother        hypothyroidism   Allergies Mother    Parkinson's disease Mother        not definite   Atrial fibrillation Mother    Colitis Father    Alcohol abuse Paternal Grandfather     Past Surgical History:  Procedure Laterality Date   BIOPSY PROSTATE  12/03/2016   COLONOSCOPY  04/10/2010, 08/12/2006   3/12 with banding; 2008: adenomatous polyp, diverticulosis   INCISIONAL HERNIA REPAIR N/A 07/18/2020   Procedure: LAPAROSCOPIC INCISIONAL HERNIA REPAIR WITH MESH;  Surgeon: Vernetta Berg, MD;  Location: [facility];  Service: General;  Laterality: N/A;   LYMPHADENECTOMY Bilateral 02/14/2017   Procedure: LYMPHADENECTOMY/PELVIC;  Surgeon: Renda Glance, MD;  Location: [facility];  Service: Urology;  Laterality: Bilateral;   ROBOT ASSISTED LAPAROSCOPIC RADICAL PROSTATECTOMY N/A 02/14/2017   Procedure: XI ROBOTIC ASSISTED LAPAROSCOPIC RADICAL PROSTATECTOMY LEVEL 3;  Surgeon: Renda Glance, MD;  Location: [facility];  Service: Urology;  Laterality: N/A;   SEPTOPLASTY     thumb surgery     TONSILLECTOMY     TRANSESOPHAGEAL ECHOCARDIOGRAM (CATH LAB) N/A 12/13/2022   Procedure: TRANSESOPHAGEAL ECHOCARDIOGRAM;  Surgeon: Michele Richardson,  DO;  Location: [facility];  Service: Cardiovascular;  Laterality: N/A;   UPPER GASTROINTESTINAL ENDOSCOPY  10/24/2001   gastritis   UPPER GASTROINTESTINAL ENDOSCOPY  11/03/12   Social History   Occupational History   Occupation: Investment Banker, [employer]: [employer]   Tobacco Use   Smoking status: Former    Types: Cigars   Smokeless tobacco: Never   Tobacco comments:     RARE ; <1 -2 cigar / YEAR  Vaping Use   Vaping status: Never Used  Substance and Sexual Activity   Alcohol use: Yes    Comment: rare ; 0-1 drink / month   Drug use: No   Sexual activity: Yes

## 2023-03-18 ENCOUNTER — Other Ambulatory Visit: Payer: Self-pay

## 2023-03-18 DIAGNOSIS — M5412 Radiculopathy, cervical region: Secondary | ICD-10-CM

## 2023-03-25 IMAGING — DX DG RIBS BILAT 3V
5 series · 5 of 5 positions shown · non-contrast
Comparison: 06/10/2015

CLINICAL DATA: Chest pain

EXAM:
BILATERAL RIBS - 3+ VIEW

[dg ribs bilateral (1 of 5)]
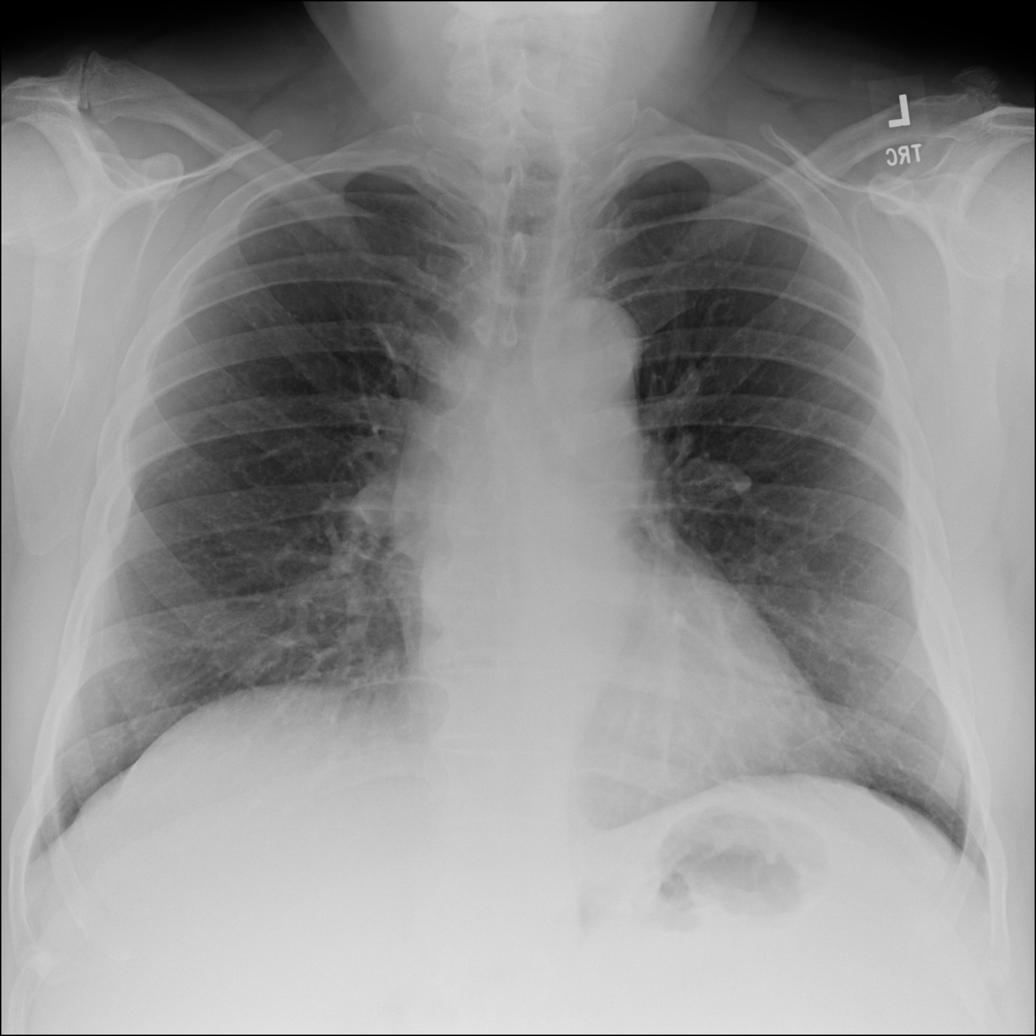

[dg ribs bilateral (2 of 5)]
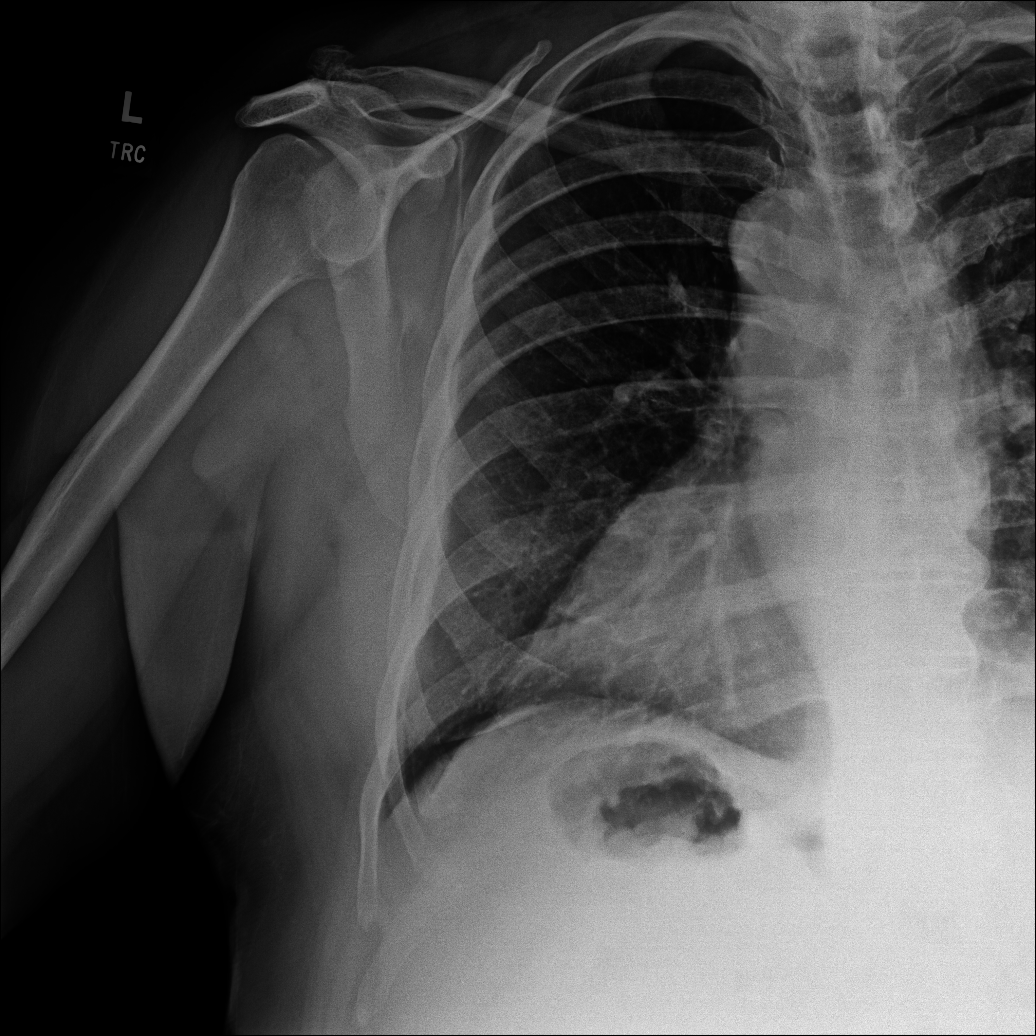

[dg ribs bilateral (3 of 5)]
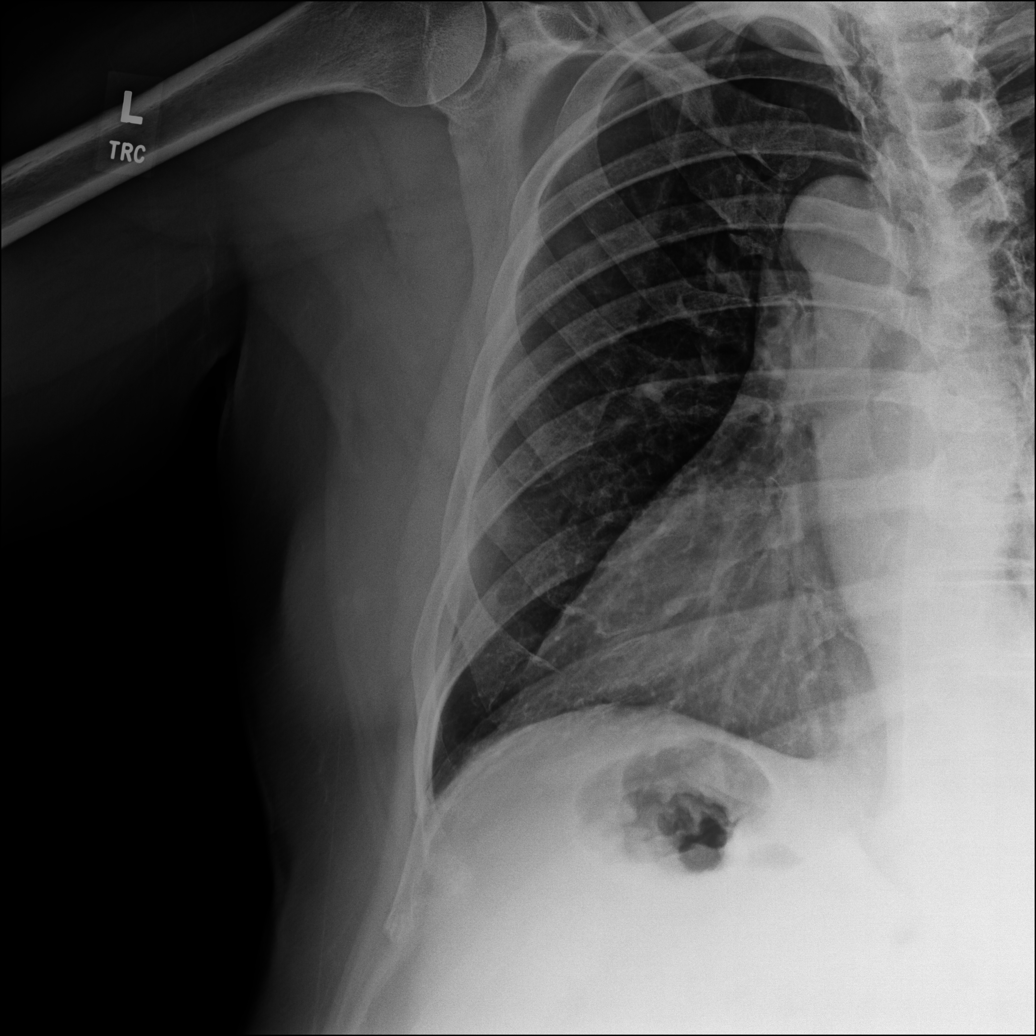

[dg ribs bilateral (4 of 5)]
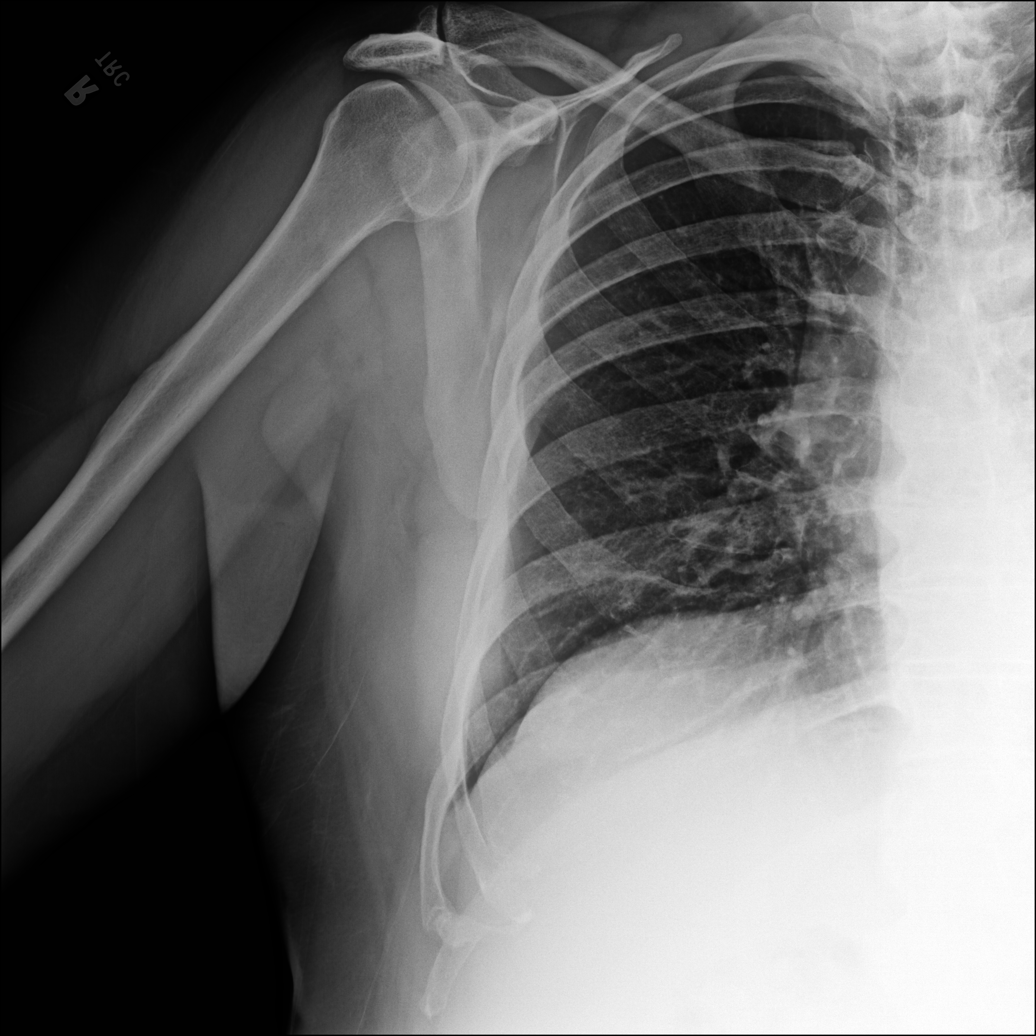

[dg ribs bilateral (5 of 5)]
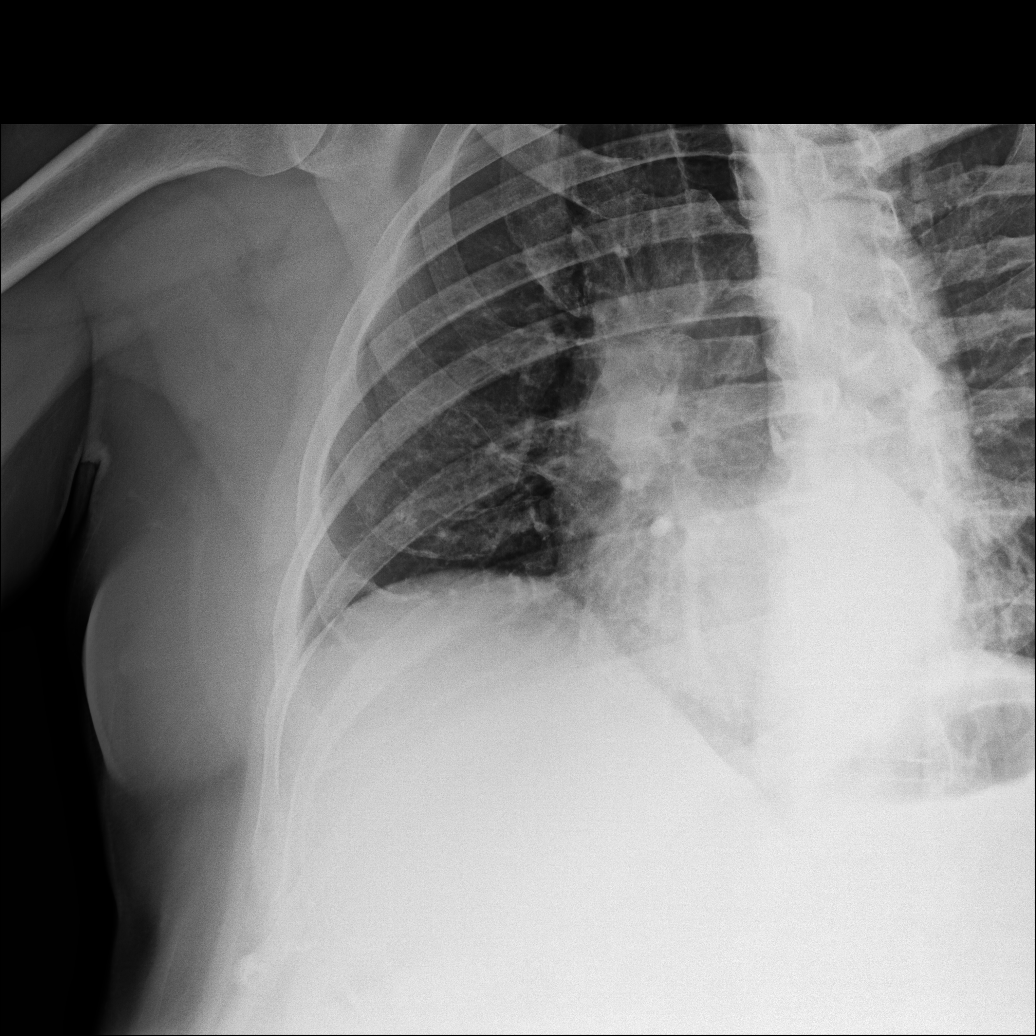

[5 of 5 positions shown; findings below may reference images not displayed]

FINDINGS: Cardiac size is within normal limits. Thoracic aorta is tortuous and
ectatic. Lung fields are clear of any infiltrates or pulmonary
edema. There is no pleural effusion or pneumothorax. No displaced
fractures seen. There are no focal lytic or sclerotic lesions.
IMPRESSION: No radiographic abnormality is seen in the bilateral ribs. There is
no focal pulmonary infiltrate. There is no pleural effusion or
pneumothorax.

## 2023-06-18 ENCOUNTER — Other Ambulatory Visit: Payer: Self-pay

## 2023-06-18 ENCOUNTER — Ambulatory Visit: Admitting: Physical Therapy

## 2023-06-18 ENCOUNTER — Ambulatory Visit: Payer: PRIVATE HEALTH INSURANCE | Attending: Student

## 2023-06-18 ENCOUNTER — Ambulatory Visit: Payer: PRIVATE HEALTH INSURANCE | Admitting: Physical Therapy

## 2023-06-18 DIAGNOSIS — R262 Difficulty in walking, not elsewhere classified: Secondary | ICD-10-CM | POA: Diagnosis present

## 2023-06-18 DIAGNOSIS — M6281 Muscle weakness (generalized): Secondary | ICD-10-CM | POA: Insufficient documentation

## 2023-06-18 DIAGNOSIS — R252 Cramp and spasm: Secondary | ICD-10-CM | POA: Diagnosis present

## 2023-06-18 DIAGNOSIS — M5459 Other low back pain: Secondary | ICD-10-CM | POA: Diagnosis present

## 2023-06-18 DIAGNOSIS — R293 Abnormal posture: Secondary | ICD-10-CM | POA: Insufficient documentation

## 2023-06-18 NOTE — Therapy (Signed)
 OUTPATIENT PHYSICAL THERAPY THORACOLUMBAR EVALUATION   Patient Name: Russell Thomas MRN: [id number] DOB:1960/11/29, 63 y.o., male Today's Date: 06/18/2023  END OF SESSION:  PT End of Session - 06/18/23 1618     Visit Number 1    Date for PT Re-Evaluation 08/13/23    Authorization Type Medicare    PT Start Time 1615    PT Stop Time 1656    PT Time Calculation (min) 41 min    Activity Tolerance Patient tolerated treatment well    Behavior During Therapy [facility] for tasks assessed/performed             Past Medical History:  Diagnosis Date   Adenomatous colon polyp 2008   Dr Arvie Latus, GI   Allergic rhinitis    Anxiety    Diverticulosis    Gallbladder attack 2015   stones present   Gastritis    Hyperlipemia 2009   IBS (irritable bowel syndrome)    Internal hemorrhoids 2012   banded    Lichen planus    Pre-diabetes    Prostate cancer (HCC)    Seasonal allergies    Sleep apnea    [facility]   Past Surgical History:  Procedure Laterality Date   BIOPSY PROSTATE  12/03/2016   COLONOSCOPY  04/10/2010, 08/12/2006   3/12 with banding; 2008: adenomatous polyp, diverticulosis   INCISIONAL HERNIA REPAIR N/A 07/18/2020   Procedure: LAPAROSCOPIC INCISIONAL HERNIA REPAIR WITH MESH;  Surgeon: Oza Blumenthal, MD;  Location: [facility];  Service: General;  Laterality: N/A;   LYMPHADENECTOMY Bilateral 02/14/2017   Procedure: LYMPHADENECTOMY/PELVIC;  Surgeon: Florencio Hunting, MD;  Location: [facility];  Service: Urology;  Laterality: Bilateral;   ROBOT ASSISTED LAPAROSCOPIC RADICAL PROSTATECTOMY N/A 02/14/2017   Procedure: XI ROBOTIC ASSISTED LAPAROSCOPIC RADICAL PROSTATECTOMY LEVEL 3;  Surgeon: Florencio Hunting, MD;  Location: [facility];  Service: Urology;  Laterality: N/A;   SEPTOPLASTY     thumb surgery     TONSILLECTOMY     TRANSESOPHAGEAL ECHOCARDIOGRAM (CATH LAB) N/A 12/13/2022   Procedure: TRANSESOPHAGEAL ECHOCARDIOGRAM;  Surgeon: Olinda Bertrand, DO;  [facility];  Service: Cardiovascular;  Laterality: N/A;   UPPER GASTROINTESTINAL ENDOSCOPY  10/24/2001   gastritis   UPPER GASTROINTESTINAL ENDOSCOPY  11/03/12   Patient Active Problem List   Diagnosis Date Noted   Obesity, Class III, BMI 40-49.9 (morbid obesity) 12/12/2022   Acute ischemic stroke (HCC) 12/11/2022   Primary hypertension 10/16/2022   Neck pain 10/16/2022   Insomnia 10/16/2022   S/P laparoscopic hernia repair 07/18/2020   History of prostate cancer 02/14/2017   Abnormal SPEP 09/30/2015   Morbid obesity with BMI of 40.0-44.9, adult (HCC) 01/20/2014   Cholelithiasis 06/01/2013   Hx of lichen planus 11/14/2012   GERD (gastroesophageal reflux disease) 10/21/2012   Hyperlipidemia, mixed 06/08/2009   History of colonic polyps 06/08/2009   Obstructive sleep apnea 07/15/2007   Diverticulosis of colon 06/07/2007   Anxiety state 05/07/2007    PCP: Barnetta Liberty, MD  REFERRING PROVIDER: Jeannette Mills, NP  REFERRING DIAG: [phone] (ICD-10-CM) - Spondylosis without myelopathy or radiculopathy, lumbar region  Rationale for Evaluation and Treatment: Rehabilitation  THERAPY DIAG:  Other low back pain - Plan: PT plan of care cert/re-cert  Muscle weakness (generalized) - Plan: PT plan of care cert/re-cert  Cramp and spasm - Plan: PT plan of care cert/re-cert  Abnormal posture - Plan: PT plan of care cert/re-cert  Difficulty in walking, not elsewhere classified - Plan: PT plan of care cert/re-cert  ONSET DATE: 06/18/2023  SUBJECTIVE:                                                                                                                                                                                           SUBJECTIVE STATEMENT: Patient reports recent hx of a tremendous amount of stress.  Wife has stage 4 cancer and  a job situation, traveling and having a recent stroke.  Cardiologist feels the stroke was primarily due to elevated blood pressure due to the  stress.  He feels most of his pain in left low back and gluteals but has experienced some symptoms recently in the right as well. 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  He does have optional standing desk.  Pain is worse with prolonged standing.    PERTINENT HISTORY:  Recent CVA but minimal long term effects or weakness on affected side.    PAIN:  Are you having pain? Yes: NPRS scale: 8/10 Pain location: low back and gluteals Left > Right Pain description: aching Aggravating factors: prolonged standing Relieving factors: lying down, heat  PRECAUTIONS: None  RED FLAGS: None   WEIGHT BEARING RESTRICTIONS: No  FALLS:  Has patient fallen in last 6 months? No  LIVING ENVIRONMENT: Lives with: lives with their spouse Lives in: House/apartment   OCCUPATION: desk job  PLOF: Independent, Independent with basic ADLs, Independent with household mobility without device, Independent with community mobility without device, Independent with homemaking with ambulation, Independent with gait, and Independent with transfers  PATIENT GOALS: to be able to exercise, lose weight, feel healthy and be able to do routine daily activities without pain.  NEXT MD VISIT: prn  OBJECTIVE:  Note: Objective measures were completed at Evaluation unless otherwise noted.  DIAGNOSTIC FINDINGS:  None on lumbar spine  PATIENT SURVEYS:  Modified Oswestry 11/50= 22%   COGNITION: Overall cognitive status: Within functional limits for tasks assessed     SENSATION: WFL  MUSCLE LENGTH: Hamstrings: Right 45 deg; Left 45 deg Thomas test: Right pos; Left pos  POSTURE: decreased lumbar lordosis   LUMBAR ROM:   AROM eval  Flexion Fingertips to upper shin  Extension 50% with sharp pain  Right lateral flexion Fingertips to joint line  Left lateral flexion Fingertips to joint line  Right rotation WFL  Left rotation WFL   (Blank rows = not  tested)  LOWER EXTREMITY ROM:     WFL  LOWER EXTREMITY MMT:    Generally 4 to 4+/5  LUMBAR SPECIAL TESTS:  Straight leg raise test: Negative  FUNCTIONAL TESTS:  5  times sit to stand: 16.40 sec pushing up on knees Timed up and go (TUG): 8.01 sec  GAIT: Distance walked: 30 feet Assistive device utilized: None Level of assistance: Complete Independence Comments: antalgic , slow, guarded  TREATMENT DATE: 06/18/23 Initial eval completed and initiated HEP    Educated on anatomy of the spine and behavior of the intervertebral discs with certain postures and body mechanics                                                                                                                              PATIENT EDUCATION:  Education details: See above Person educated: Patient Education method: Programmer, multimedia, Facilities manager, Verbal cues, and Handouts Education comprehension: verbalized understanding, returned demonstration, and verbal cues required  HOME EXERCISE PROGRAM: Access Code: [email or web address] URL: [email or web address] Date: 06/18/2023 Prepared by: Aletha Anderson  Exercises - Standing Hamstring Stretch on Chair  - 1 x daily - 7 x weekly - 1 sets - 3 reps - 30 sec hold - Quadricep Stretch with Chair and Counter Support  - 1 x daily - 7 x weekly - 1 sets - 3 reps - 30 sec hold - Seated Figure 4 Piriformis Stretch  - 1 x daily - 7 x weekly - 1 sets - 3 reps - 30 sed hold  ASSESSMENT:  CLINICAL IMPRESSION: Patient is a 63 y.o. male who was seen today for physical therapy evaluation and treatment for lumbar spondylosis. He presents with decreased ROM, strength and function along with elevated pain in the lumbar spine and gluteal areas.  He would benefit from skilled PT for LE flexibility, core strength, instruction in posture and body mechanics and pain control.     OBJECTIVE IMPAIRMENTS: decreased knowledge of condition, difficulty walking, decreased ROM, decreased strength,  increased fascial restrictions, increased muscle spasms, impaired flexibility, postural dysfunction, obesity, and pain.   ACTIVITY LIMITATIONS: carrying, lifting, bending, sitting, standing, squatting, sleeping, transfers, bed mobility, bathing, toileting, dressing, hygiene/grooming, and caring for others  PARTICIPATION LIMITATIONS: meal prep, cleaning, laundry, driving, shopping, community activity, occupation, and yard work  PERSONAL FACTORS: Fitness, Past/current experiences, Profession, and 3+ comorbidities: htn, recent CVA, obesity are also affecting patient's functional outcome.   REHAB POTENTIAL: Good  CLINICAL DECISION MAKING: Stable/uncomplicated  EVALUATION COMPLEXITY: Low   GOALS: Goals reviewed with patient? Yes  SHORT TERM GOALS: Target date: 07/16/2023  Pain report to be no greater than 4/10  Baseline: Goal status: INITIAL  2.  Patient will be independent with initial HEP  Baseline:  Goal status: INITIAL   LONG TERM GOALS: Target date: 08/13/2023  Patient to report pain no greater than 2/10  Baseline:  Goal status: INITIAL  2.  Patient to be independent with advanced HEP  Baseline:  Goal status: INITIAL  3.  ODI to improve to 14% or less Baseline:  Goal status: INITIAL  4.  Functional tests to improve by 2-3 seconds Baseline:  Goal status: INITIAL  5.  Patient to be  able to bend, stoop and squat with pain no greater than 2/10  Baseline:  Goal status: INITIAL  6.  Patient to be able to stand or walk for at least 15 min without leg pain  Baseline:  Goal status: INITIAL  PLAN:  PT FREQUENCY: 1-2x/week  PT DURATION: 8 weeks  PLANNED INTERVENTIONS: 97110-Therapeutic exercises, 97530- Therapeutic activity, 97112- Neuromuscular re-education, 97535- Self Care, 16109- Manual therapy, [phone]- Gait training, [phone]- Aquatic Therapy, [phone]- Electrical stimulation (unattended), [phone]- Electrical stimulation (manual), [id number]- Ultrasound, [id number]- Traction  (mechanical), [id number]- Ionotophoresis 4mg /ml Dexamethasone , Patient/Family education, Balance training, Stair training, Taping, Dry Needling, Joint mobilization, Spinal mobilization, DME instructions, Cryotherapy, and Moist heat.  PLAN FOR NEXT SESSION: Nustep, review HEP,  begin core stabilization, DRY NEEDLING (patient would like to try as many as he can get in before we begin out of pocket charges)   [facility] B. Marquay Kruse, PT 06/18/23 5:18 PM [facility] [address], [address] [place], [place] [address] Phone # [phone] Fax [phone]

## 2023-06-20 ENCOUNTER — Encounter: Payer: Self-pay | Admitting: Physical Therapy

## 2023-06-20 ENCOUNTER — Ambulatory Visit: Payer: PRIVATE HEALTH INSURANCE | Admitting: Physical Therapy

## 2023-06-20 DIAGNOSIS — M6281 Muscle weakness (generalized): Secondary | ICD-10-CM

## 2023-06-20 DIAGNOSIS — R293 Abnormal posture: Secondary | ICD-10-CM

## 2023-06-20 DIAGNOSIS — M5459 Other low back pain: Secondary | ICD-10-CM | POA: Diagnosis not present

## 2023-06-20 DIAGNOSIS — R262 Difficulty in walking, not elsewhere classified: Secondary | ICD-10-CM

## 2023-06-20 DIAGNOSIS — R252 Cramp and spasm: Secondary | ICD-10-CM

## 2023-06-20 NOTE — Patient Instructions (Signed)

## 2023-06-20 NOTE — Therapy (Signed)
 OUTPATIENT PHYSICAL THERAPY THORACOLUMBAR TREATMENT   Patient Name: Russell Thomas MRN: [id number] DOB:09/30/1960, 63 y.o., male Today's Date: 06/20/2023  END OF SESSION:  PT End of Session - 06/20/23 1544     Visit Number 2    Date for PT Re-Evaluation 08/13/23    Authorization Type Medicare    Progress Note Due on Visit 10    PT Start Time 1445    PT Stop Time 1532    PT Time Calculation (min) 47 min    Activity Tolerance Patient tolerated treatment well    Behavior During Therapy [facility] for tasks assessed/performed              Past Medical History:  Diagnosis Date   Adenomatous colon polyp 2008   Dr Arvie Latus, GI   Allergic rhinitis    Anxiety    Diverticulosis    Gallbladder attack 2015   stones present   Gastritis    Hyperlipemia 2009   IBS (irritable bowel syndrome)    Internal hemorrhoids 2012   banded    Lichen planus    Pre-diabetes    Prostate cancer (HCC)    Seasonal allergies    Sleep apnea    [facility]   Past Surgical History:  Procedure Laterality Date   BIOPSY PROSTATE  12/03/2016   COLONOSCOPY  04/10/2010, 08/12/2006   3/12 with banding; 2008: adenomatous polyp, diverticulosis   INCISIONAL HERNIA REPAIR N/A 07/18/2020   Procedure: LAPAROSCOPIC INCISIONAL HERNIA REPAIR WITH MESH;  Surgeon: Oza Blumenthal, MD;  Location: [facility];  Service: General;  Laterality: N/A;   LYMPHADENECTOMY Bilateral 02/14/2017   Procedure: LYMPHADENECTOMY/PELVIC;  Surgeon: Florencio Hunting, MD;  Location: [facility];  Service: Urology;  Laterality: Bilateral;   ROBOT ASSISTED LAPAROSCOPIC RADICAL PROSTATECTOMY N/A 02/14/2017   Procedure: XI ROBOTIC ASSISTED LAPAROSCOPIC RADICAL PROSTATECTOMY LEVEL 3;  Surgeon: Florencio Hunting, MD;  Location: [facility];  Service: Urology;  Laterality: N/A;   SEPTOPLASTY     thumb surgery     TONSILLECTOMY     TRANSESOPHAGEAL ECHOCARDIOGRAM (CATH LAB) N/A 12/13/2022   Procedure: TRANSESOPHAGEAL ECHOCARDIOGRAM;  Surgeon: Olinda Bertrand, DO;  [facility]: [facility];  Service: Cardiovascular;  Laterality: N/A;   UPPER GASTROINTESTINAL ENDOSCOPY  10/24/2001   gastritis   UPPER GASTROINTESTINAL ENDOSCOPY  11/03/12   Patient Active Problem List   Diagnosis Date Noted   Obesity, Class III, BMI 40-49.9 (morbid obesity) 12/12/2022   Acute ischemic stroke (HCC) 12/11/2022   Primary hypertension 10/16/2022   Neck pain 10/16/2022   Insomnia 10/16/2022   S/P laparoscopic hernia repair 07/18/2020   History of prostate cancer 02/14/2017   Abnormal SPEP 09/30/2015   Morbid obesity with BMI of 40.0-44.9, adult (HCC) 01/20/2014   Cholelithiasis 06/01/2013   Hx of lichen planus 11/14/2012   GERD (gastroesophageal reflux disease) 10/21/2012   Hyperlipidemia, mixed 06/08/2009   History of colonic polyps 06/08/2009   Obstructive sleep apnea 07/15/2007   Diverticulosis of colon 06/07/2007   Anxiety state 05/07/2007    PCP: Barnetta Liberty, MD  REFERRING PROVIDER: Jeannette Mills, NP  REFERRING DIAG: [phone] (ICD-10-CM) - Spondylosis without myelopathy or radiculopathy, lumbar region  Rationale for Evaluation and Treatment: Rehabilitation  THERAPY DIAG:  Other low back pain  Muscle weakness (generalized)  Cramp and spasm  Abnormal posture  Difficulty in walking, not elsewhere classified  ONSET DATE: 06/18/2023  SUBJECTIVE:  SUBJECTIVE STATEMENT: Patient reports that he felt good after evaluation but yesterday his pain was increased. His pain has been intermittent and it ranges from 0-9/10. He has been doing his HEP.   From Eval: Patient reports recent hx of a tremendous amount of stress.  Wife has stage 4 cancer and  a job situation, traveling and having a recent stroke.  Cardiologist feels the stroke was primarily due  to elevated blood pressure due to the stress.  He feels most of his pain in left low back and gluteals but has experienced some symptoms recently in the right as well. 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  He does have optional standing desk.  Pain is worse with prolonged standing.    PERTINENT HISTORY:  Recent CVA but minimal long term effects or weakness on affected side.    PAIN:  Are you having pain? Yes: NPRS scale: 8/10 Pain location: low back and gluteals Left > Right Pain description: aching Aggravating factors: prolonged standing Relieving factors: lying down, heat  PRECAUTIONS: None  RED FLAGS: None   WEIGHT BEARING RESTRICTIONS: No  FALLS:  Has patient fallen in last 6 months? No  LIVING ENVIRONMENT: Lives with: lives with their spouse Lives in: House/apartment   OCCUPATION: desk job  PLOF: Independent, Independent with basic ADLs, Independent with household mobility without device, Independent with community mobility without device, Independent with homemaking with ambulation, Independent with gait, and Independent with transfers  PATIENT GOALS: to be able to exercise, lose weight, feel healthy and be able to do routine daily activities without pain.  NEXT MD VISIT: prn  OBJECTIVE:  Note: Objective measures were completed at Evaluation unless otherwise noted.  DIAGNOSTIC FINDINGS:  None on lumbar spine  PATIENT SURVEYS:  Modified Oswestry 11/50= 22%   COGNITION: Overall cognitive status: Within functional limits for tasks assessed     SENSATION: WFL  MUSCLE LENGTH: Hamstrings: Right 45 deg; Left 45 deg Thomas test: Right pos; Left pos  POSTURE: decreased lumbar lordosis   LUMBAR ROM:   AROM eval  Flexion Fingertips to upper shin  Extension 50% with sharp pain  Right lateral flexion Fingertips to joint line  Left lateral flexion Fingertips to joint line  Right rotation WFL  Left rotation  WFL   (Blank rows = not tested)  LOWER EXTREMITY ROM:     WFL  LOWER EXTREMITY MMT:    Generally 4 to 4+/5  LUMBAR SPECIAL TESTS:  Straight leg raise test: Negative  FUNCTIONAL TESTS:  5 times sit to stand: 16.40 sec pushing up on knees Timed up and go (TUG): 8.01 sec  GAIT: Distance walked: 30 feet Assistive device utilized: None Level of assistance: Complete Independence Comments: antalgic , slow, guarded  TREATMENT DATE:  06/20/2023 NuStep Level 5 - 5 mins- PT present to discuss status Standing hamstring stretch at stair 3 x 30 sec bilateral  Quad stretch on chair 3 x 20 sec bilateral  Seated figure four 2 x 30 sec  Hooklying TA activation x 20 (added to HEP) Hooklying alternate hand and knee press x 10 bilateral (added to HEP) Trigger Point Dry Needling  Initial Treatment: Pt instructed on Dry Needling rational, procedures, and possible side effects. Pt instructed to expect mild to moderate muscle soreness later in the day and/or into the next day.  Pt instructed in methods to reduce muscle soreness. Pt instructed to continue prescribed HEP.  Patient Verbal Consent  Given: Yes Education Handout Provided: Yes Muscles Treated: left lumbar multifidi; left glutes Electrical Stimulation Performed: No Treatment Response/Outcome: Utilized skilled palpation to identify trigger points.  During dry needling able to palpate muscle twitch and muscle elongation     06/18/23 Initial eval completed and initiated HEP    Educated on anatomy of the spine and behavior of the intervertebral discs with certain postures and body mechanics                                                                                                                              PATIENT EDUCATION:  Education details: See above Person educated: Patient Education method: Programmer, multimedia, Facilities manager, Verbal cues, and Handouts Education comprehension: verbalized understanding, returned demonstration,  and verbal cues required  HOME EXERCISE PROGRAM: Access Code: [email or web address] URL: [email or web address] Date: 06/20/2023 Prepared by: Penelope Bowie  Exercises - Standing Hamstring Stretch on Chair  - 1 x daily - 7 x weekly - 1 sets - 3 reps - 30 sec hold - Theatre manager with Chair and Counter Support  - 1 x daily - 7 x weekly - 1 sets - 3 reps - 30 sec hold - Seated Figure 4 Piriformis Stretch  - 1 x daily - 7 x weekly - 1 sets - 3 reps - 30 sed hold - Supine Transversus Abdominis Bracing - Hands on Stomach  - 1 x daily - 7 x weekly - 2 sets - 10 reps - 3 hold - Supine Alternating Knee Taps with Hands  - 1 x daily - 7 x weekly - 1 sets - 10 reps - 3 hold  ASSESSMENT:  CLINICAL IMPRESSION: Illona Malone presents to therapy to first follow up appointment since evaluation. He verbalized compliance with HEP and he required minimal verbal cues for form correction. Incorporated core strengthening exercises today and patient required tactile and verbal cues for correct performance and breathing technique. Dry needling was successful at elongating gluteal muscles. Educated patient on HEP stretches to perform after dry needling. Patient should progress well with physical therapy.   OBJECTIVE IMPAIRMENTS: decreased knowledge of condition, difficulty walking, decreased ROM, decreased strength, increased fascial restrictions, increased muscle spasms, impaired flexibility, postural dysfunction, obesity, and pain.   ACTIVITY LIMITATIONS: carrying, lifting, bending, sitting, standing, squatting, sleeping, transfers, bed mobility, bathing, toileting, dressing, hygiene/grooming, and caring for others  PARTICIPATION LIMITATIONS: meal prep, cleaning, laundry, driving, shopping, community activity, occupation, and yard work  PERSONAL FACTORS: Fitness, Past/current experiences, Profession, and 3+ comorbidities: htn, recent CVA, obesity are also affecting patient's functional outcome.   REHAB POTENTIAL:  Good  CLINICAL DECISION MAKING: Stable/uncomplicated  EVALUATION COMPLEXITY: Low   GOALS: Goals reviewed with patient? Yes  SHORT TERM GOALS: Target date: 07/16/2023  Pain report to be no greater than 4/10  Baseline: Goal status: INITIAL  2.  Patient will be independent with initial HEP  Baseline:  Goal status: INITIAL   LONG TERM GOALS: Target date: 08/13/2023  Patient to report pain  no greater than 2/10  Baseline:  Goal status: INITIAL  2.  Patient to be independent with advanced HEP  Baseline:  Goal status: INITIAL  3.  ODI to improve to 14% or less Baseline:  Goal status: INITIAL  4.  Functional tests to improve by 2-3 seconds Baseline:  Goal status: INITIAL  5.  Patient to be able to bend, stoop and squat with pain no greater than 2/10  Baseline:  Goal status: INITIAL  6.  Patient to be able to stand or walk for at least 15 min without leg pain  Baseline:  Goal status: INITIAL  PLAN:  PT FREQUENCY: 1-2x/week  PT DURATION: 8 weeks  PLANNED INTERVENTIONS: 97110-Therapeutic exercises, 97530- Therapeutic activity, 97112- Neuromuscular re-education, 97535- Self Care, 96295- Manual therapy, [phone]- Gait training, [phone]- Aquatic Therapy, [phone]- Electrical stimulation (unattended), [phone]- Electrical stimulation (manual), [id number]- Ultrasound, [id number]- Traction (mechanical), [id number]- Ionotophoresis 4mg /ml Dexamethasone , Patient/Family education, Balance training, Stair training, Taping, Dry Needling, Joint mobilization, Spinal mobilization, DME instructions, Cryotherapy, and Moist heat.  PLAN FOR NEXT SESSION: assess DN #1; possible DN to QL and gluteals again; continue core strengthening and hip flexibility;   Penelope Bowie, PT 06/20/23 3:45 PM [facility] [address], [address] [place], [place] [address] Phone # [phone] Fax [phone]

## 2023-07-02 ENCOUNTER — Ambulatory Visit: Payer: Managed Care, Other (non HMO) | Admitting: Adult Health

## 2023-07-03 ENCOUNTER — Ambulatory Visit: Admitting: Adult Health

## 2023-07-05 ENCOUNTER — Ambulatory Visit: Payer: PRIVATE HEALTH INSURANCE | Admitting: Rehabilitative and Restorative Service Providers"

## 2023-07-05 ENCOUNTER — Encounter: Payer: Self-pay | Admitting: Rehabilitative and Restorative Service Providers"

## 2023-07-05 DIAGNOSIS — R252 Cramp and spasm: Secondary | ICD-10-CM

## 2023-07-05 DIAGNOSIS — M6281 Muscle weakness (generalized): Secondary | ICD-10-CM

## 2023-07-05 DIAGNOSIS — R293 Abnormal posture: Secondary | ICD-10-CM

## 2023-07-05 DIAGNOSIS — M5459 Other low back pain: Secondary | ICD-10-CM | POA: Diagnosis not present

## 2023-07-05 DIAGNOSIS — R262 Difficulty in walking, not elsewhere classified: Secondary | ICD-10-CM

## 2023-07-05 NOTE — Therapy (Signed)
 OUTPATIENT PHYSICAL THERAPY THORACOLUMBAR TREATMENT   Patient Name: Russell Thomas ON MRN: [id number] DOB:1960-08-26, 63 y.o., male Today's Date: 07/05/2023  END OF SESSION:  PT End of Session - 07/05/23 1104     Visit Number 3    Date for PT Re-Evaluation 08/13/23    Authorization Type Medicare    Progress Note Due on Visit 10    PT Start Time 1100    PT Stop Time 1141    PT Time Calculation (min) 41 min    Activity Tolerance Patient tolerated treatment well    Behavior During Therapy [facility] for tasks assessed/performed              Past Medical History:  Diagnosis Date   Adenomatous colon polyp 2008   Dr Arvie Latus, GI   Allergic rhinitis    Anxiety    Diverticulosis    Gallbladder attack 2015   stones present   Gastritis    Hyperlipemia 2009   IBS (irritable bowel syndrome)    Internal hemorrhoids 2012   banded    Lichen planus    Pre-diabetes    Prostate cancer (HCC)    Seasonal allergies    Sleep apnea    [facility]   Past Surgical History:  Procedure Laterality Date   BIOPSY PROSTATE  12/03/2016   COLONOSCOPY  04/10/2010, 08/12/2006   3/12 with banding; 2008: adenomatous polyp, diverticulosis   INCISIONAL HERNIA REPAIR N/A 07/18/2020   Procedure: LAPAROSCOPIC INCISIONAL HERNIA REPAIR WITH MESH;  Surgeon: Oza Blumenthal, MD;  Location: [facility];  Service: General;  Laterality: N/A;   LYMPHADENECTOMY Bilateral 02/14/2017   Procedure: LYMPHADENECTOMY/PELVIC;  Surgeon: Florencio Hunting, MD;  Location: [facility];  Service: Urology;  Laterality: Bilateral;   ROBOT ASSISTED LAPAROSCOPIC RADICAL PROSTATECTOMY N/A 02/14/2017   Procedure: XI ROBOTIC ASSISTED LAPAROSCOPIC RADICAL PROSTATECTOMY LEVEL 3;  Surgeon: Florencio Hunting, MD;  Location: [facility];  Service: Urology;  Laterality: N/A;   SEPTOPLASTY     thumb surgery     TONSILLECTOMY     TRANSESOPHAGEAL ECHOCARDIOGRAM (CATH LAB) N/A 12/13/2022   Procedure: TRANSESOPHAGEAL ECHOCARDIOGRAM;  Surgeon: Olinda Bertrand, DO;  [facility]: [facility];  Service: Cardiovascular;  Laterality: N/A;   UPPER GASTROINTESTINAL ENDOSCOPY  10/24/2001   gastritis   UPPER GASTROINTESTINAL ENDOSCOPY  11/03/12   Patient Active Problem List   Diagnosis Date Noted   Obesity, Class III, BMI 40-49.9 (morbid obesity) 12/12/2022   Acute ischemic stroke (HCC) 12/11/2022   Primary hypertension 10/16/2022   Neck pain 10/16/2022   Insomnia 10/16/2022   S/P laparoscopic hernia repair 07/18/2020   History of prostate cancer 02/14/2017   Abnormal SPEP 09/30/2015   Morbid obesity with BMI of 40.0-44.9, adult (HCC) 01/20/2014   Cholelithiasis 06/01/2013   Hx of lichen planus 11/14/2012   GERD (gastroesophageal reflux disease) 10/21/2012   Hyperlipidemia, mixed 06/08/2009   History of colonic polyps 06/08/2009   Obstructive sleep apnea 07/15/2007   Diverticulosis of colon 06/07/2007   Anxiety state 05/07/2007    PCP: Barnetta Liberty, MD  REFERRING PROVIDER: Jeannette Mills, NP  REFERRING DIAG: [phone] (ICD-10-CM) - Spondylosis without myelopathy or radiculopathy, lumbar region  Rationale for Evaluation and Treatment: Rehabilitation  THERAPY DIAG:  Other low back pain  Muscle weakness (generalized)  Cramp and spasm  Abnormal posture  Difficulty in walking, not elsewhere classified  ONSET DATE: 06/18/2023  SUBJECTIVE:  SUBJECTIVE STATEMENT: Patient reports that he had an injection from the MD and he is feeling a lot better.   From Eval: Patient reports recent hx of a tremendous amount of stress.  Wife has stage 4 cancer and  a job situation, traveling and having a recent stroke.  Cardiologist feels the stroke was primarily due to elevated blood pressure due to the stress.  He feels most of his pain in left low  back and gluteals but has experienced some symptoms recently in the right as well. 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  He does have optional standing desk.  Pain is worse with prolonged standing.    PERTINENT HISTORY:  Recent CVA but minimal long term effects or weakness on affected side.    PAIN:  Are you having pain? Yes: NPRS scale: 2/10 Pain location: low back and gluteals Left > Right Pain description: aching Aggravating factors: prolonged standing Relieving factors: lying down, heat  PRECAUTIONS: None  RED FLAGS: None   WEIGHT BEARING RESTRICTIONS: No  FALLS:  Has patient fallen in last 6 months? No  LIVING ENVIRONMENT: Lives with: lives with their spouse Lives in: House/apartment   OCCUPATION: desk job  PLOF: Independent, Independent with basic ADLs, Independent with household mobility without device, Independent with community mobility without device, Independent with homemaking with ambulation, Independent with gait, and Independent with transfers  PATIENT GOALS: to be able to exercise, lose weight, feel healthy and be able to do routine daily activities without pain.  NEXT MD VISIT: prn  OBJECTIVE:  Note: Objective measures were completed at Evaluation unless otherwise noted.  DIAGNOSTIC FINDINGS:  None on lumbar spine  PATIENT SURVEYS:  Eval:  Modified Oswestry 11/50= 22%   COGNITION: Overall cognitive status: Within functional limits for tasks assessed     SENSATION: WFL  MUSCLE LENGTH: Hamstrings: Right 45 deg; Left 45 deg Thomas test: Right pos; Left pos  POSTURE: decreased lumbar lordosis   LUMBAR ROM:   AROM eval  Flexion Fingertips to upper shin  Extension 50% with sharp pain  Right lateral flexion Fingertips to joint line  Left lateral flexion Fingertips to joint line  Right rotation WFL  Left rotation WFL   (Blank rows = not tested)  LOWER EXTREMITY ROM:     WFL  LOWER  EXTREMITY MMT:    Generally 4 to 4+/5  LUMBAR SPECIAL TESTS:  Straight leg raise test: Negative  FUNCTIONAL TESTS:  Eval: 5 times sit to stand: 16.40 sec pushing up on knees Timed up and go (TUG): 8.01 sec  GAIT: Distance walked: 30 feet Assistive device utilized: None Level of assistance: Complete Independence Comments: antalgic , slow, guarded  TREATMENT DATE:  07/05/2023: Nustep level 4 x6 min with PT present to discuss status Standing hamstring stretch on stairs 2x20 sec bilat Standing hip flexor stretch with foot on 2nd step with overhead lateral reach x10 bilat Seated piriformis stretch 2x20 sec Supine transversus abdominus (TA) contraction by pressing hands into thighs 2x10 (patient required cuing for proper breathing and not holding his breath during contraction) Supine TA contraction with marching 3x5 Supine bent knee fallout 5x5 sec hold Prone on elbows x2 min Trigger Point Dry Needling Subsequent Treatment: Instructions provided previously at initial dry needling treatment.  Patient Verbal Consent Given: Yes Education Handout Provided: Previously Provided Muscles Treated: bilateral lumbar multifidi, bilateral glutes/piriformis Electrical Stimulation Performed: No Treatment Response/Outcome: Utilized skilled palpation to identify bony  landmarks and trigger points.  Able to illicit twitch response and muscle elongation.  Soft tissue mobilization following to further promote tissue elongation.     06/20/2023 NuStep Level 5 - 5 mins- PT present to discuss status Standing hamstring stretch at stair 3 x 30 sec bilateral  Quad stretch on chair 3 x 20 sec bilateral  Seated figure four 2 x 30 sec  Hooklying TA activation x 20 (added to HEP) Hooklying alternate hand and knee press x 10 bilateral (added to HEP) Trigger Point Dry Needling  Initial Treatment: Pt instructed on Dry Needling rational, procedures, and possible side effects. Pt instructed to expect mild to  moderate muscle soreness later in the day and/or into the next day.  Pt instructed in methods to reduce muscle soreness. Pt instructed to continue prescribed HEP.  Patient Verbal Consent Given: Yes Education Handout Provided: Yes Muscles Treated: left lumbar multifidi; left glutes Electrical Stimulation Performed: No Treatment Response/Outcome: Utilized skilled palpation to identify trigger points.  During dry needling able to palpate muscle twitch and muscle elongation     06/18/23 Initial eval completed and initiated HEP    Educated on anatomy of the spine and behavior of the intervertebral discs with certain postures and body mechanics                                                                                                                              PATIENT EDUCATION:  Education details: See above Person educated: Patient Education method: Programmer, multimedia, Facilities manager, Verbal cues, and Handouts Education comprehension: verbalized understanding, returned demonstration, and verbal cues required  HOME EXERCISE PROGRAM: Access Code: [email or web address] URL: [email or web address] Date: 07/05/2023 Prepared by: Chaneta Comer Sadiyah Kangas  Exercises - Standing Hamstring Stretch on Chair  - 1 x daily - 7 x weekly - 1 sets - 3 reps - 30 sec hold - Theatre manager with Chair and Counter Support  - 1 x daily - 7 x weekly - 1 sets - 3 reps - 30 sec hold - Seated Figure 4 Piriformis Stretch  - 1 x daily - 7 x weekly - 1 sets - 3 reps - 30 sed hold - Supine Transversus Abdominis Bracing - Hands on Stomach  - 1 x daily - 7 x weekly - 2 sets - 10 reps - 3 hold - Supine Alternating Knee Taps with Hands  - 1 x daily - 7 x weekly - 1 sets - 10 reps - 3 hold - Prone on Elbows Stretch  - 1-2 x daily - 7 x weekly - 2-5 min hold  ASSESSMENT:  CLINICAL IMPRESSION: Mr Ida presents to skilled PT reporting that his pain has improved since having an injection by his MD.  Patient states that the  frequency of the sharp pain has decreased.  Patient only requires min cuing throughout for improved technique and body mechanics during session.  Patient states that hip flexor stretch felt good, especially as he works from home and is  sitting a lot.  Patient with slight twitch response to lumbar multifidi, but a great twitch response to left piriformis muscle.  Patient reported feeling decreased pain after dry needling and subsequent soft tissue mobilization.  Patient continues to progress towards goal related activities and has now met all short term goals.   OBJECTIVE IMPAIRMENTS: decreased knowledge of condition, difficulty walking, decreased ROM, decreased strength, increased fascial restrictions, increased muscle spasms, impaired flexibility, postural dysfunction, obesity, and pain.   ACTIVITY LIMITATIONS: carrying, lifting, bending, sitting, standing, squatting, sleeping, transfers, bed mobility, bathing, toileting, dressing, hygiene/grooming, and caring for others  PARTICIPATION LIMITATIONS: meal prep, cleaning, laundry, driving, shopping, community activity, occupation, and yard work  PERSONAL FACTORS: Fitness, Past/current experiences, Profession, and 3+ comorbidities: htn, recent CVA, obesity are also affecting patient's functional outcome.   REHAB POTENTIAL: Good  CLINICAL DECISION MAKING: Stable/uncomplicated  EVALUATION COMPLEXITY: Low   GOALS: Goals reviewed with patient? Yes  SHORT TERM GOALS: Target date: 07/16/2023  Pain report to be no greater than 4/10  Baseline: Goal status: Met on 07/05/23  2.  Patient will be independent with initial HEP  Baseline:  Goal status: Met on 07/05/2023   LONG TERM GOALS: Target date: 08/13/2023  Patient to report pain no greater than 2/10  Baseline:  Goal status: Ongoing  2.  Patient to be independent with advanced HEP  Baseline:  Goal status: INITIAL  3.  ODI to improve to 14% or less Baseline:  Goal status: INITIAL  4.   Functional tests to improve by 2-3 seconds Baseline:  Goal status: INITIAL  5.  Patient to be able to bend, stoop and squat with pain no greater than 2/10  Baseline:  Goal status: INITIAL  6.  Patient to be able to stand or walk for at least 15 min without leg pain  Baseline:  Goal status: INITIAL  PLAN:  PT FREQUENCY: 1-2x/week  PT DURATION: 8 weeks  PLANNED INTERVENTIONS: 97110-Therapeutic exercises, 97530- Therapeutic activity, 97112- Neuromuscular re-education, 97535- Self Care, 16109- Manual therapy, [phone]- Gait training, [phone]- Aquatic Therapy, [phone]- Electrical stimulation (unattended), [phone]- Electrical stimulation (manual), [id number]- Ultrasound, [id number]- Traction (mechanical), [id number]- Ionotophoresis 4mg /ml Dexamethasone , Patient/Family education, Balance training, Stair training, Taping, Dry Needling, Joint mobilization, Spinal mobilization, DME instructions, Cryotherapy, and Moist heat.  PLAN FOR NEXT SESSION: Assess and progress HEP as indicated, strengthening, flexibility, manual/dry needling as indicated    Robyne Christen, PT, DPT 07/05/23, 11:53 AM  [facility] [address], [address] [place], [place] [address] Phone # [phone] Fax [phone]

## 2023-07-09 ENCOUNTER — Encounter: Payer: Self-pay | Admitting: Rehabilitative and Restorative Service Providers"

## 2023-07-09 ENCOUNTER — Ambulatory Visit: Attending: Student | Admitting: Rehabilitative and Restorative Service Providers"

## 2023-07-09 DIAGNOSIS — R293 Abnormal posture: Secondary | ICD-10-CM | POA: Insufficient documentation

## 2023-07-09 DIAGNOSIS — R262 Difficulty in walking, not elsewhere classified: Secondary | ICD-10-CM | POA: Insufficient documentation

## 2023-07-09 DIAGNOSIS — R252 Cramp and spasm: Secondary | ICD-10-CM | POA: Diagnosis present

## 2023-07-09 DIAGNOSIS — M6281 Muscle weakness (generalized): Secondary | ICD-10-CM | POA: Diagnosis present

## 2023-07-09 DIAGNOSIS — M5459 Other low back pain: Secondary | ICD-10-CM | POA: Insufficient documentation

## 2023-07-09 NOTE — Therapy (Signed)
 OUTPATIENT PHYSICAL THERAPY THORACOLUMBAR TREATMENT   Patient Name: Russell Thomas MRN: [id number] DOB:1960-09-19, 63 y.o., male Today's Date: 07/09/2023  END OF SESSION:  PT End of Session - 07/09/23 1446     Visit Number 4    Date for PT Re-Evaluation 08/13/23    Authorization Type Medicare    Progress Note Due on Visit 10    PT Start Time 1445    PT Stop Time 1525    PT Time Calculation (min) 40 min    Activity Tolerance Patient tolerated treatment well    Behavior During Therapy [facility] for tasks assessed/performed              Past Medical History:  Diagnosis Date   Adenomatous colon polyp 2008   Dr Arvie Latus, GI   Allergic rhinitis    Anxiety    Diverticulosis    Gallbladder attack 2015   stones present   Gastritis    Hyperlipemia 2009   IBS (irritable bowel syndrome)    Internal hemorrhoids 2012   banded    Lichen planus    Pre-diabetes    Prostate cancer (HCC)    Seasonal allergies    Sleep apnea    [facility]   Past Surgical History:  Procedure Laterality Date   BIOPSY PROSTATE  12/03/2016   COLONOSCOPY  04/10/2010, 08/12/2006   3/12 with banding; 2008: adenomatous polyp, diverticulosis   INCISIONAL HERNIA REPAIR N/A 07/18/2020   Procedure: LAPAROSCOPIC INCISIONAL HERNIA REPAIR WITH MESH;  Surgeon: Oza Blumenthal, MD;  Location: [facility];  Service: General;  Laterality: N/A;   LYMPHADENECTOMY Bilateral 02/14/2017   Procedure: LYMPHADENECTOMY/PELVIC;  Surgeon: Florencio Hunting, MD;  Location: [facility];  Service: Urology;  Laterality: Bilateral;   ROBOT ASSISTED LAPAROSCOPIC RADICAL PROSTATECTOMY N/A 02/14/2017   Procedure: XI ROBOTIC ASSISTED LAPAROSCOPIC RADICAL PROSTATECTOMY LEVEL 3;  Surgeon: Florencio Hunting, MD;  Location: [facility];  Service: Urology;  Laterality: N/A;   SEPTOPLASTY     thumb surgery     TONSILLECTOMY     TRANSESOPHAGEAL ECHOCARDIOGRAM (CATH LAB) N/A 12/13/2022   Procedure: TRANSESOPHAGEAL ECHOCARDIOGRAM;  Surgeon: Olinda Bertrand, DO;  Location: [facility];  Service: Cardiovascular;  Laterality: N/A;   UPPER GASTROINTESTINAL ENDOSCOPY  10/24/2001   gastritis   UPPER GASTROINTESTINAL ENDOSCOPY  11/03/12   Patient Active Problem List   Diagnosis Date Noted   Obesity, Class III, BMI 40-49.9 (morbid obesity) 12/12/2022   Acute ischemic stroke (HCC) 12/11/2022   Primary hypertension 10/16/2022   Neck pain 10/16/2022   Insomnia 10/16/2022   S/P laparoscopic hernia repair 07/18/2020   History of prostate cancer 02/14/2017   Abnormal SPEP 09/30/2015   Morbid obesity with BMI of 40.0-44.9, adult (HCC) 01/20/2014   Cholelithiasis 06/01/2013   Hx of lichen planus 11/14/2012   GERD (gastroesophageal reflux disease) 10/21/2012   Hyperlipidemia, mixed 06/08/2009   History of colonic polyps 06/08/2009   Obstructive sleep apnea 07/15/2007   Diverticulosis of colon 06/07/2007   Anxiety state 05/07/2007    PCP: Barnetta Liberty, MD  REFERRING PROVIDER: Jeannette Mills, NP  REFERRING DIAG: [phone] (ICD-10-CM) - Spondylosis without myelopathy or radiculopathy, lumbar region  Rationale for Evaluation and Treatment: Rehabilitation  THERAPY DIAG:  Other low back pain  Muscle weakness (generalized)  Cramp and spasm  Abnormal posture  Difficulty in walking, not elsewhere classified  ONSET DATE: 06/18/2023  SUBJECTIVE:  SUBJECTIVE STATEMENT: Patient reports that his pain is currently 0/10, but states that the worst the pain has been is 3/10 of a dull ache.   From Eval: Patient reports recent hx of a tremendous amount of stress.  Wife has stage 4 cancer and  a job situation, traveling and having a recent stroke.  Cardiologist feels the stroke was primarily due to elevated blood pressure due to the stress.  He feels  most of his pain in left low back and gluteals but has experienced some symptoms recently in the right as well. 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  He does have optional standing desk.  Pain is worse with prolonged standing.    PERTINENT HISTORY:  Recent CVA but minimal long term effects or weakness on affected side.    PAIN:  Are you having pain? Yes: NPRS scale: 0-3/10 Pain location: low back and gluteals Left > Right Pain description: aching Aggravating factors: prolonged standing Relieving factors: lying down, heat  PRECAUTIONS: None  RED FLAGS: None   WEIGHT BEARING RESTRICTIONS: No  FALLS:  Has patient fallen in last 6 months? No  LIVING ENVIRONMENT: Lives with: lives with their spouse Lives in: House/apartment   OCCUPATION: desk job  PLOF: Independent, Independent with basic ADLs, Independent with household mobility without device, Independent with community mobility without device, Independent with homemaking with ambulation, Independent with gait, and Independent with transfers  PATIENT GOALS: to be able to exercise, lose weight, feel healthy and be able to do routine daily activities without pain.  NEXT MD VISIT: prn  OBJECTIVE:  Note: Objective measures were completed at Evaluation unless otherwise noted.  DIAGNOSTIC FINDINGS:  None on lumbar spine  PATIENT SURVEYS:  Eval:  Modified Oswestry 11/50= 22%   COGNITION: Overall cognitive status: Within functional limits for tasks assessed     SENSATION: WFL  MUSCLE LENGTH: Hamstrings: Right 45 deg; Left 45 deg Thomas test: Right pos; Left pos  POSTURE: decreased lumbar lordosis   LUMBAR ROM:   AROM eval  Flexion Fingertips to upper shin  Extension 50% with sharp pain  Right lateral flexion Fingertips to joint line  Left lateral flexion Fingertips to joint line  Right rotation WFL  Left rotation WFL   (Blank rows = not tested)  LOWER  EXTREMITY ROM:     WFL  LOWER EXTREMITY MMT:    Generally 4 to 4+/5  LUMBAR SPECIAL TESTS:  Straight leg raise test: Negative  FUNCTIONAL TESTS:  Eval: 5 times sit to stand: 16.40 sec pushing up on knees Timed up and go (TUG): 8.01 sec  GAIT: Distance walked: 30 feet Assistive device utilized: None Level of assistance: Complete Independence Comments: antalgic , slow, guarded  TREATMENT DATE:  07/09/2023: Nustep level 5 x6 min with PT present to discuss status Standing hip flexor stretch with foot on 2nd step with overhead lateral reach x10 bilat Standing hamstring stretch on stairs 2x20 sec bilat Seated piriformis stretch 2x20 sec Standing marching holding 10# kettlebell in Farmer's carry performing high marching x10 bilat Seated modified Russian twitch holding 10# kettlebell x10 Supine transversus abdominus (TA) contraction by pressing hands into thighs 2x10 (patient required cuing for proper breathing and not holding his breath during contraction) Supine TA contraction with marching 2x10 Seated TA contraction with LE extension 2x10 Supine bent knee fallout x10 Supine bridge 2x10 (cuing for glute activation) Prone on elbows x2 min   07/05/2023: Nustep level  4 x6 min with PT present to discuss status Standing hamstring stretch on stairs 2x20 sec bilat Standing hip flexor stretch with foot on 2nd step with overhead lateral reach x10 bilat Seated piriformis stretch 2x20 sec Supine transversus abdominus (TA) contraction by pressing hands into thighs 2x10 (patient required cuing for proper breathing and not holding his breath during contraction) Supine TA contraction with marching 3x5 Supine bent knee fallout 5x5 sec hold Prone on elbows x2 min Trigger Point Dry Needling Subsequent Treatment: Instructions provided previously at initial dry needling treatment.  Patient Verbal Consent Given: Yes Education Handout Provided: Previously Provided Muscles Treated: bilateral  lumbar multifidi, bilateral glutes/piriformis Electrical Stimulation Performed: No Treatment Response/Outcome: Utilized skilled palpation to identify bony landmarks and trigger points.  Able to illicit twitch response and muscle elongation.  Soft tissue mobilization following to further promote tissue elongation.     06/20/2023 NuStep Level 5 - 5 mins- PT present to discuss status Standing hamstring stretch at stair 3 x 30 sec bilateral  Quad stretch on chair 3 x 20 sec bilateral  Seated figure four 2 x 30 sec  Hooklying TA activation x 20 (added to HEP) Hooklying alternate hand and knee press x 10 bilateral (added to HEP) Trigger Point Dry Needling  Initial Treatment: Pt instructed on Dry Needling rational, procedures, and possible side effects. Pt instructed to expect mild to moderate muscle soreness later in the day and/or into the next day.  Pt instructed in methods to reduce muscle soreness. Pt instructed to continue prescribed HEP.  Patient Verbal Consent Given: Yes Education Handout Provided: Yes Muscles Treated: left lumbar multifidi; left glutes Electrical Stimulation Performed: No Treatment Response/Outcome: Utilized skilled palpation to identify trigger points.  During dry needling able to palpate muscle twitch and muscle elongation    PATIENT EDUCATION:  Education details: See above Person educated: Patient Education method: Programmer, multimedia, Demonstration, Verbal cues, and Handouts Education comprehension: verbalized understanding, returned demonstration, and verbal cues required  HOME EXERCISE PROGRAM: Access Code: [email or web address] URL: [email or web address] Date: 07/05/2023 Prepared by: Chaneta Comer Gael Delude  Exercises - Standing Hamstring Stretch on Chair  - 1 x daily - 7 x weekly - 1 sets - 3 reps - 30 sec hold - Theatre manager with Chair and Counter Support  - 1 x daily - 7 x weekly - 1 sets - 3 reps - 30 sec hold - Seated Figure 4 Piriformis Stretch  - 1 x  daily - 7 x weekly - 1 sets - 3 reps - 30 sed hold - Supine Transversus Abdominis Bracing - Hands on Stomach  - 1 x daily - 7 x weekly - 2 sets - 10 reps - 3 hold - Supine Alternating Knee Taps with Hands  - 1 x daily - 7 x weekly - 1 sets - 10 reps - 3 hold - Prone on Elbows Stretch  - 1-2 x daily - 7 x weekly - 2-5 min hold  ASSESSMENT:  CLINICAL IMPRESSION: Mr Erbes presents to skilled PT reporting continued progress towards decreased overall pain.  Patient states that the prone on elbows at home seems to be assisting with his decreased pain.  Patient able to progress with core stability exercises.  Added in Farmer's carry high march today and patient able to perform, but had increased challenge to his balance and requires CGA during exercise due to single leg stance time.  Patient states that he believes that he has another injection scheduled with his MD next week, so he requests to be on  the waiting list next week to pick up appointments as they become available and if that works with his schedule.  Patient continues to require skilled PT to progress towards goal related activities.   OBJECTIVE IMPAIRMENTS: decreased knowledge of condition, difficulty walking, decreased ROM, decreased strength, increased fascial restrictions, increased muscle spasms, impaired flexibility, postural dysfunction, obesity, and pain.   ACTIVITY LIMITATIONS: carrying, lifting, bending, sitting, standing, squatting, sleeping, transfers, bed mobility, bathing, toileting, dressing, hygiene/grooming, and caring for others  PARTICIPATION LIMITATIONS: meal prep, cleaning, laundry, driving, shopping, community activity, occupation, and yard work  PERSONAL FACTORS: Fitness, Past/current experiences, Profession, and 3+ comorbidities: htn, recent CVA, obesity are also affecting patient's functional outcome.   REHAB POTENTIAL: Good  CLINICAL DECISION MAKING: Stable/uncomplicated  EVALUATION COMPLEXITY:  Low   GOALS: Goals reviewed with patient? Yes  SHORT TERM GOALS: Target date: 07/16/2023  Pain report to be no greater than 4/10  Baseline: Goal status: Met on 07/05/23  2.  Patient will be independent with initial HEP  Baseline:  Goal status: Met on 07/05/2023   LONG TERM GOALS: Target date: 08/13/2023  Patient to report pain no greater than 2/10  Baseline:  Goal status: Ongoing  2.  Patient to be independent with advanced HEP  Baseline:  Goal status: Ongoing  3.  ODI to improve to 14% or less Baseline:  Goal status: INITIAL  4.  Functional tests to improve by 2-3 seconds Baseline:  Goal status: INITIAL  5.  Patient to be able to bend, stoop and squat with pain no greater than 2/10  Baseline:  Goal status: INITIAL  6.  Patient to be able to stand or walk for at least 15 min without leg pain  Baseline:  Goal status: INITIAL  PLAN:  PT FREQUENCY: 1-2x/week  PT DURATION: 8 weeks  PLANNED INTERVENTIONS: 97110-Therapeutic exercises, 97530- Therapeutic activity, 97112- Neuromuscular re-education, 97535- Self Care, [id number]- Manual therapy, [phone]- Gait training, [phone]- Aquatic Therapy, [phone]- Electrical stimulation (unattended), [phone]- Electrical stimulation (manual), [id number]- Ultrasound, [id number]- Traction (mechanical), [id number]- Ionotophoresis 4mg /ml Dexamethasone , Patient/Family education, Balance training, Stair training, Taping, Dry Needling, Joint mobilization, Spinal mobilization, DME instructions, Cryotherapy, and Moist heat.  PLAN FOR NEXT SESSION: Assess and progress HEP as indicated, strengthening, flexibility, manual/dry needling as indicated, Oswestry and 5 times sit to stand.   Robyne Christen, PT, DPT 07/09/23, 3:51 PM  [facility] [address] [place], [place] [address] Phone # [phone] Fax [phone]

## 2023-08-07 ENCOUNTER — Encounter: Payer: Self-pay | Admitting: Neurology

## 2023-08-07 ENCOUNTER — Ambulatory Visit (INDEPENDENT_AMBULATORY_CARE_PROVIDER_SITE_OTHER): Admitting: Neurology

## 2023-08-07 VITALS — BP 113/81 | HR 93 | Resp 16 | Ht 75.0 in

## 2023-08-07 DIAGNOSIS — E782 Mixed hyperlipidemia: Secondary | ICD-10-CM | POA: Diagnosis not present

## 2023-08-07 DIAGNOSIS — G4733 Obstructive sleep apnea (adult) (pediatric): Secondary | ICD-10-CM | POA: Diagnosis not present

## 2023-08-07 DIAGNOSIS — I10 Essential (primary) hypertension: Secondary | ICD-10-CM | POA: Diagnosis not present

## 2023-08-07 DIAGNOSIS — I639 Cerebral infarction, unspecified: Secondary | ICD-10-CM

## 2023-08-07 NOTE — Progress Notes (Signed)
 [facility] [address] [place]. [place] [address] [phone]       OFFICE FOLLOW-UP NOTE  Russell Thomas Date of Birth:  Jan 29, 1961 Medical Record Number:  [id number]   HPI: Russell Thomas is a pleasant 63 year old Caucasian male seen today for office follow-up visit following hospital consultation for stroke in November 2024.  History is obtained from the patient and review of electronic medical records.  I have personally reviewed pertinent available imaging films in PACS.  He has past medical history of hyperlipidemia, sleep apnea, prediabetes and diverticulosis. He presented on 12/12/2022 for evaluation for an episode of transient aphasia lasting only 10 seconds followed several hours later with right leg weakness and heaviness and dragging while walking.  Patient was talking to a customer on the phone and felt for about 10 seconds he was unable to find the right words and speak.  He got up from the chair and started walking and then noticed his right leg was heavy and he was dragging it.  This lasted about an hour.  He took a nap and when he woke up he had significantly improved and symptoms resolved.  He went and saw his primary care physician who advised to go to the hospital for evaluation.  CT head on admission showed no acute abnormality.  MRI scan showed 2 tiny punctate acute infarcts involving posterior left frontal cortex.  MR angiogram of the brain showed no large vessel stenosis or occlusion.  Carotid ultrasound showed no significant extracranial stenosis.  2D echo showed ejection fraction of 60 to 65% with mild left ventricular hypertrophy.  Transesophageal echocardiogram showed no cardiac source of embolism but was positive for PFO.  Unfortunately the size of the PFO and presence or absence of atrial septal aneurysm has not been mentioned in the report.  LDL cholesterol was 100 mg percent.  Hemoglobin A1c was 5.8.  Lower extremity venous Dopplers and transcranial  Doppler bubble study were ordered but have not yet been scheduled.  Patient was discharged on dual antiplatelet therapy and is tolerating aspirin  and Plavix  well without bruising or bleeding.  He has not had any recurrent stroke or TIA symptoms.  He states that only occasionally he feels slight heaviness of his right leg particularly when he is tired towards the end of the day.  He does have sleep apnea and has been quite compliant with using his CPAP.  His blood pressure has been slightly elevated and he has increased the dose of losartan  to 50 mg daily which is tolerating well.  He does state that he was under significant stress at the time of the stroke and wonders if this had anything to do with his stroke.  30-day heart monitor has been ordered but he has not received it yet.  Update 08/07/23 SS: Saw Dr. Wonda in Jan 2025, moderate PFO with associated atrial septal aneurysm.  During time of stroke, under significant stress, BP likely high, not taking aspirin , decided to hold off on PFO closure, but would reconsider if recurrent stroke.  Cardiac monitor showed no evidence of A-fib.  Bilateral lower extremity duplex was negative for DVT.  TCD with bubble was consistent with a moderate to large size right-to-left shunt. Dr. Rosemarie did not feel strongly that his PFO needed to be closed. Remains on aspirin  81 mg daily, using CPAP. On Crestor , Losartan  BP 113/81. He feels his memory is not as sharp, doesn't effect him. Working full time, in Airline pilot, Insurance risk surveyor, performs well. More  fatigue since CVA, feels like napping at 5 PM. Has some dull headaches 2/10 x 5 months, on the left side, sometimes take Aleve , sometimes nothing. Labs in June LDL 77, A1C 6.2. MMSE 30/30.  ROS:   14 system review of systems is positive for lightheadedness, leg heaviness, tiredness, headaches all other systems negative  PMH:  Past Medical History:  Diagnosis Date   Adenomatous colon polyp 2008   Dr Debrah, GI   Allergic  rhinitis    Anxiety    Diverticulosis    Gallbladder attack 2015   stones present   Gastritis    Hyperlipemia 2009   IBS (irritable bowel syndrome)    Internal hemorrhoids 2012   banded    Lichen planus    Pre-diabetes    Prostate cancer (HCC)    Seasonal allergies    Sleep apnea    [facility]    Social History:  Social History   Socioeconomic History   Marital status: Married    Spouse name: Not on file   Number of children: 1   Years of education: Not on file   Highest education level: Not on file  Occupational History   Occupation: Investment banker, corporate: [employer]   Tobacco Use   Smoking status: Former    Types: Cigars   Smokeless tobacco: Never   Tobacco comments:     RARE ; <1 -2 cigar / YEAR  Vaping Use   Vaping status: Never Used  Substance and Sexual Activity   Alcohol use: Yes    Comment: rare ; 0-1 drink / month   Drug use: No   Sexual activity: Yes  Other Topics Concern   Not on file  Social History Narrative   Married, lives with spouse Jeryl- Pharm D) and 1 son    Work in Airline pilot   Wears his seatbelt, 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       Social Drivers of Health   Financial Resource Strain: Patient Declined (05/11/2023)   Received from [facility]   Overall Financial Resource Strain (CARDIA)    Difficulty of Paying Living Expenses: Patient declined  Food Insecurity: Patient Declined (05/11/2023)   Received from [facility]   Hunger Vital Sign    Within the past 12 months, you worried that your food would run out before you got the money to buy more.: Patient declined    Within the past 12 months, the food you bought just didn't last and you didn't have money to get more.: Patient declined  Transportation Needs: Unknown (05/11/2023)   Received from [facility] - Transportation    Lack of Transportation (Medical): Patient declined    Lack of Transportation (Non-Medical): Not on file  Physical Activity: Not on file  Stress:  Not on file  Social Connections: Unknown (06/18/2021)   Received from [facility]   Social Network    Social Network: Not on file  Intimate Partner Violence: Not At Risk (12/12/2022)   Humiliation, Afraid, Rape, and Kick questionnaire    Fear of Current or Ex-Partner: No    Emotionally Abused: No    Physically Abused: No    Sexually Abused: No    Medications:   Current Outpatient Medications on File Prior to Visit  Medication Sig Dispense Refill   acetaminophen  (TYLENOL ) 650 MG CR tablet Take 650 mg by mouth every 8 (eight) hours as needed for pain.     aspirin  EC 81 MG tablet Take 1 tablet (  81 mg total) by mouth daily. Swallow whole. 30 tablet 12   Brimonidine  Tartrate (LUMIFY ) 0.025 % SOLN Place 1 drop into both eyes daily.     cyclobenzaprine  (FLEXERIL ) 10 MG tablet 10 mg as needed.     hydrocortisone  cream 1 % Apply 1 Application topically 2 (two) times daily as needed for itching (Rashes).     ibuprofen (ADVIL) 200 MG tablet Take 200 mg by mouth every 6 (six) hours as needed for headache or mild pain (pain score 1-3).     loratadine  (CLARITIN ) 10 MG tablet Take 10 mg by mouth daily.     LORazepam  (ATIVAN ) 1 MG tablet Take 1 mg by mouth at bedtime as needed.     losartan  (COZAAR ) 50 MG tablet Take 50 mg by mouth daily.     mometasone  (NASONEX ) 50 MCG/ACT nasal spray Place 1 spray into the nose as needed (Allergies).     Multiple Vitamins-Minerals (CENTRUM SILVER PO) Take 1 tablet by mouth once a week.     naproxen  sodium (ALEVE ) 220 MG tablet Take 440 mg by mouth daily as needed (Joint Pain).     rosuvastatin  (CRESTOR ) 20 MG tablet Take 1 tablet (20 mg total) by mouth daily. 30 tablet 2   traMADol  (ULTRAM ) 50 MG tablet Take 50 mg by mouth as needed.     traZODone  (DESYREL ) 50 MG tablet Take 1-2 tablets (50-100 mg total) by mouth at bedtime as needed for sleep. 30 tablet 3   triamcinolone (NASACORT ALLERGY  24HR) 55 MCG/ACT AERO nasal inhaler Place 1 spray into the nose as needed  (Seasonal allergies).     No current facility-administered medications on file prior to visit.    Allergies:   Allergies  Allergen Reactions   Cephalosporins Hives and Rash   Ciprofloxacin Rash   Iodinated Contrast Media Hives, Itching and Rash    GADOLINIUM   Omeprazole Rash   Penicillins Itching, Rash and Other (See Comments)    Weezing, blisters Has patient had a PCN reaction causing immediate rash, facial/tongue/throat swelling, SOB or lightheadedness with hypotension: Yes Has patient had a PCN reaction causing severe rash involving mucus membranes or skin necrosis: No Has patient had a PCN reaction that required hospitalization: No Has patient had a PCN reaction occurring within the last 10 years: No If all of the above answers are NO, then may proceed with Cephalosporin use.    Shrimp [Shellfish Allergy ] Rash   Sulfa Antibiotics Hives and Rash   Sulfonamide Derivatives Rash   Omnipaque [Iohexol] Hives and Itching    Scratchy palms, scratchy throat, face and chest redness, hives all over. (CT fluoroscopic Contrast)     Physical Exam  General: The patient is alert and cooperative at the time of the examination.  Skin: No significant peripheral edema is noted.  Neurologic Exam  Mental status: The patient is alert and oriented x 3 at the time of the examination. The patient has apparent normal recent and remote memory, with an apparently normal attention span and concentration ability. MMSE 30/30  Cranial nerves: Facial symmetry is present. Speech is normal, no aphasia or dysarthria is noted. Extraocular movements are full. Visual fields are full.  Motor: The patient has good strength in all 4 extremities.  Sensory examination: Soft touch sensation is symmetric on the face, arms, and legs.  Coordination: The patient has good finger-nose-finger and heel-to-shin bilaterally.  Gait and station: The patient has a normal gait.  Reflexes: Deep tendon reflexes are  symmetric.  08/07/2023    1:00 PM  MMSE - Mini Mental State Exam  Orientation to time 5  Orientation to Place 5  Registration 3  Attention/ Calculation 5  Recall 3  Language- name 2 objects 2  Language- repeat 1  Language- follow 3 step command 3  Language- read & follow direction 1  Write a sentence 1  Copy design 1  Total score 30    ASSESSMENT: 63 year old Caucasian male with embolic left frontal MCA branch infarct of cryptogenic etiology in November 2024.  Vascular risk factors of sleep apnea, hyperlipidemia, PFO and obesity. Saw Dr. Wonda, decided to hold off on PFO closure, continue with medical therapy. 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   - Continue aspirin  81 mg daily for secondary stroke prevention - Mentions dull headache for last several months, offered MRI of the brain, patient wishes to hold off and monitor, let me know if worsens - Has reported some mild memory change since CVA, MMSE 30/30, continue to monitor, remains gainfully employed, independent in all aspects of life - Strict management of vascular risk factors with a goal BP less than 130/90, A1c less than 7.0, LDL less than 70 for secondary stroke prevention - Recommend continue healthy eating, recommend consistent exercise regimen - Plan to follow-up in about 6 to 8 months with Dr. Rosemarie Lauraine Gayland SCHARLENE, DNP  [facility] [address] [place], [place] [address] [phone]

## 2023-08-07 NOTE — Patient Instructions (Signed)
 Great to meet you today.  Continue aspirin  81 mg daily for secondary stroke prevention.  If headaches continue or worsen would recommend pursuing MRI of the brain.  Continue routine follow-up with primary care with strict management of vascular risk factors with a goal BP less than 130/90, A1c less than 7.0, LDL less than 70 for secondary stroke prevention.  Call for any concerns.  Follow-up in 6 months with Dr. Rosemarie. Thanks!!

## 2023-08-08 NOTE — Progress Notes (Signed)
 I agree with the above plan

## 2023-09-16 ENCOUNTER — Telehealth: Payer: Self-pay | Admitting: Neurology

## 2023-09-16 NOTE — Telephone Encounter (Signed)
 Patient left a voicemail on our machine stating he needs to get a new rx for a CPAP machine. Do you want to order him a HST?

## 2023-09-16 NOTE — Telephone Encounter (Signed)
 Pt called and stated that they would like us  to take over their sleep. I told the pt that we need a new referral and the pt stated that they were instructed by pcp to take over. Pt then asked us  to call pcp to get the referral sent over. I will route to our referrals department and see if they can reach out to Dr. Larnell and get referral for sleep since pcp told pt to just ask us  to take over. When I asked pt to simply talk to pcp he got agitated and stated that he already did and that it should be as simple as us  just taking it over. I explained that for any new symptom that we do not currently treat we require referral from pcp. Pt seemed agitated and I told him that I would have our referrals department see what they can do so. What happened is that the provider who was treating for osa retired and the patient assumes we can just take over without referral

## 2023-09-23 NOTE — Telephone Encounter (Signed)
 Received referral from Dr Larnell for management of sleep apnea. Placed in sleep mailbox

## 2023-09-25 NOTE — Telephone Encounter (Signed)
 Referral was declined, Meagan A. Sleep Lab Manager spoke with the patient and he has scheduled an appointment with another Sleep Specialist elsewhere.

## 2023-12-09 ENCOUNTER — Encounter: Payer: Self-pay | Admitting: Radiology

## 2023-12-30 ENCOUNTER — Emergency Department (HOSPITAL_COMMUNITY)

## 2023-12-30 ENCOUNTER — Encounter (HOSPITAL_COMMUNITY): Payer: Self-pay

## 2023-12-30 ENCOUNTER — Emergency Department (HOSPITAL_COMMUNITY)
Admission: EM | Admit: 2023-12-30 | Discharge: 2023-12-30 | Disposition: A | Discharge status: Home / Self Care | Attending: Emergency Medicine | Admitting: Emergency Medicine

## 2023-12-30 DIAGNOSIS — Z8673 Personal history of transient ischemic attack (TIA), and cerebral infarction without residual deficits: Secondary | ICD-10-CM | POA: Insufficient documentation

## 2023-12-30 DIAGNOSIS — Z7982 Long term (current) use of aspirin: Secondary | ICD-10-CM | POA: Diagnosis not present

## 2023-12-30 DIAGNOSIS — R519 Headache, unspecified: Secondary | ICD-10-CM | POA: Diagnosis not present

## 2023-12-30 DIAGNOSIS — R29818 Other symptoms and signs involving the nervous system: Secondary | ICD-10-CM | POA: Diagnosis not present

## 2023-12-30 DIAGNOSIS — I10 Essential (primary) hypertension: Secondary | ICD-10-CM | POA: Diagnosis not present

## 2023-12-30 DIAGNOSIS — R42 Dizziness and giddiness: Secondary | ICD-10-CM | POA: Insufficient documentation

## 2023-12-30 DIAGNOSIS — Z79899 Other long term (current) drug therapy: Secondary | ICD-10-CM | POA: Insufficient documentation

## 2023-12-30 LAB — PROTIME-INR
INR: 1 (ref 0.8–1.2)
Prothrombin Time: 14.2 s (ref 11.4–15.2)

## 2023-12-30 LAB — ETHANOL: Alcohol, Ethyl (B): <15 mg/dL (ref <15)

## 2023-12-30 LAB — CBC
HCT: 46.2 % (ref 39.0–52.0)
Hemoglobin: 15.8 g/dL (ref 13.0–17.0)
MCH: 31 pg (ref 26.0–34.0)
MCHC: 34.2 g/dL (ref 30.0–36.0)
MCV: 90.8 fL (ref 80.0–100.0)
Platelets: 222 K/uL (ref 150–400)
RBC: 5.09 MIL/uL (ref 4.22–5.81)
RDW: 13.1 % (ref 11.5–15.5)
WBC: 9.6 K/uL (ref 4.0–10.5)
nRBC: 0 % (ref 0.0–0.2)

## 2023-12-30 LAB — DIFFERENTIAL
Abs Immature Granulocytes: 0.04 K/uL (ref 0.00–0.07)
Basophils Absolute: 0 K/uL (ref 0.0–0.1)
Basophils Relative: 0 %
Eosinophils Absolute: 0 K/uL (ref 0.0–0.5)
Eosinophils Relative: 0 %
Immature Granulocytes: 0 %
Lymphocytes Relative: 17 %
Lymphs Abs: 1.7 K/uL (ref 0.7–4.0)
Monocytes Absolute: 0.6 K/uL (ref 0.1–1.0)
Monocytes Relative: 6 %
Neutro Abs: 7.3 K/uL (ref 1.7–7.7)
Neutrophils Relative %: 77 %

## 2023-12-30 LAB — COMPREHENSIVE METABOLIC PANEL WITH GFR
ALT: 33 U/L (ref 0–44)
AST: 22 U/L (ref 15–41)
Albumin: 4 g/dL (ref 3.5–5.0)
Alkaline Phosphatase: 63 U/L (ref 38–126)
Anion gap: 9 (ref 5–15)
BUN: 15 mg/dL (ref 8–23)
CO2: 20 mmol/L — ABNORMAL LOW (ref 22–32)
Calcium: 8.9 mg/dL (ref 8.9–10.3)
Chloride: 108 mmol/L (ref 98–111)
Creatinine, Ser: 0.82 mg/dL (ref 0.61–1.24)
GFR, Estimated: >60 mL/min (ref >60)
Glucose, Bld: 146 mg/dL — ABNORMAL HIGH (ref 70–99)
Potassium: 4 mmol/L (ref 3.5–5.1)
Sodium: 137 mmol/L (ref 135–145)
Total Bilirubin: 0.7 mg/dL (ref 0.0–1.2)
Total Protein: 7.3 g/dL (ref 6.5–8.1)

## 2023-12-30 LAB — APTT: aPTT: 28 s (ref 24–36)

## 2023-12-30 MED ORDER — LORAZEPAM 2 MG/ML IJ SOLN
2.0000 mg | Freq: Once | INTRAMUSCULAR | Status: AC | PRN
  Administered 2023-12-30: 2 mg via INTRAVENOUS
  Filled 2023-12-30: qty 1

## 2023-12-30 MED ORDER — MECLIZINE HCL 25 MG PO TABS: 25.0000 mg | ORAL_TABLET | Freq: Three times a day (TID) | ORAL | 0 refills | Status: AC | PRN

## 2023-12-30 MED ORDER — LOSARTAN POTASSIUM 50 MG PO TABS: 100.0000 mg | ORAL_TABLET | Freq: Every day | ORAL | 0 refills | Status: DC

## 2023-12-30 NOTE — ED Provider Notes (Signed)
 [place] EMERGENCY DEPARTMENT AT [facility] Provider Note   CSN: [id number] Arrival date & time: 12/30/23  1041     Patient presents with: Hypertension, Nausea, and Dizziness   Russell Thomas is a 63 y.o. male.   HPI Patient reports that he had elevated blood pressure up to 160s over 110.  Patient experienced significant vertigo symptoms.  He reports that with any movement or position change everything was moving and spinning.  Patient denies he had any syncope or collapse.  Mild paroxysmal headache but no sudden or severe thunderclap headache.  No visual disturbance.  Patient reports he has been able to walk with a coordinated gait.  Patient had prior stroke last year but symptoms are different at that time.    Prior to Admission medications   Medication Sig Start Date End Date Taking? Authorizing Provider  meclizine  (ANTIVERT ) 25 MG tablet Take 1 tablet (25 mg total) by mouth 3 (three) times daily as needed for dizziness. 12/30/23  Yes Armenta Canning, MD  acetaminophen  (TYLENOL ) 650 MG CR tablet Take 650 mg by mouth every 8 (eight) hours as needed for pain.    [provider]  aspirin  EC 81 MG tablet Take 1 tablet (81 mg total) by mouth daily. Swallow whole. 12/14/22   Ghimire, Donalda HERO, MD  Brimonidine  Tartrate (LUMIFY ) 0.025 % SOLN Place 1 drop into both eyes daily.    [provider]  cyclobenzaprine  (FLEXERIL ) 10 MG tablet 10 mg as needed. 01/18/23   [provider]  hydrocortisone  cream 1 % Apply 1 Application topically 2 (two) times daily as needed for itching (Rashes). 12/04/22   [provider]  ibuprofen (ADVIL) 200 MG tablet Take 200 mg by mouth every 6 (six) hours as needed for headache or mild pain (pain score 1-3).    [provider]  loratadine  (CLARITIN ) 10 MG tablet Take 10 mg by mouth daily.    [provider]  LORazepam  (ATIVAN ) 1 MG tablet Take 1 mg by mouth at bedtime as needed. 08/08/22   [provider]  losartan  (COZAAR ) 50 MG tablet Take 2 tablets (100 mg total) by mouth daily. 12/30/23   Armenta Canning, MD  mometasone  (NASONEX ) 50 MCG/ACT nasal spray Place 1 spray into the nose as needed (Allergies).    [provider]  Multiple Vitamins-Minerals (CENTRUM SILVER PO) Take 1 tablet by mouth once a week.    [provider]  naproxen  sodium (ALEVE ) 220 MG tablet Take 440 mg by mouth daily as needed (Joint Pain).    [provider]  rosuvastatin  (CRESTOR ) 20 MG tablet Take 1 tablet (20 mg total) by mouth daily. 12/14/22   Ghimire, Donalda HERO, MD  traMADol  (ULTRAM ) 50 MG tablet Take 50 mg by mouth as needed. 12/04/22   [provider]  traZODone  (DESYREL ) 50 MG tablet Take 1-2 tablets (50-100 mg total) by mouth at bedtime as needed for sleep. 10/16/22   Cyndi Shaver, PA-C  triamcinolone (NASACORT ALLERGY  24HR) 55 MCG/ACT AERO nasal inhaler Place 1 spray into the nose as needed (Seasonal allergies). 12/04/22   [provider]    Allergies: Cephalosporins, Ciprofloxacin, Iodinated contrast media, Omeprazole, Penicillins, Shrimp [shellfish allergy ], Sulfa antibiotics, Sulfonamide derivatives, and Omnipaque [iohexol]    Review of Systems  Updated Vital Signs BP (!) 149/94 (BP Location: Left Arm)   Pulse 96   Temp (!) 97.5 F (36.4 C)   Resp 18   Ht 6' 3 (1.905 m)   [place] ROLLEN)  140.6 kg   SpO2 97%   BMI 38.75 kg/m   Physical Exam Constitutional:      Comments: Alert nontoxic.  Mental status clear.  No respiratory distress.  HENT:     Head: Normocephalic and atraumatic.     Mouth/Throat:     Pharynx: Oropharynx is clear. No oropharyngeal exudate.  Eyes:     Extraocular Movements: Extraocular movements intact.  Cardiovascular:     Rate and Rhythm: Normal rate and regular rhythm.  Pulmonary:     Effort: Pulmonary effort is normal.     Breath sounds: Normal breath sounds.  Abdominal:     General: There is no distension.      Palpations: Abdomen is soft.     Tenderness: There is no abdominal tenderness. There is no guarding.  Musculoskeletal:        General: No swelling or tenderness. Normal range of motion.     Right lower leg: No edema.     Left lower leg: No edema.  Skin:    General: Skin is warm and dry.  Neurological:     General: No focal deficit present.     Mental Status: He is oriented to person, place, and time.     Cranial Nerves: No cranial nerve deficit.     Sensory: No sensory deficit.     Motor: No weakness.     Coordination: Coordination normal.  Psychiatric:        Mood and Affect: Mood normal.     (all labs ordered are listed, but only abnormal results are displayed) Labs Reviewed  COMPREHENSIVE METABOLIC PANEL WITH GFR - Abnormal; Notable for the following components:      Result Value   CO2 20 (*)    Glucose, Bld 146 (*)    All other components within normal limits  PROTIME-INR  APTT  CBC  DIFFERENTIAL  ETHANOL    EKG: EKG Interpretation Date/Time:  Monday December 30 2023 11:40:36 EST Ventricular Rate:  84 PR Interval:  154 QRS Duration:  108 QT Interval:  390 QTC Calculation: 460 R Axis:   -22  Text Interpretation: Normal sinus rhythm Minimal voltage criteria for LVH, may be normal variant ( R in aVL ) Borderline ECG When compared with ECG of 01-Mar-2023 16:16, PREVIOUS ECG IS PRESENT Confirmed by Armenta Canning [id number]) on 12/30/2023 3:13:38 PM  Radiology: MR BRAIN WO CONTRAST Result Date: 12/30/2023 CLINICAL DATA:  Neuro deficit, acute stroke suspected EXAM: MRI HEAD WITHOUT CONTRAST TECHNIQUE: Multiplanar, multiecho pulse sequences of the brain and surrounding structures were obtained without intravenous contrast. COMPARISON:  None Available. FINDINGS: MRI brain: The brain volume is normal. The signal in the brain parenchyma is normal. There is no acute or chronic infarct. The ventricles are normal. No mass lesion. There are normal flow signals in the carotid  arteries and basilar artery. No significant bone marrow signal abnormality. No significant abnormality in the paranasal sinuses or soft tissues. IMPRESSION: Normal Electronically Signed   By: Nancyann Burns M.D.   On: 12/30/2023 15:16     Procedures   Medications Ordered in the ED  LORazepam  (ATIVAN ) injection 2 mg (2 mg Intravenous Given 12/30/23 1419)                                    Medical Decision Making Amount and/or Complexity of Data Reviewed Labs: ordered. Radiology: ordered.  Risk Prescription drug management.   Patient  presents as outlined.  He had onset of significant vertigo.  Patient has history of prior CVA.  At this time he does not have focal deficit.  Patient is clinically well.  Blood pressures were elevated at the time of symptoms.  Currently blood pressures are not significantly elevated in 140s over 90s.  Will proceed with basic lab work to rule out metabolic derangement.  Will proceed with MRI to rule out cerebellar stroke.  Patient does not meet any code stroke criteria.  Labs within normal limits.  MRI normal.  No infarcts identified.  This time findings are most consistent with positional vertigo with MRI showing no evidence of infarct.  Patient otherwise stable for continued home management.  Blood pressures are controlled at 140s over 90s.  Will recommend patient increase his Cozaar  dose to 100 mg a day.  Return precautions and follow-up plan reviewed.       Final diagnoses:  Vertigo  Hypertension, unspecified type    ED Discharge Orders          Ordered    meclizine  (ANTIVERT ) 25 MG tablet  3 times daily PRN        12/30/23 1543    losartan  (COZAAR ) 50 MG tablet  Daily        12/30/23 1544               Armenta Canning, MD 12/30/23 1545

## 2023-12-30 NOTE — ED Notes (Signed)
 Pt was discharged with wife  home. Discharge instructions were given. Verbalized understanding.

## 2023-12-30 NOTE — ED Triage Notes (Signed)
 Pt c/o dizziness starting last night.  Sts dizziness is worse with movement.  Bilateral rash on upper abdomen x3 weeks and has been taking a cream prescribed by a dermatologist.  Also, intermittent sharp R flank pain x a few weeks.

## 2023-12-30 NOTE — ED Triage Notes (Signed)
 Pt states he started feeling dizzy last night and his BP was 170/110 which is high for him. Pt took losartan  and ASA. This morning lightheadedness returned and feels like he is going to pass out. Pt has had nausea, denies vomiting. Pt c/o pain in right flank and feeling bloated that started a few weeks ago.

## 2023-12-30 NOTE — Discharge Instructions (Addendum)
 1.  Increase your Cozaar  dose to 100 mg daily. 2.  You may take Antivert  if needed for vertiginous dizzy symptoms 3.  Follow-up with your doctor for recheck next week. 4.  Your MRI was normal today.  Return if you have any other concerning symptoms.

## 2024-03-11 ENCOUNTER — Ambulatory Visit (HOSPITAL_COMMUNITY)
Admission: RE | Admit: 2024-03-11 | Discharge: 2024-03-11 | Disposition: A | Payer: Self-pay | Source: Ambulatory Visit | Attending: Cardiovascular Disease | Admitting: Cardiovascular Disease

## 2024-03-11 ENCOUNTER — Ambulatory Visit: Admitting: Cardiovascular Disease

## 2024-03-11 ENCOUNTER — Encounter: Payer: Self-pay | Admitting: Cardiovascular Disease

## 2024-03-11 VITALS — BP 126/82 | HR 96 | Ht 73.0 in | Wt 318.4 lb

## 2024-03-11 DIAGNOSIS — E782 Mixed hyperlipidemia: Secondary | ICD-10-CM

## 2024-03-11 DIAGNOSIS — Q2112 Patent foramen ovale: Secondary | ICD-10-CM

## 2024-03-11 DIAGNOSIS — I10 Essential (primary) hypertension: Secondary | ICD-10-CM

## 2024-03-11 DIAGNOSIS — I253 Aneurysm of heart: Secondary | ICD-10-CM | POA: Diagnosis not present

## 2024-03-11 MED ORDER — EZETIMIBE 10 MG PO TABS: 10.0000 mg | ORAL_TABLET | Freq: Every day | ORAL | 3 refills | Status: AC

## 2024-03-11 MED ORDER — LOSARTAN POTASSIUM 50 MG PO TABS: 100.0000 mg | ORAL_TABLET | Freq: Every day | ORAL | 3 refills | Status: AC

## 2024-03-11 NOTE — Assessment & Plan Note (Addendum)
 LDL cholesterol most recently was 72 mg/dL.  Goal LDL is less than 70.  However, his inflammatory markers are high with an elevated CRP, LDL particle number is high, and LP(a) is moderately elevated.  I recommended addition of ezetimibe  10 mg daily with a follow-up lipo med panel in 3 to 4 months.  We had a lengthy discussion about diet and weight loss efforts as I suspect this is the best way to reduce inflammation and improve his risk profile.  We talked specifics about adequate protein intake, portion control, reduction in simple sugars and starches, etc.

## 2024-03-11 NOTE — Assessment & Plan Note (Addendum)
 Blood pressure controlled on losartan .  Lengthy discussion about weight management, diet, and exercise.

## 2024-03-11 NOTE — Progress Notes (Signed)
 " Cardiology Office Note:    Date:  03/11/2024   ID:  Russell Thomas, DOB 10/17/1960, MRN [id number]  PCP:  Larnell Hamilton, MD   [facility] Providers Cardiologist:  Ozell Fell, MD     Referring MD: Larnell Hamilton, MD   Chief Complaint  Patient presents with   Follow-up    PFO    History of Present Illness:    Russell Thomas is a 64 y.o. male with a hx of PFO, presenting for follow-up evaluation.  I saw the patient 1 year ago in consultation for device closure for treatment of PFO.  He had a history of cryptogenic stroke.  He was found to have a moderate to large PFO with associated atrial septal aneurysm.  He was noted to have comorbid conditions of hypertension and morbid obesity.  The patient elected to continue with medical therapy and he presents today for follow-up evaluation.  The patient is here alone today. He asks to have a coronary Ca score CT performed. A few months ago, he moved his head suddenly and developed severe vertigo symptoms. He went to the ER the next day and an MRI showed no evidence of stroke. He was diagnosed with vertigo. However, he continues to experience intermittent lightheadedness.   He recently underwent advanced lipid testing with the following results: LDL particle number 1838, LP(a) 109, Apo B 73, HDL 63, LDL 72 mg/dL, total chol 847, Trig 85, HgB A1C 6.3  He otherwise is doing well with no episodes of chest pain, chest pressure, shortness of breath, or leg swelling.  He is moderately active but not engaged in any regular exercise at present.  Current Medications: Active Medications[1]   Allergies:   Cephalosporins, Ciprofloxacin, Iodinated contrast media, Omeprazole, Penicillins, Shrimp [shellfish allergy ], Sulfa antibiotics, Sulfonamide derivatives, and Omnipaque [iohexol]   ROS:   Please see the history of present illness.    All other systems reviewed and are negative.  EKGs/Labs/Other Studies Reviewed:    The following  studies were reviewed today: Cardiac Studies & Procedures   ______________________________________________________________________________________________     ECHOCARDIOGRAM  ECHOCARDIOGRAM COMPLETE 12/12/2022  Narrative ECHOCARDIOGRAM REPORT    Patient Name:   Russell Thomas Date of Exam: 12/12/2022 Medical Rec #:  [id number]       Height:       73.0 in Accession #:    [id number]      Weight:       313.9 lb Date of Birth:  [birth year]       BSA:          2.606 m Patient Age:    64 years        BP:           125/64 mmHg Patient Gender: M               HR:           90 bpm. Exam Location:  Inpatient  Procedure: 2D Echo, Cardiac Doppler and Color Doppler  Indications:    TIA  History:        Patient has prior history of Echocardiogram examinations, most recent 04/23/2013. Stroke; Risk Factors:Hypertension and Sleep Apnea.  Sonographer:    Ozell Free Referring Phys: [id number] TIMOTHY S OPYD   Sonographer Comments: Patient is obese. IMPRESSIONS   1. Technically difficult study. Left ventricular ejection fraction, by estimation, is 60 to 65%. The left ventricle has normal function. Left ventricular endocardial border not optimally defined to evaluate regional  wall motion. There is mild left ventricular hypertrophy. Left ventricular diastolic parameters were normal. 2. Right ventricular systolic function is normal. The right ventricular size is normal. Tricuspid regurgitation signal is inadequate for assessing PA pressure. 3. The mitral valve is normal in structure. Trivial mitral valve regurgitation. 4. The aortic valve was not well visualized. Aortic valve regurgitation is not visualized. No aortic stenosis is present.  FINDINGS Left Ventricle: Left ventricular ejection fraction, by estimation, is 60 to 65%. The left ventricle has normal function. Left ventricular endocardial border not optimally defined to evaluate regional wall motion. The left ventricular internal cavity size  was normal in size. There is mild left ventricular hypertrophy. Left ventricular diastolic parameters were normal.  Right Ventricle: The right ventricular size is normal. Right vetricular wall thickness was not well visualized. Right ventricular systolic function is normal. Tricuspid regurgitation signal is inadequate for assessing PA pressure.  Left Atrium: Left atrial size was normal in size.  Right Atrium: Right atrial size was normal in size.  Pericardium: There is no evidence of pericardial effusion. Presence of epicardial fat layer.  Mitral Valve: The mitral valve is normal in structure. Trivial mitral valve regurgitation.  Tricuspid Valve: The tricuspid valve is normal in structure. Tricuspid valve regurgitation is trivial.  Aortic Valve: The aortic valve was not well visualized. Aortic valve regurgitation is not visualized. No aortic stenosis is present. Aortic valve mean gradient measures 5.0 mmHg. Aortic valve peak gradient measures 8.8 mmHg. Aortic valve area, by VTI measures 3.24 cm.  Pulmonic Valve: The pulmonic valve was not well visualized. Pulmonic valve regurgitation is not visualized.  Aorta: The aortic root and ascending aorta are structurally normal, with no evidence of dilitation.  IAS/Shunts: The interatrial septum was not well visualized.   LEFT VENTRICLE PLAX 2D LVIDd:         4.90 cm   Diastology LVIDs:         3.40 cm   LV e' medial:    6.96 cm/s LV PW:         1.10 cm   LV E/e' medial:  8.4 LV IVS:        1.10 cm   LV e' lateral:   13.40 cm/s LVOT diam:     2.20 cm   LV E/e' lateral: 4.4 LV SV:         79 LV SV Index:   30 LVOT Area:     3.80 cm   RIGHT VENTRICLE RV Basal diam:  3.70 cm RV S prime:     16.40 cm/s TAPSE (M-mode): 2.3 cm  LEFT ATRIUM             Index        RIGHT ATRIUM           Index LA diam:        3.90 cm 1.50 cm/m   RA Area:     14.10 cm LA Vol (A2C):   60.1 ml 23.06 ml/m  RA Volume:   28.10 ml  10.78 ml/m LA Vol (A4C):    47.7 ml 18.30 ml/m LA Biplane Vol: 55.7 ml 21.37 ml/m AORTIC VALVE AV Area (Vmax):    3.06 cm AV Area (Vmean):   2.93 cm AV Area (VTI):     3.24 cm AV Vmax:           148.00 cm/s AV Vmean:          97.500 cm/s AV VTI:  0.244 m AV Peak Grad:      8.8 mmHg AV Mean Grad:      5.0 mmHg LVOT Vmax:         119.00 cm/s LVOT Vmean:        75.100 cm/s LVOT VTI:          0.208 m LVOT/AV VTI ratio: 0.85  AORTA Ao Root diam: 3.30 cm Ao Asc diam:  3.70 cm  MITRAL VALVE MV Area (PHT): 4.83 cm    SHUNTS MV Decel Time: 157 msec    Systemic VTI:  0.21 m MV E velocity: 58.30 cm/s  Systemic Diam: 2.20 cm MV A velocity: 81.80 cm/s MV E/A ratio:  0.71  Lonni Nanas MD Electronically signed by Lonni Nanas MD Signature Date/Time: 12/12/2022/5:06:44 PM    Final   TEE  ECHO TEE 12/13/2022  Narrative TRANSESOPHOGEAL ECHO REPORT    Patient Name:   Russell Thomas Date of Exam: 12/13/2022 Medical Rec #:  [id number]       Height:       73.0 in Accession #:    [id number]      Weight:       313.9 lb Date of Birth:  Aug 15, 1960       BSA:          2.606 m Patient Age:    64 years        BP:           162/107 mmHg Patient Gender: M               HR:           86 bpm. Exam Location:  Inpatient  Procedure: Transesophageal Echo, Cardiac Doppler and Color Doppler  Indications:    PFO  History:        Patient has prior history of Echocardiogram examinations.  Sonographer:    Tillman Nora RVT RCS Referring Phys: [id number] ARTIST POUCH  PROCEDURE: The transesophogeal probe was passed without difficulty through the esophogus of the patient. Sedation performed by different physician. The patient's vital signs; including heart rate, blood pressure, and oxygen  saturation; remained stable throughout the procedure. Supplementary images were obtained from transthoracic windows as indicated to answer the clinical question. The patient developed no complications during the  procedure.  IMPRESSIONS   1. Left ventricular ejection fraction, by estimation, is 60 to 65%. The left ventricle has normal function. The left ventricle has no regional wall motion abnormalities. 2. Right ventricular systolic function is normal. The right ventricular size is normal. 3. No left atrial/left atrial appendage thrombus was detected. The LAA emptying velocity was 44 cm/s. 4. The mitral valve is normal in structure. Trivial mitral valve regurgitation. No evidence of mitral stenosis. 5. The aortic valve is tricuspid. Aortic valve regurgitation is mild. No aortic stenosis is present. 6. There is mild dilatation of the ascending aorta, measuring 40 mm. 7. Evidence of atrial level shunting detected by color flow Doppler. Agitated saline contrast bubble study was positive with shunting observed within 3-6 cardiac cycles suggestive of interatrial shunt.  Conclusion(s)/Recommendation(s): Findings are concerning for an interatrial shunt as detailed above.  FINDINGS Left Ventricle: Left ventricular ejection fraction, by estimation, is 60 to 65%. The left ventricle has normal function. The left ventricle has no regional wall motion abnormalities. The left ventricular internal cavity size was normal in size. There is no left ventricular hypertrophy.  Right Ventricle: The right ventricular size is normal. Right vetricular wall thickness was not assessed. Right  ventricular systolic function is normal.  Left Atrium: Left atrial size was normal in size. No left atrial/left atrial appendage thrombus was detected. The LAA emptying velocity was 44 cm/s.  Right Atrium: Right atrial size was normal in size.  Pericardium: There is no evidence of pericardial effusion.  Mitral Valve: The mitral valve is normal in structure. Trivial mitral valve regurgitation. No evidence of mitral valve stenosis.  Tricuspid Valve: The tricuspid valve is normal in structure. Tricuspid valve regurgitation is not  demonstrated. No evidence of tricuspid stenosis.  Aortic Valve: The aortic valve is tricuspid. Aortic valve regurgitation is mild. No aortic stenosis is present.  Pulmonic Valve: The pulmonic valve was normal in structure. Pulmonic valve regurgitation is not visualized. No evidence of pulmonic stenosis.  Aorta: The aortic root is normal in size and structure. There is mild dilatation of the ascending aorta, measuring 40 mm.  Venous: The right upper pulmonary vein, right lower pulmonary vein and left upper pulmonary vein are normal.  IAS/Shunts: There is redundancy of the interatrial septum. Evidence of atrial level shunting detected by color flow Doppler. Agitated saline contrast was given intravenously to evaluate for intracardiac shunting. Agitated saline contrast bubble study was positive with shunting observed within 3-6 cardiac cycles suggestive of interatrial shunt.  Additional Comments: Spectral Doppler performed.  LEFT VENTRICLE PLAX 2D LVOT diam:     2.50 cm LVOT Area:     4.91 cm    AORTA Ao Root diam: 3.60 cm Ao Asc diam:  4.00 cm   SHUNTS Systemic Diam: 2.50 cm  Sunit Tolia Electronically signed by Madonna Large Signature Date/Time: 12/13/2022/3:53:22 PM    Final  MONITORS  CARDIAC EVENT MONITOR 02/04/2023  Narrative Sinus rhythm with minimal atrial/ventricular ectopy.  No significant tachyarrhythmia or bradyarrhythmia. No atrial fibrillation or flutter.       ______________________________________________________________________________________________      EKG:   EKG Interpretation Date/Time:  Wednesday March 11 2024 14:28:48 EST Ventricular Rate:  96 PR Interval:  154 QRS Duration:  100 QT Interval:  356 QTC Calculation: 449 R Axis:   -28  Text Interpretation: Normal sinus rhythm Minimal voltage criteria for LVH, may be normal variant ( R in aVL ) When compared with ECG of 30-Dec-2023 11:40, No significant change was found Confirmed by  Wonda Sharper [phone]) on 03/11/2024 2:33:35 PM    Recent Labs: 12/30/2023: ALT 33; BUN 15; Creatinine, Ser 0.82; Hemoglobin 15.8; Platelets 222; Potassium 4.0; Sodium 137  Recent Lipid Panel    Component Value Date/Time   CHOL 119 03/06/2023 1326   TRIG 114 03/06/2023 1326   HDL 41 03/06/2023 1326   CHOLHDL 2.9 03/06/2023 1326   CHOLHDL 4.0 12/12/2022 0156   VLDL 30 12/12/2022 0156   LDLCALC 57 03/06/2023 1326     Risk Assessment/Calculations:                Physical Exam:    VS:  BP 126/82 (BP Location: Left Arm, Patient Position: Sitting)   Pulse 96   Ht 6' 1 (1.854 m)   Wt (!) 318 lb 6.4 oz (144.4 kg)   SpO2 97%   BMI 42.01 kg/m     Wt Readings from Last 3 Encounters:  03/11/24 (!) 318 lb 6.4 oz (144.4 kg)  12/30/23 (!) 310 lb (140.6 kg)  03/01/23 (!) 310 lb (140.6 kg)     GEN:  Well nourished, well developed, pleasant obese male, in no acute distress HEENT: Normal NECK: No JVD; No carotid bruits LYMPHATICS: No  lymphadenopathy CARDIAC: RRR, no murmurs, rubs, gallops RESPIRATORY:  Clear to auscultation without rales, wheezing or rhonchi  ABDOMEN: Soft, non-tender, non-distended MUSCULOSKELETAL:  No edema; No deformity  SKIN: Warm and dry NEUROLOGIC:  Alert and oriented x 3 PSYCHIATRIC:  Normal affect   Assessment & Plan PFO with atrial septal aneurysm Stable, no recurrent episodes to suggest stroke or TIA.  MRI brain from recent evaluation reviewed and is completely normal.  Continue aspirin  81 mg daily. Primary hypertension Blood pressure controlled on losartan .  Lengthy discussion about weight management, diet, and exercise. Hyperlipidemia, mixed LDL cholesterol most recently was 72 mg/dL.  Goal LDL is less than 70.  However, his inflammatory markers are high with an elevated CRP, LDL particle number is high, and LP(a) is moderately elevated.  I recommended addition of ezetimibe  10 mg daily with a follow-up lipo med panel in 3 to 4 months.  We had a  lengthy discussion about diet and weight loss efforts as I suspect this is the best way to reduce inflammation and improve his risk profile.  We talked specifics about adequate protein intake, portion control, reduction in simple sugars and starches, etc.     Medication Adjustments/Labs and Tests Ordered: Current medicines are reviewed at length with the patient today.  Concerns regarding medicines are outlined above.  Orders Placed This Encounter  Procedures   CT CARDIAC SCORING (SELF PAY ONLY)   NMR, lipoprofile   Hepatic function panel   Ambulatory referral to Family Practice   EKG 12-Lead   Meds ordered this encounter  Medications   losartan  (COZAAR ) 50 MG tablet    Sig: Take 2 tablets (100 mg total) by mouth daily.    Dispense:  180 tablet    Refill:  3   ezetimibe  (ZETIA ) 10 MG tablet    Sig: Take 1 tablet (10 mg total) by mouth daily.    Dispense:  30 tablet    Refill:  3    Patient Instructions  Medication Instructions:  START Ezetimibe  (Zetia ) 10 mg once daily  *If you need a refill on your cardiac medications before your next appointment, please call your pharmacy*  Lab Work: To be completed tomorrow: NMR lipoprofile and LFT  If you have labs (blood work) drawn today and your tests are completely normal, you will receive your results only by: MyChart Message (if you have MyChart) OR A paper copy in the mail If you have any lab test that is abnormal or we need to change your treatment, we will call you to review the results.  Testing/Procedures: Your physician has requested that you have a coronary calcium  score performed. This is not covered by insurance and will be an out-of-pocket cost of approximately $99.   You have been referred to [facility]. Their office will be calling to schedule this appointment.   Follow-Up: At [facility], you and your health needs are our priority.  As part of our continuing mission to provide  you with exceptional heart care, our providers are all part of one team.  This team includes your primary Cardiologist (physician) and Advanced Practice Providers or APPs (Physician Assistants and Nurse Practitioners) who all work together to provide you with the care you need, when you need it.  Your next appointment:   1 year(s)  Provider:   Ozell Fell, MD     Signed, Ozell Fell, MD  03/11/2024 4:08 PM    [facility]     [1]  Current Meds  Medication Sig   acetaminophen  (TYLENOL ) 650 MG CR tablet Take 650 mg by mouth every 8 (eight) hours as needed for pain.   aspirin  EC 81 MG tablet Take 1 tablet (81 mg total) by mouth daily. Swallow whole.   Brimonidine  Tartrate (LUMIFY ) 0.025 % SOLN Place 1 drop into both eyes daily.   cyclobenzaprine  (FLEXERIL ) 10 MG tablet 10 mg as needed.   ezetimibe  (ZETIA ) 10 MG tablet Take 1 tablet (10 mg total) by mouth daily.   hydrocortisone  cream 1 % Apply 1 Application topically 2 (two) times daily as needed for itching (Rashes).   ibuprofen (ADVIL) 200 MG tablet Take 200 mg by mouth every 6 (six) hours as needed for headache or mild pain (pain score 1-3).   loratadine  (CLARITIN ) 10 MG tablet Take 10 mg by mouth daily.   LORazepam  (ATIVAN ) 1 MG tablet Take 1 mg by mouth at bedtime as needed.   meclizine  (ANTIVERT ) 25 MG tablet Take 1 tablet (25 mg total) by mouth 3 (three) times daily as needed for dizziness.   mometasone  (NASONEX ) 50 MCG/ACT nasal spray Place 1 spray into the nose as needed (Allergies).   Multiple Vitamins-Minerals (CENTRUM SILVER PO) Take 1 tablet by mouth once a week.   naproxen  sodium (ALEVE ) 220 MG tablet Take 440 mg by mouth daily as needed (Joint Pain).   rosuvastatin  (CRESTOR ) 20 MG tablet Take 1 tablet (20 mg total) by mouth daily.   [DISCONTINUED] losartan  (COZAAR ) 50 MG tablet Take 2 tablets (100 mg total) by mouth daily.   "

## 2024-03-11 NOTE — Patient Instructions (Signed)
 Medication Instructions:  START Ezetimibe  (Zetia ) 10 mg once daily  *If you need a refill on your cardiac medications before your next appointment, please call your pharmacy*  Lab Work: To be completed tomorrow: NMR lipoprofile and LFT  If you have labs (blood work) drawn today and your tests are completely normal, you will receive your results only by: MyChart Message (if you have MyChart) OR A paper copy in the mail If you have any lab test that is abnormal or we need to change your treatment, we will call you to review the results.  Testing/Procedures: Your physician has requested that you have a coronary calcium  score performed. This is not covered by insurance and will be an out-of-pocket cost of approximately $99.   You have been referred to [facility]. Their office will be calling to schedule this appointment.   Follow-Up: At [facility], you and your health needs are our priority.  As part of our continuing mission to provide you with exceptional heart care, our providers are all part of one team.  This team includes your primary Cardiologist (physician) and Advanced Practice Providers or APPs (Physician Assistants and Nurse Practitioners) who all work together to provide you with the care you need, when you need it.  Your next appointment:   1 year(s)  Provider:   Ozell Fell, MD

## 2024-03-12 ENCOUNTER — Telehealth: Payer: Self-pay | Admitting: Cardiovascular Disease

## 2024-03-12 ENCOUNTER — Encounter (INDEPENDENT_AMBULATORY_CARE_PROVIDER_SITE_OTHER): Payer: Self-pay

## 2024-03-12 ENCOUNTER — Ambulatory Visit: Payer: Self-pay | Admitting: Cardiovascular Disease

## 2024-03-12 NOTE — Telephone Encounter (Signed)
 Patient stated he will be going out of town all of next week, back on 2/16.  Patient wants to get revised instructions for his lab work.

## 2024-03-12 NOTE — Telephone Encounter (Signed)
 Spoke with pt and explained that I had mistakenly put to be completed tomorrow on the AVS for lab work however the dates written on the pt's lab requisition print out is correct and he should go between 06/08/24-07/09/24 to have labs completed. Pt verbalized understanding of plan and had no further questions at this time.

## 2024-03-12 NOTE — Telephone Encounter (Signed)
 Called pt to address concern regarding lab work.  Pt is questioning when lab work is needed.  Was told would need lab work in 3-4 months after starting Zetia  10 mg.  However, was told to have labs drawn today.  Expresses can't have labs drawn until week of 16th d/t going out of town.  Would like clarification as to when labs are needed.   Advised will send to nurse to f/u.

## 2024-03-16 ENCOUNTER — Ambulatory Visit: Admitting: Cardiovascular Disease

## 2024-03-18 ENCOUNTER — Ambulatory Visit (HOSPITAL_COMMUNITY): Payer: Self-pay

## 2024-06-02 ENCOUNTER — Ambulatory Visit: Admitting: Neurology
